# Patient Record
Sex: Female | Born: 1950
Health system: Southern US, Community
[De-identification: ages and names within clinical notes are randomized; demographics above are authoritative.]

## PROBLEM LIST (undated history)

## (undated) DIAGNOSIS — M1990 Unspecified osteoarthritis, unspecified site: Secondary | ICD-10-CM

## (undated) DIAGNOSIS — Z87440 Personal history of urinary (tract) infections: Secondary | ICD-10-CM

## (undated) DIAGNOSIS — Z87442 Personal history of urinary calculi: Secondary | ICD-10-CM

## (undated) DIAGNOSIS — Z1371 Encounter for nonprocreative screening for genetic disease carrier status: Secondary | ICD-10-CM

## (undated) DIAGNOSIS — C801 Malignant (primary) neoplasm, unspecified: Secondary | ICD-10-CM

## (undated) HISTORY — PX: REPAIR, ANAL SPHINCTER: SHX5192

## (undated) HISTORY — PX: BLADDER SUSPENSION: SHX72

## (undated) HISTORY — PX: HYSTERECTOMY: SHX81

## (undated) HISTORY — PX: BREAST BIOPSY: SHX20

## (undated) HISTORY — PX: BREAST CYST ASPIRATION: SHX578

## (undated) HISTORY — PX: COLONOSCOPY: SHX174

---

## 1997-05-25 ENCOUNTER — Ambulatory Visit (HOSPITAL_COMMUNITY): Admission: RE | Admit: 1997-05-25 | Discharge: 1997-05-25 | Payer: Self-pay | Admitting: Family Medicine

## 1997-06-07 ENCOUNTER — Ambulatory Visit (HOSPITAL_COMMUNITY): Admission: RE | Admit: 1997-06-07 | Discharge: 1997-06-07 | Payer: Self-pay | Admitting: Family Medicine

## 1998-02-03 ENCOUNTER — Ambulatory Visit (HOSPITAL_COMMUNITY): Admission: RE | Admit: 1998-02-03 | Discharge: 1998-02-03 | Payer: Self-pay | Admitting: Family Medicine

## 1999-08-22 ENCOUNTER — Encounter (INDEPENDENT_AMBULATORY_CARE_PROVIDER_SITE_OTHER): Payer: Self-pay | Admitting: Specialist

## 1999-08-22 ENCOUNTER — Other Ambulatory Visit: Admission: RE | Admit: 1999-08-22 | Discharge: 1999-08-22 | Payer: Self-pay | Admitting: Obstetrics and Gynecology

## 1999-08-29 ENCOUNTER — Encounter: Payer: Self-pay | Admitting: Family Medicine

## 1999-08-29 ENCOUNTER — Ambulatory Visit (HOSPITAL_COMMUNITY): Admission: RE | Admit: 1999-08-29 | Discharge: 1999-08-29 | Payer: Self-pay | Admitting: Family Medicine

## 1999-12-29 ENCOUNTER — Encounter (INDEPENDENT_AMBULATORY_CARE_PROVIDER_SITE_OTHER): Payer: Self-pay

## 1999-12-29 ENCOUNTER — Ambulatory Visit (HOSPITAL_BASED_OUTPATIENT_CLINIC_OR_DEPARTMENT_OTHER): Admission: RE | Admit: 1999-12-29 | Discharge: 1999-12-29 | Payer: Self-pay | Admitting: *Deleted

## 2000-09-05 ENCOUNTER — Ambulatory Visit (HOSPITAL_COMMUNITY): Admission: RE | Admit: 2000-09-05 | Discharge: 2000-09-05 | Payer: Self-pay | Admitting: Pediatrics

## 2000-09-05 ENCOUNTER — Encounter: Payer: Self-pay | Admitting: Family Medicine

## 2000-10-02 ENCOUNTER — Other Ambulatory Visit: Admission: RE | Admit: 2000-10-02 | Discharge: 2000-10-02 | Payer: Self-pay | Admitting: Obstetrics and Gynecology

## 2001-10-01 ENCOUNTER — Other Ambulatory Visit: Admission: RE | Admit: 2001-10-01 | Discharge: 2001-10-01 | Payer: Self-pay | Admitting: Obstetrics and Gynecology

## 2001-10-08 ENCOUNTER — Encounter: Payer: Self-pay | Admitting: Obstetrics and Gynecology

## 2001-10-08 ENCOUNTER — Ambulatory Visit (HOSPITAL_COMMUNITY): Admission: RE | Admit: 2001-10-08 | Discharge: 2001-10-08 | Payer: Self-pay | Admitting: Obstetrics and Gynecology

## 2002-11-03 ENCOUNTER — Ambulatory Visit (HOSPITAL_COMMUNITY): Admission: RE | Admit: 2002-11-03 | Discharge: 2002-11-03 | Payer: Self-pay | Admitting: Obstetrics and Gynecology

## 2002-11-03 ENCOUNTER — Encounter: Payer: Self-pay | Admitting: Obstetrics and Gynecology

## 2002-11-06 ENCOUNTER — Encounter: Admission: RE | Admit: 2002-11-06 | Discharge: 2002-11-06 | Payer: Self-pay | Admitting: Obstetrics and Gynecology

## 2002-11-06 ENCOUNTER — Encounter: Payer: Self-pay | Admitting: Obstetrics and Gynecology

## 2002-11-19 ENCOUNTER — Other Ambulatory Visit: Admission: RE | Admit: 2002-11-19 | Discharge: 2002-11-19 | Payer: Self-pay | Admitting: Obstetrics and Gynecology

## 2003-03-15 ENCOUNTER — Other Ambulatory Visit: Admission: RE | Admit: 2003-03-15 | Discharge: 2003-03-15 | Payer: Self-pay | Admitting: Obstetrics and Gynecology

## 2003-04-20 ENCOUNTER — Encounter: Admission: RE | Admit: 2003-04-20 | Discharge: 2003-04-20 | Payer: Self-pay | Admitting: Obstetrics and Gynecology

## 2003-11-26 ENCOUNTER — Other Ambulatory Visit: Admission: RE | Admit: 2003-11-26 | Discharge: 2003-11-26 | Payer: Self-pay | Admitting: Obstetrics and Gynecology

## 2003-12-01 ENCOUNTER — Encounter: Admission: RE | Admit: 2003-12-01 | Discharge: 2003-12-01 | Payer: Self-pay | Admitting: Obstetrics and Gynecology

## 2004-12-08 ENCOUNTER — Encounter: Admission: RE | Admit: 2004-12-08 | Discharge: 2004-12-08 | Payer: Self-pay | Admitting: Family Medicine

## 2005-01-08 ENCOUNTER — Other Ambulatory Visit: Admission: RE | Admit: 2005-01-08 | Discharge: 2005-01-08 | Payer: Self-pay | Admitting: Obstetrics and Gynecology

## 2005-01-19 ENCOUNTER — Encounter: Admission: RE | Admit: 2005-01-19 | Discharge: 2005-01-19 | Payer: Self-pay | Admitting: Obstetrics and Gynecology

## 2005-01-20 ENCOUNTER — Emergency Department (HOSPITAL_COMMUNITY): Admission: EM | Admit: 2005-01-20 | Discharge: 2005-01-20 | Payer: Self-pay | Admitting: Emergency Medicine

## 2005-12-11 ENCOUNTER — Encounter: Admission: RE | Admit: 2005-12-11 | Discharge: 2005-12-11 | Payer: Self-pay | Admitting: Family Medicine

## 2006-02-07 ENCOUNTER — Other Ambulatory Visit: Admission: RE | Admit: 2006-02-07 | Discharge: 2006-02-07 | Payer: Self-pay | Admitting: Obstetrics and Gynecology

## 2006-12-19 ENCOUNTER — Encounter: Admission: RE | Admit: 2006-12-19 | Discharge: 2006-12-19 | Payer: Self-pay | Admitting: Obstetrics and Gynecology

## 2007-01-01 ENCOUNTER — Encounter: Admission: RE | Admit: 2007-01-01 | Discharge: 2007-01-01 | Payer: Self-pay | Admitting: Obstetrics & Gynecology

## 2007-04-25 ENCOUNTER — Other Ambulatory Visit: Admission: RE | Admit: 2007-04-25 | Discharge: 2007-04-25 | Payer: Self-pay | Admitting: Obstetrics & Gynecology

## 2007-05-13 ENCOUNTER — Encounter: Admission: RE | Admit: 2007-05-13 | Discharge: 2007-05-13 | Payer: Self-pay | Admitting: Obstetrics & Gynecology

## 2007-07-17 ENCOUNTER — Encounter: Admission: RE | Admit: 2007-07-17 | Discharge: 2007-07-17 | Payer: Self-pay | Admitting: Obstetrics and Gynecology

## 2008-01-26 ENCOUNTER — Encounter: Admission: RE | Admit: 2008-01-26 | Discharge: 2008-01-26 | Payer: Self-pay | Admitting: Obstetrics and Gynecology

## 2008-02-04 ENCOUNTER — Encounter: Admission: RE | Admit: 2008-02-04 | Discharge: 2008-02-04 | Payer: Self-pay | Admitting: Obstetrics and Gynecology

## 2008-06-01 ENCOUNTER — Other Ambulatory Visit: Admission: RE | Admit: 2008-06-01 | Discharge: 2008-06-01 | Payer: Self-pay | Admitting: Obstetrics and Gynecology

## 2008-06-04 ENCOUNTER — Encounter: Admission: RE | Admit: 2008-06-04 | Discharge: 2008-06-04 | Payer: Self-pay | Admitting: Obstetrics & Gynecology

## 2009-07-25 ENCOUNTER — Encounter: Admission: RE | Admit: 2009-07-25 | Discharge: 2009-07-25 | Payer: Self-pay | Admitting: Obstetrics & Gynecology

## 2010-05-07 ENCOUNTER — Encounter: Payer: Self-pay | Admitting: Obstetrics and Gynecology

## 2010-05-08 ENCOUNTER — Encounter: Payer: Self-pay | Admitting: Obstetrics and Gynecology

## 2010-08-03 ENCOUNTER — Other Ambulatory Visit: Payer: Self-pay | Admitting: Obstetrics and Gynecology

## 2010-08-03 DIAGNOSIS — Z1231 Encounter for screening mammogram for malignant neoplasm of breast: Secondary | ICD-10-CM

## 2010-08-11 ENCOUNTER — Ambulatory Visit
Admission: RE | Admit: 2010-08-11 | Discharge: 2010-08-11 | Disposition: A | Discharge status: Home / Self Care | Payer: BC Managed Care – PPO | Source: Ambulatory Visit | Attending: Obstetrics and Gynecology | Admitting: Obstetrics and Gynecology

## 2010-08-11 DIAGNOSIS — Z1231 Encounter for screening mammogram for malignant neoplasm of breast: Secondary | ICD-10-CM

## 2011-07-25 ENCOUNTER — Other Ambulatory Visit: Payer: Self-pay | Admitting: Obstetrics and Gynecology

## 2011-07-25 DIAGNOSIS — Z1231 Encounter for screening mammogram for malignant neoplasm of breast: Secondary | ICD-10-CM

## 2011-08-13 ENCOUNTER — Ambulatory Visit
Admission: RE | Admit: 2011-08-13 | Discharge: 2011-08-13 | Disposition: A | Discharge status: Home / Self Care | Payer: BC Managed Care – PPO | Source: Ambulatory Visit | Attending: Obstetrics and Gynecology | Admitting: Obstetrics and Gynecology

## 2011-08-13 DIAGNOSIS — Z1231 Encounter for screening mammogram for malignant neoplasm of breast: Secondary | ICD-10-CM

## 2011-09-03 ENCOUNTER — Encounter: Payer: Self-pay | Admitting: Internal Medicine

## 2011-09-17 ENCOUNTER — Encounter (INDEPENDENT_AMBULATORY_CARE_PROVIDER_SITE_OTHER): Payer: BC Managed Care – PPO | Admitting: Ophthalmology

## 2011-09-17 DIAGNOSIS — H43819 Vitreous degeneration, unspecified eye: Secondary | ICD-10-CM

## 2011-09-17 DIAGNOSIS — H2510 Age-related nuclear cataract, unspecified eye: Secondary | ICD-10-CM

## 2012-07-24 ENCOUNTER — Encounter: Payer: Self-pay | Admitting: Internal Medicine

## 2013-09-17 ENCOUNTER — Other Ambulatory Visit: Payer: Self-pay | Admitting: Family Medicine

## 2013-09-17 DIAGNOSIS — N6320 Unspecified lump in the left breast, unspecified quadrant: Secondary | ICD-10-CM

## 2013-09-17 DIAGNOSIS — R921 Mammographic calcification found on diagnostic imaging of breast: Secondary | ICD-10-CM

## 2013-10-08 ENCOUNTER — Ambulatory Visit
Admission: RE | Admit: 2013-10-08 | Discharge: 2013-10-08 | Disposition: A | Discharge status: Home / Self Care | Payer: 59 | Source: Ambulatory Visit | Attending: Family Medicine | Admitting: Family Medicine

## 2013-10-08 DIAGNOSIS — R921 Mammographic calcification found on diagnostic imaging of breast: Secondary | ICD-10-CM

## 2013-10-08 DIAGNOSIS — N6320 Unspecified lump in the left breast, unspecified quadrant: Secondary | ICD-10-CM

## 2015-10-31 ENCOUNTER — Telehealth: Payer: Self-pay | Admitting: Hematology

## 2015-10-31 NOTE — Telephone Encounter (Signed)
Pt confirmed appt, completed intake, reviewed new insurance and was reminded to bring in all new cards

## 2015-11-01 ENCOUNTER — Encounter: Payer: Self-pay | Admitting: Hematology

## 2015-11-07 ENCOUNTER — Ambulatory Visit (HOSPITAL_BASED_OUTPATIENT_CLINIC_OR_DEPARTMENT_OTHER): Payer: 59 | Admitting: Hematology

## 2015-11-07 ENCOUNTER — Ambulatory Visit (HOSPITAL_BASED_OUTPATIENT_CLINIC_OR_DEPARTMENT_OTHER): Payer: 59

## 2015-11-07 ENCOUNTER — Encounter: Payer: Self-pay | Admitting: Hematology

## 2015-11-07 ENCOUNTER — Telehealth: Payer: Self-pay | Admitting: Hematology

## 2015-11-07 VITALS — BP 118/54 | HR 61 | Temp 98.6°F | Resp 18 | Wt 126.1 lb

## 2015-11-07 DIAGNOSIS — R161 Splenomegaly, not elsewhere classified: Secondary | ICD-10-CM

## 2015-11-07 DIAGNOSIS — Z803 Family history of malignant neoplasm of breast: Secondary | ICD-10-CM

## 2015-11-07 DIAGNOSIS — N83202 Unspecified ovarian cyst, left side: Secondary | ICD-10-CM | POA: Diagnosis not present

## 2015-11-07 LAB — CBC & DIFF AND RETIC
BASO%: 0.7 % (ref 0.0–2.0)
Basophils Absolute: 0 10*3/uL (ref 0.0–0.1)
EOS%: 1.5 % (ref 0.0–7.0)
Eosinophils Absolute: 0.1 10*3/uL (ref 0.0–0.5)
HCT: 43.7 % (ref 34.8–46.6)
HGB: 14.4 g/dL (ref 11.6–15.9)
Immature Retic Fract: 5.6 % (ref 1.60–10.00)
LYMPH#: 2.3 10*3/uL (ref 0.9–3.3)
LYMPH%: 43.1 % (ref 14.0–49.7)
MCH: 30.2 pg (ref 25.1–34.0)
MCHC: 32.9 g/dL (ref 31.5–36.0)
MCV: 91.8 fL (ref 79.5–101.0)
MONO#: 0.2 10*3/uL (ref 0.1–0.9)
MONO%: 3.6 % (ref 0.0–14.0)
NEUT#: 2.7 10*3/uL (ref 1.5–6.5)
NEUT%: 51.1 % (ref 38.4–76.8)
PLATELETS: 123 10*3/uL — AB (ref 145–400)
RBC: 4.76 10*6/uL (ref 3.70–5.45)
RDW: 13.8 % (ref 11.2–14.5)
Retic %: 1.71 % (ref 0.70–2.10)
Retic Ct Abs: 81.4 10*3/uL (ref 33.70–90.70)
WBC: 5.3 10*3/uL (ref 3.9–10.3)

## 2015-11-07 LAB — COMPREHENSIVE METABOLIC PANEL
ALT: 17 U/L (ref 0–55)
ANION GAP: 11 meq/L (ref 3–11)
AST: 23 U/L (ref 5–34)
Albumin: 4.5 g/dL (ref 3.5–5.0)
Alkaline Phosphatase: 90 U/L (ref 40–150)
BILIRUBIN TOTAL: 0.7 mg/dL (ref 0.20–1.20)
BUN: 13.9 mg/dL (ref 7.0–26.0)
CHLORIDE: 105 meq/L (ref 98–109)
CO2: 25 meq/L (ref 22–29)
Calcium: 9.7 mg/dL (ref 8.4–10.4)
Creatinine: 0.9 mg/dL (ref 0.6–1.1)
EGFR: 70 mL/min/{1.73_m2} — AB (ref >90)
Glucose: 89 mg/dl (ref 70–140)
Potassium: 4 mEq/L (ref 3.5–5.1)
Sodium: 141 mEq/L (ref 136–145)
Total Protein: 7.4 g/dL (ref 6.4–8.3)

## 2015-11-07 LAB — TECHNOLOGIST REVIEW

## 2015-11-07 LAB — CHCC SMEAR

## 2015-11-07 LAB — LACTATE DEHYDROGENASE: LDH: 255 U/L — ABNORMAL HIGH (ref 125–245)

## 2015-11-07 NOTE — Progress Notes (Signed)
[name]    HEMATOLOGY/ONCOLOGY CONSULTATION NOTE  Date of Service: 11/07/2015  No care team member to display  CHIEF COMPLAINTS/PURPOSE OF CONSULTATION:  Splenomegaly  HISTORY OF PRESENTING ILLNESS:   Joanne Miller is a wonderful 65 y.o. female who has been self referred to [place] for evaluation and management of newly noted splenomegaly.  Patient does not have many chronic medical comorbidities. 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 She also visited [place], [place], [place] and a few other places last 4 years.  A couple months ago while still in [place] she noticed some fullness in the upper and left upper abdomen and had an ultrasound which showed splenomegaly with the spleen length of 16.6 cm with no overt focal splenic lesions. No overt abdominal adenopathy noted. No free fluid. Liver, gallbladder, pancreas, both kidneys loss of bowel were noted to be within normal limits. There was incidental finding of a 4.2 cm thin-walled unilocular left ovarian cyst with no solid components. Normal uterus and right ovary was seen.  Her blood counts done on 09/02/2015 showed normal hemoglobin of 14 with an MCV of 91, WBC count of 5.9k with normal differential. Platelet clumping was noted with reported count of 103k. Malaria parasite smear was done and no other parasites were noted.  MEDICAL HISTORY:   #1 dyslipidemia - not on any medications #2 GERD #3 hot flashes menopausal status was previously on Climara patch #4 history of sebaceous cysts. #5 history of recent left breast cysts - aspirated by ultrasound guidance and noted to be negative for cancer. #6 family history of breast cancer in her sister when she was in her 22s. No genetic testing done at that time.  SURGICAL HISTORY: #1 breast cysts 2 aspiration on the left side . #2 root canal treatments #3 sebaceous cyst excision   L HISTORY: Social History    Social History  . Marital status: Married    Spouse name: N/A  . Number of children: N/A  . Years of education: N/A   Occupational History  . Not on file.   Social History Main Topics  . Smoking status: Not on file  . Smokeless tobacco: Not on file  . Alcohol use Not on file  . Drug use: Unknown  . Sexual activity: Not on file   Other Topics Concern  . Not on file   Social History Narrative  . No narrative on file    FAMILY HISTORY: No family history on file.  ALLERGIES:  is allergic to codeine.  MEDICATIONS:  Current Outpatient Prescriptions  Medication Sig Dispense Refill  . aspirin 81 MG EC tablet Take 81 mg by mouth daily. Swallow whole.    . Cyanocobalamin (VITAMIN B12 PO) Take by mouth.    . Misc Natural Products (TART CHERRY ADVANCED PO) Take by mouth.    . Omega-3 Fatty Acids (FISH OIL) 1200 MG CAPS Take by mouth.    [name] VITAMIN E PO Take by mouth.     No current facility-administered medications for this visit.     REVIEW OF SYSTEMS:    10 Point review of Systems was done is negative except as noted above.  PHYSICAL EXAMINATION: ECOG PERFORMANCE STATUS: 0 - Asymptomatic  . Vitals:   11/07/15 1301  BP: (!) 118/54  Pulse: 61  Resp: 18  Temp: 98.6 F (37 C)   Filed Weights   11/07/15 1301  Weight:  126 lb 1.6 oz (57.2 kg)   .There is no height or weight on file to calculate BMI.  GENERAL:alert, in no acute distress and comfortable SKIN: skin color, texture, turgor are normal, no rashes or significant lesions EYES: normal, conjunctiva are pink and non-injected, sclera clear OROPHARYNX:no exudate, no erythema and lips, buccal mucosa, and tongue normal  NECK: supple, no JVD, thyroid normal size, non-tender, without nodularity LYMPH:  no palpable lymphadenopathy in the cervical, axillary or inguinal LUNGS: clear to auscultation with normal respiratory effort HEART: regular rate & rhythm,  no murmurs and no lower extremity edema ABDOMEN: abdomen  soft, non-tender, normoactive bowel sounds , No palpable hepatomegaly . Spleen palpable about 2 cm under the left costal margin in the midclavicular line . Musculoskeletal: no cyanosis of digits and no clubbing  PSYCH: alert & oriented x 3 with fluent speech NEURO: no focal motor/sensory deficits  LABORATORY DATA:  I have reviewed the data as listed  .No flowsheet data found.  .No flowsheet data found.   RADIOGRAPHIC STUDIES: I have personally reviewed the radiological images as listed and agreed with the findings in the report. No results found.  ASSESSMENT & PLAN:   65 year old female was traveling through [place] for the last 4 yrs now with   1) Moderate  splenomegaly at 16.6 cm based on ultrasound on 09/10/2015 in [place]. She had a malaria smear done which was negative . Currently has no fevers no chills no night sweats no weight loss no palpable lymphadenopathy . Reports no malaria or other known infections that she had while she was an [place] . Notes some arthritis in her hands but this has not been worked up to rule out inflammatory arthritis. Plan -CBC with differential and reticulocyte count, smear, LDH, myeloma panel  -HIV, hepatitis C, hepatitis B serologies -Patient is febrile might need repeat malaria smear and infectious disease evaluation . -Follow-up with primary care physician workup arthritis. -CT abdomen pelvis to evaluate splenomegaly and evaluated for focal splenic lesions, abdominal adenopathy or other abdominal pathology and to evaluate for liver disease .  2) left ovarian cyst - 4.2 cm . 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 CA-125 levels were 28.6 on the normal range of 0-35 . Alpha-fetoprotein was within normal limits and 1.8 . CEA level was 1.3 . -Follow-up with primary care physician at [facility] family practice . -We'll evaluate this further on her abdomen and pelvis . -We will likely need GYN evaluation .  3) family history of breast cancer .  Patient notes that her sister had breast cancer in her 41s . Has not had genetic testing . No other family history of breast cancer ovarian cancer pancreatic cancer or melanoma . She has had recent left breast cysts that were aspirated in [place] and per her report were noted to be benign . Plan -Would recommend repeat mammogram and ultrasound in 6 months with primary care physician. -And then continue yearly mammograms of that is negative . -She was recommended to her sister regarding any genetic testing results .  4) history of depression was previously on Lexapro currently not on any medications . Lost her husband 2009 throat cancer .  Return to care with Dr. Irene Limbo in one month with allow lab results and CT abdomen and pelvis .  All of the patients questions were answered with apparent satisfaction. The patient knows to call the clinic with any problems, questions or concerns.  I spent 55 minutes counseling the patient face to face. The  total time spent in the appointment was 60 minutes and more than 50% was on counseling and direct patient cares.    Sullivan Lone MD [place] AAHIVMS [facility] Hematology/Oncology Physician [facility]  (Office):       [phone] (Work cell):  [phone] (Fax):           [phone]  11/07/2015 1:09 PM

## 2015-11-07 NOTE — Progress Notes (Signed)
Pt contacted this RN regarding CT scan.  Pt stated she feels CT scan is "a little overboard" and she would like to see what the blood work shows first.  This RN stated that labs will not pick up on everything and that it would be ideal to have the CT scan, but that it is her choice if she feels she does not want it done at this time.  Pt stated she would like to see the results of blood work first "then go from there"  Dr. Irene Limbo informed of patient's decision.

## 2015-11-07 NOTE — Telephone Encounter (Signed)
Gave pt cal & avs °

## 2015-11-08 ENCOUNTER — Encounter: Payer: Self-pay | Admitting: *Deleted

## 2015-11-08 LAB — HIV ANTIBODY (ROUTINE TESTING W REFLEX): HIV Screen 4th Generation wRfx: NONREACTIVE

## 2015-11-08 LAB — C-REACTIVE PROTEIN: CRP: 2.2 mg/L (ref 0.0–4.9)

## 2015-11-08 LAB — HEPATITIS B CORE ANTIBODY, TOTAL: Hep B Core Ab, Tot: NEGATIVE

## 2015-11-08 LAB — HEPATITIS C ANTIBODY: Hep C Virus Ab: <0.1 s/co ratio (ref 0.0–0.9)

## 2015-11-08 LAB — SEDIMENTATION RATE: Sedimentation Rate-Westergren: 2 mm/hr (ref 0–40)

## 2015-11-08 LAB — HEPATITIS B SURFACE ANTIGEN: HBsAg Screen: NEGATIVE

## 2015-11-08 NOTE — Progress Notes (Signed)
Informed patient of recent lab results. Patient has a CT order pending.  Before patient schedules her CT scan, she would like to call her insurance company to see what the cost would be for this procedure. Patient states that she would call us back once she knows.

## 2015-11-09 LAB — MULTIPLE MYELOMA PANEL, SERUM
ALBUMIN SERPL ELPH-MCNC: 4 g/dL (ref 2.9–4.4)
ALPHA 1: 0.3 g/dL (ref 0.0–0.4)
ALPHA2 GLOB SERPL ELPH-MCNC: 0.6 g/dL (ref 0.4–1.0)
Albumin/Glob SerPl: 1.7 (ref 0.7–1.7)
B-Globulin SerPl Elph-Mcnc: 1 g/dL (ref 0.7–1.3)
Gamma Glob SerPl Elph-Mcnc: 0.7 g/dL (ref 0.4–1.8)
Globulin, Total: 2.5 g/dL (ref 2.2–3.9)
IGA/IMMUNOGLOBULIN A, SERUM: 65 mg/dL — AB (ref 87–352)
IGG (IMMUNOGLOBIN G), SERUM: 617 mg/dL — AB (ref 700–1600)
IGM (IMMUNOGLOBIN M), SRM: 48 mg/dL (ref 26–217)
TOTAL PROTEIN: 6.5 g/dL (ref 6.0–8.5)

## 2015-12-06 ENCOUNTER — Other Ambulatory Visit: Payer: 59

## 2015-12-06 ENCOUNTER — Encounter: Payer: Self-pay | Admitting: Hematology

## 2015-12-06 ENCOUNTER — Ambulatory Visit (HOSPITAL_BASED_OUTPATIENT_CLINIC_OR_DEPARTMENT_OTHER): Payer: Commercial Managed Care - HMO | Admitting: Hematology

## 2015-12-06 VITALS — BP 110/63 | HR 63 | Temp 99.1°F | Resp 18 | Ht 62.0 in | Wt 125.0 lb

## 2015-12-06 DIAGNOSIS — R161 Splenomegaly, not elsewhere classified: Secondary | ICD-10-CM

## 2015-12-06 DIAGNOSIS — N83202 Unspecified ovarian cyst, left side: Secondary | ICD-10-CM | POA: Diagnosis not present

## 2015-12-06 DIAGNOSIS — Z803 Family history of malignant neoplasm of breast: Secondary | ICD-10-CM | POA: Diagnosis not present

## 2015-12-08 NOTE — Progress Notes (Signed)
[name]    HEMATOLOGY/ONCOLOGY CONSULTATION NOTE  Date of Service: 12/06/2015  No care team member to display  CHIEF COMPLAINTS/PURPOSE OF CONSULTATION:  Splenomegaly f/u to discuss results and further evaluation.  HISTORY OF PRESENTING ILLNESS:   Joanne Miller is a wonderful 65 y.o. female who has been self referred to [place] for evaluation and management of newly noted splenomegaly.  Patient does not have many chronic medical comorbidities. 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 She also visited [place], [place], [place] and a few other places last 4 years.  A couple months ago while still in [place] she noticed some fullness in the upper and left upper abdomen and had an ultrasound which showed splenomegaly with the spleen length of 16.6 cm with no overt focal splenic lesions. No overt abdominal adenopathy noted. No free fluid. Liver, gallbladder, pancreas, both kidneys loss of bowel were noted to be within normal limits. There was incidental finding of a 4.2 cm thin-walled unilocular left ovarian cyst with no solid components. Normal uterus and right ovary was seen.  Her blood counts done on 09/02/2015 showed normal hemoglobin of 14 with an MCV of 91, WBC count of 5.9k with normal differential. Platelet clumping was noted with reported count of 103k. Malaria parasite smear was done and no other parasites were noted.  INTERVAL HISTORY  Joanne Miller is here for follow-up of her splenomegaly to discuss the results of workup. She refused to get the CT of the abdomen to reevaluate spleen size and without focal lesions. She notes that this is on account of the copay and her personal decision to just monitor things and not pursue additional workup. She has no abdominal pain or other new symptoms at this time. No fevers no chills no night sweats no weight loss. She reports that she does not want any additional workup to figure  out the status or cause of her splenomegaly and if she has any new symptoms. She reports that she shall be getting a new primary care physician that she wants to follow-up with.  MEDICAL HISTORY:   #1 dyslipidemia - not on any medications #2 GERD #3 hot flashes menopausal status was previously on Climara patch #4 history of sebaceous cysts. #5 history of recent left breast cysts - aspirated by ultrasound guidance and noted to be negative for cancer. #6 family history of breast cancer in her sister when she was in her 32s. No genetic testing done at that time.  SURGICAL HISTORY: #1 breast cysts 2 aspiration on the left side . #2 root canal treatments #3 sebaceous cyst excision   L HISTORY: Social History   Social History  . Marital status: Married    Spouse name: N/A  . Number of children: N/A  . Years of education: N/A   Occupational History  . Not on file.   Social History Main Topics  . Smoking status: Never Smoker  . Smokeless tobacco: Never Used  . Alcohol use No  . Drug use: No  . Sexual activity: Not on file   Other Topics Concern  . Not on file   Social History Narrative  . No narrative on file    FAMILY HISTORY: No family history on file.  ALLERGIES:  is allergic to codeine.  MEDICATIONS:  Current Outpatient Prescriptions  Medication Sig Dispense Refill  . aspirin 81 MG EC tablet Take 81 mg by mouth  daily. Swallow whole.    . Cyanocobalamin (VITAMIN B12 PO) Take by mouth.    . Misc Natural Products (TART CHERRY ADVANCED PO) Take by mouth.    . Omega-3 Fatty Acids (FISH OIL) 1200 MG CAPS Take by mouth.    [name] VITAMIN E PO Take by mouth.     No current facility-administered medications for this visit.     REVIEW OF SYSTEMS:    10 Point review of Systems was done is negative except as noted above.  PHYSICAL EXAMINATION: ECOG PERFORMANCE STATUS: 0 - Asymptomatic  . Vitals:   12/06/15 1518  BP: 110/63  Pulse: 63  Resp: 18  Temp: 99.1 F (37.3  C)   Filed Weights   12/06/15 1518  Weight: 125 lb (56.7 kg)   .Body mass index is 22.86 kg/m.  GENERAL:alert, in no acute distress and comfortable SKIN: skin color, texture, turgor are normal, no rashes or significant lesions EYES: normal, conjunctiva are pink and non-injected, sclera clear OROPHARYNX:no exudate, no erythema and lips, buccal mucosa, and tongue normal  NECK: supple, no JVD, thyroid normal size, non-tender, without nodularity LYMPH:  no palpable lymphadenopathy in the cervical, axillary or inguinal LUNGS: clear to auscultation with normal respiratory effort HEART: regular rate & rhythm,  no murmurs and no lower extremity edema ABDOMEN: abdomen soft, non-tender, normoactive bowel sounds , No palpable hepatomegaly . Spleen palpable about 2 cm under the left costal margin in the midclavicular line . Musculoskeletal: no cyanosis of digits and no clubbing  PSYCH: alert & oriented x 3 with fluent speech NEURO: no focal motor/sensory deficits  LABORATORY DATA:  I have reviewed the data as listed  . CBC Latest Ref Rng & Units 11/07/2015  WBC 3.9 - 10.3 10e3/uL 5.3  Hemoglobin 11.6 - 15.9 g/dL 14.4  Hematocrit 34.8 - 46.6 % 43.7  Platelets 145 - 400 10e3/uL 123(L)   . CBC    Component Value Date/Time   WBC 5.3 11/07/2015 1446   RBC 4.76 11/07/2015 1446   HGB 14.4 11/07/2015 1446   HCT 43.7 11/07/2015 1446   PLT 123 (L) 11/07/2015 1446   MCV 91.8 11/07/2015 1446   MCH 30.2 11/07/2015 1446   MCHC 32.9 11/07/2015 1446   RDW 13.8 11/07/2015 1446   LYMPHSABS 2.3 11/07/2015 1446   MONOABS 0.2 11/07/2015 1446   EOSABS 0.1 11/07/2015 1446   BASOSABS 0.0 11/07/2015 1446    . CMP Latest Ref Rng & Units 11/07/2015 11/07/2015  Glucose 70 - 140 mg/dl 89 -  BUN 7.0 - 26.0 mg/dL 13.9 -  Creatinine 0.6 - 1.1 mg/dL 0.9 -  Sodium 136 - 145 mEq/L 141 -  Potassium 3.5 - 5.1 mEq/L 4.0 -  CO2 22 - 29 mEq/L 25 -  Calcium 8.4 - 10.4 mg/dL 9.7 -  Total Protein 6.0 - 8.5 g/dL  7.4 6.5  Total Bilirubin 0.20 - 1.20 mg/dL 0.70 -  Alkaline Phos 40 - 150 U/L 90 -  AST 5 - 34 U/L 23 -  ALT 0 - 55 U/L 17 -   Component     Latest Ref Rng & Units 11/07/2015  WBC     3.9 - 10.3 10e3/uL 5.3  NEUT#     1.5 - 6.5 10e3/uL 2.7  Hemoglobin     11.6 - 15.9 g/dL 14.4  HCT     34.8 - 46.6 % 43.7  Platelets     145 - 400 10e3/uL 123 (L)  MCV     79.5 -  101.0 fL 91.8  MCH     25.1 - 34.0 pg 30.2  MCHC     31.5 - 36.0 g/dL 32.9  RBC     3.70 - 5.45 10e6/uL 4.76  RDW     11.2 - 14.5 % 13.8  lymph#     0.9 - 3.3 10e3/uL 2.3  MONO#     0.1 - 0.9 10e3/uL 0.2  Eosinophils Absolute     0.0 - 0.5 10e3/uL 0.1  Basophils Absolute     0.0 - 0.1 10e3/uL 0.0  NEUT%     38.4 - 76.8 % 51.1  LYMPH%     14.0 - 49.7 % 43.1  MONO%     0.0 - 14.0 % 3.6  EOS%     0.0 - 7.0 % 1.5  BASO%     0.0 - 2.0 % 0.7  Retic %     0.70 - 2.10 % 1.71  Retic Ct Abs     33.70 - 90.70 10e3/uL 81.40  Immature Retic Fract     1.60 - 10.00 % 5.60  Sodium     136 - 145 mEq/L 141  Potassium     3.5 - 5.1 mEq/L 4.0  Chloride     98 - 109 mEq/L 105  CO2     22 - 29 mEq/L 25  Glucose     70 - 140 mg/dl 89  BUN     7.0 - 26.0 mg/dL 13.9  Creatinine     0.6 - 1.1 mg/dL 0.9  Total Bilirubin     0.20 - 1.20 mg/dL 0.70  Alkaline Phosphatase     40 - 150 U/L 90  AST     5 - 34 U/L 23  ALT     0 - 55 U/L 17  Total Protein     6.4 - 8.3 g/dL 7.4  Albumin     3.5 - 5.0 g/dL 4.5  Calcium     8.4 - 10.4 mg/dL 9.7  Anion gap     3 - 11 mEq/L 11  EGFR     >90 ml/min/1.73 m2 70 (L)  IgG (Immunoglobin G), Serum     700 - 1,600 mg/dL 617 (L)  IgA/Immunoglobulin A, Serum     87 - 352 mg/dL 65 (L)  IgM, Qn, Serum     26 - 217 mg/dL 48  Total Protein     6.0 - 8.5 g/dL 6.5  Albumin SerPl Elph-Mcnc     2.9 - 4.4 g/dL 4.0  Alpha 1     0.0 - 0.4 g/dL 0.3  Alpha2 Glob SerPl Elph-Mcnc     0.4 - 1.0 g/dL 0.6  B-Globulin SerPl Elph-Mcnc     0.7 - 1.3 g/dL 1.0  Gamma Glob SerPl  Elph-Mcnc     0.4 - 1.8 g/dL 0.7  M Protein SerPl Elph-Mcnc     Not Observed g/dL Not Observed  Globulin, Total     2.2 - 3.9 g/dL 2.5  Albumin/Glob SerPl     0.7 - 1.7 1.7  IFE 1      Comment  Please Note (HCV):      Comment  LDH     125 - 245 U/L 255 (H)  Hep C Virus Ab     0.0 - 0.9 s/co ratio <0.1  HIV     Non Reactive Non Reactive  Hep B Core Ab, Tot     Negative Negative  Hepatitis B Surface Ag  Negative Negative  Smear Result      Smear Available    RADIOGRAPHIC STUDIES: I have personally reviewed the radiological images as listed and agreed with the findings in the report. No results found.  ASSESSMENT & PLAN:   65 year old female was traveling through [place] for the last 4 yrs now with   1) Moderate  splenomegaly at 16.6 cm based on ultrasound on 09/10/2015 in [place]. She had a malaria smear done which was negative . Currently has no fevers no chills no night sweats no weight loss no palpable lymphadenopathy . Reports no malaria or other known infections that she had while she was an [place] . Notes some arthritis in her hands but this has not been worked up to rule out inflammatory arthritis.  Peripheral blood smear shows normocytic red cells, no increased schistocytes. No abnormal W BC forms or blasts .  HIV, hepatitis C and hepatitis B serologies unrevealing   SPEP shows no M spike and negative IFE.  Plan -Patient was recommended CT abdomen pelvis to evaluate splenomegaly and evaluated for focal splenic lesions, abdominal adenopathy or other abdominal pathology and to evaluate for liver disease --after understanding the importance of this she declines this. -She was in so for repeat ultrasound of the abdomen which might be cheaper from a cost considerations standpoint to check for resolution of splenomegaly and to reevaluate her ovarian cyst but she declined this as well. -She prefers to follow-up with her primary care physician.  2) left ovarian cyst -  4.2 cm . 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 CA-125 levels were 28.6 on the normal range of 0-35 . Alpha-fetoprotein was within normal limits and 1.8 . CEA level was 1.3 . -Follow-up with primary care physician at [facility] family practice . -We ordered a CT of abdomen and pelvis - patient refused this and this was canceled . -We will likely need GYN evaluation -patient wants to coordinate this through her primary care physician.  3) family history of breast cancer . Patient notes that her sister had breast cancer in her 20s . Has not had genetic testing . No other family history of breast cancer ovarian cancer pancreatic cancer or melanoma . She has had recent left breast cysts that were aspirated in [place] and per her report were noted to be benign . Plan -Would recommend repeat mammogram and ultrasound in 6 months with primary care physician. -And then continue yearly mammograms of that is negative . -She was recommended to f/u with her sister regarding any genetic testing results .  4) history of depression was previously on Lexapro currently not on any medications . Lost her husband 2009 throat cancer .  Continue follow-up with primary care physician as per patient's request for further evaluation and management of her splenomegaly, ovarian cyst and other medical issues.  Return to care with Dr. Irene Limbo on an as-needed basis.  All of the patients questions were answered with apparent satisfaction. The patient knows to call the clinic with any problems, questions or concerns.  I spent 15 minutes counseling the patient face to face. The total time spent in the appointment was 20 minutes and more than 50% was on counseling and direct patient cares.    Sullivan Lone MD [place] AAHIVMS [facility] Hematology/Oncology Physician [facility]  (Office):       [phone] (Work cell):  [phone] (Fax):           [phone]

## 2016-01-03 ENCOUNTER — Other Ambulatory Visit: Payer: Self-pay | Admitting: Family Medicine

## 2016-01-03 DIAGNOSIS — Z136 Encounter for screening for cardiovascular disorders: Secondary | ICD-10-CM | POA: Diagnosis not present

## 2016-01-03 DIAGNOSIS — Z23 Encounter for immunization: Secondary | ICD-10-CM | POA: Diagnosis not present

## 2016-01-03 DIAGNOSIS — R229 Localized swelling, mass and lump, unspecified: Secondary | ICD-10-CM | POA: Diagnosis not present

## 2016-01-03 DIAGNOSIS — R232 Flushing: Secondary | ICD-10-CM | POA: Diagnosis not present

## 2016-01-03 DIAGNOSIS — Z1231 Encounter for screening mammogram for malignant neoplasm of breast: Secondary | ICD-10-CM

## 2016-01-03 DIAGNOSIS — Z0000 Encounter for general adult medical examination without abnormal findings: Secondary | ICD-10-CM | POA: Diagnosis not present

## 2016-01-03 DIAGNOSIS — Z1211 Encounter for screening for malignant neoplasm of colon: Secondary | ICD-10-CM | POA: Diagnosis not present

## 2016-01-03 DIAGNOSIS — R161 Splenomegaly, not elsewhere classified: Secondary | ICD-10-CM | POA: Diagnosis not present

## 2016-01-13 ENCOUNTER — Ambulatory Visit
Admission: RE | Admit: 2016-01-13 | Discharge: 2016-01-13 | Disposition: A | Discharge status: Home / Self Care | Payer: Commercial Managed Care - HMO | Source: Ambulatory Visit | Attending: Family Medicine | Admitting: Family Medicine

## 2016-01-13 DIAGNOSIS — Z1231 Encounter for screening mammogram for malignant neoplasm of breast: Secondary | ICD-10-CM

## 2016-02-07 ENCOUNTER — Encounter: Payer: Self-pay | Admitting: Family Medicine

## 2016-04-16 DIAGNOSIS — C801 Malignant (primary) neoplasm, unspecified: Secondary | ICD-10-CM

## 2016-04-16 HISTORY — DX: Malignant (primary) neoplasm, unspecified: C80.1

## 2016-11-07 DIAGNOSIS — M6530 Trigger finger, unspecified finger: Secondary | ICD-10-CM | POA: Diagnosis not present

## 2016-11-07 DIAGNOSIS — R161 Splenomegaly, not elsewhere classified: Secondary | ICD-10-CM | POA: Diagnosis not present

## 2016-11-07 DIAGNOSIS — Z1211 Encounter for screening for malignant neoplasm of colon: Secondary | ICD-10-CM | POA: Diagnosis not present

## 2016-11-07 DIAGNOSIS — R232 Flushing: Secondary | ICD-10-CM | POA: Diagnosis not present

## 2016-11-13 ENCOUNTER — Telehealth: Payer: Self-pay | Admitting: *Deleted

## 2016-11-13 NOTE — Telephone Encounter (Signed)
FYI "Dr. Orland Mustard with [facility] in [place] needs to schedule F/U for this patient with provider she saw last year.  What is the providers name?  What fax number should lab results be sent?  Pltc count is low.  She is to follow up on an as needed basis."  Patient seen by Dr. Irene Limbo on 12-06-2015.  Provided [facility].I[facility]. Fax number [phone].  No further questions.

## 2016-11-14 NOTE — Telephone Encounter (Signed)
Again, why am I receiving this?

## 2016-11-15 NOTE — Telephone Encounter (Signed)
Left message on nurse triage line to be addressed. Pt to be seen with [place] regarding low plt count. Labs and any other information to be sent to [phone]. Message to be reviewed and responded to tomorrow.

## 2016-11-16 ENCOUNTER — Telehealth: Payer: Self-pay | Admitting: Hematology

## 2016-11-16 ENCOUNTER — Telehealth: Payer: Self-pay

## 2016-11-16 NOTE — Telephone Encounter (Signed)
Scheduled appt per sch message - left message with appt date and time . 

## 2016-11-16 NOTE — Telephone Encounter (Signed)
Trying to get pt scheduled for next available lab and 51min doctor f/u appt. Pt to call back by 4:30.

## 2016-11-16 NOTE — Telephone Encounter (Signed)
Scheduling message sent for pt to have LAB MO and EST PT 20 next available. Dr. Orland Mustard from [facility] at [place] would like for pt to be seen for thrombocytopenia f/u. Recent lab received Plt 121.

## 2016-11-16 NOTE — Telephone Encounter (Signed)
Left message on nurse line. Dr. Irene Limbo is following pt prn only. Wanting to know what has changed and why pt needs to be seen. Message left yesterday with no response. Labs were not received from [place].

## 2016-11-16 NOTE — Telephone Encounter (Signed)
Reason for visit thrombocytopenia and history of splenomegaly per Dr. Orland Mustard.

## 2016-11-20 ENCOUNTER — Ambulatory Visit (HOSPITAL_BASED_OUTPATIENT_CLINIC_OR_DEPARTMENT_OTHER): Payer: Medicare HMO | Admitting: Hematology

## 2016-11-20 ENCOUNTER — Encounter: Payer: Self-pay | Admitting: Hematology

## 2016-11-20 ENCOUNTER — Other Ambulatory Visit (HOSPITAL_COMMUNITY)
Admission: RE | Admit: 2016-11-20 | Discharge: 2016-11-20 | Disposition: A | Discharge status: Home / Self Care | Payer: Medicare HMO | Source: Ambulatory Visit | Attending: Hematology | Admitting: Hematology

## 2016-11-20 ENCOUNTER — Ambulatory Visit (HOSPITAL_BASED_OUTPATIENT_CLINIC_OR_DEPARTMENT_OTHER): Payer: Medicare HMO

## 2016-11-20 ENCOUNTER — Telehealth: Payer: Self-pay | Admitting: Hematology

## 2016-11-20 ENCOUNTER — Other Ambulatory Visit: Payer: Commercial Managed Care - HMO

## 2016-11-20 VITALS — BP 110/61 | HR 62 | Temp 99.2°F | Resp 18 | Ht 62.0 in | Wt 123.5 lb

## 2016-11-20 DIAGNOSIS — R161 Splenomegaly, not elsewhere classified: Secondary | ICD-10-CM | POA: Insufficient documentation

## 2016-11-20 DIAGNOSIS — N83202 Unspecified ovarian cyst, left side: Secondary | ICD-10-CM

## 2016-11-20 DIAGNOSIS — D696 Thrombocytopenia, unspecified: Secondary | ICD-10-CM

## 2016-11-20 DIAGNOSIS — Z803 Family history of malignant neoplasm of breast: Secondary | ICD-10-CM

## 2016-11-20 DIAGNOSIS — R140 Abdominal distension (gaseous): Secondary | ICD-10-CM

## 2016-11-20 DIAGNOSIS — R1012 Left upper quadrant pain: Secondary | ICD-10-CM

## 2016-11-20 DIAGNOSIS — D473 Essential (hemorrhagic) thrombocythemia: Secondary | ICD-10-CM | POA: Diagnosis not present

## 2016-11-20 LAB — CBC & DIFF AND RETIC
BASO%: 0.3 % (ref 0.0–2.0)
Basophils Absolute: 0 10*3/uL (ref 0.0–0.1)
EOS%: 1.3 % (ref 0.0–7.0)
Eosinophils Absolute: 0.1 10*3/uL (ref 0.0–0.5)
HEMATOCRIT: 43.7 % (ref 34.8–46.6)
HEMOGLOBIN: 14.5 g/dL (ref 11.6–15.9)
IMMATURE RETIC FRACT: 3.3 % (ref 1.60–10.00)
LYMPH#: 2.5 10*3/uL (ref 0.9–3.3)
LYMPH%: 40.7 % (ref 14.0–49.7)
MCH: 31.3 pg (ref 25.1–34.0)
MCHC: 33.2 g/dL (ref 31.5–36.0)
MCV: 94.2 fL (ref 79.5–101.0)
MONO#: 0.3 10*3/uL (ref 0.1–0.9)
MONO%: 4.8 % (ref 0.0–14.0)
NEUT#: 3.3 10*3/uL (ref 1.5–6.5)
NEUT%: 52.9 % (ref 38.4–76.8)
PLATELETS: 110 10*3/uL — AB (ref 145–400)
RBC: 4.64 10*6/uL (ref 3.70–5.45)
RDW: 13.5 % (ref 11.2–14.5)
RETIC CT ABS: 56.61 10*3/uL (ref 33.70–90.70)
Retic %: 1.22 % (ref 0.70–2.10)
WBC: 6.2 10*3/uL (ref 3.9–10.3)

## 2016-11-20 LAB — COMPREHENSIVE METABOLIC PANEL
ALBUMIN: 4.5 g/dL (ref 3.5–5.0)
ALK PHOS: 78 U/L (ref 40–150)
ALT: 12 U/L (ref 0–55)
ANION GAP: 10 meq/L (ref 3–11)
AST: 19 U/L (ref 5–34)
BILIRUBIN TOTAL: 0.87 mg/dL (ref 0.20–1.20)
BUN: 17.7 mg/dL (ref 7.0–26.0)
CALCIUM: 10.1 mg/dL (ref 8.4–10.4)
CO2: 25 meq/L (ref 22–29)
CREATININE: 0.8 mg/dL (ref 0.6–1.1)
Chloride: 105 mEq/L (ref 98–109)
EGFR: 74 mL/min/{1.73_m2} — AB (ref >90)
Glucose: 88 mg/dl (ref 70–140)
Potassium: 3.9 mEq/L (ref 3.5–5.1)
Sodium: 140 mEq/L (ref 136–145)
TOTAL PROTEIN: 7.1 g/dL (ref 6.4–8.3)

## 2016-11-20 LAB — LACTATE DEHYDROGENASE: LDH: 264 U/L — AB (ref 125–245)

## 2016-11-20 NOTE — Telephone Encounter (Signed)
Gave patient avs report and calendar for August. °

## 2016-11-20 NOTE — Patient Instructions (Signed)
Thank you for choosing [facility] to provide your oncology and hematology care.  To afford each patient quality time with our providers, please arrive 30 minutes before your scheduled appointment time.  If you arrive late for your appointment, you may be asked to reschedule.  We strive to give you quality time with our providers, and arriving late affects you and other patients whose appointments are after yours.   If you are a no show for multiple scheduled visits, you may be dismissed from the clinic at the providers discretion.    Again, thank you for choosing [facility], our hope is that these requests will decrease the amount of time that you wait before being seen by our physicians.  ______________________________________________________________________  Should you have questions after your visit to the [facility], please contact our office at [phone] between the hours of 8:30 and 4:30 p.m.    Voicemails left after 4:30p.m will not be returned until the following business day.    For prescription refill requests, please have your pharmacy contact us directly.  Please also try to allow 48 hours for prescription requests.    Please contact the scheduling department for questions regarding scheduling.  For scheduling of procedures such as PET scans, CT scans, MRI, Ultrasound, etc please contact central scheduling at [phone].    Resources For Cancer Patients and Caregivers:   Oncolink.org:  A wonderful resource for patients and healthcare providers for information regarding your disease, ways to tract your treatment, what to expect, etc.     American Cancer Society:  800-227-2345  Can help patients locate various types of support and financial assistance  Cancer Care: 1-800-813-HOPE (4673) Provides financial assistance, online support groups, medication/co-pay assistance.    [place] DSS:  336-641-3447 Where to apply for food  stamps, Medicaid, and utility assistance  Medicare Rights Center: 800-333-4114 Helps people with Medicare understand their rights and benefits, navigate the Medicare system, and secure the quality healthcare they deserve  SCAT: 336-333-6589 [place] [employer]'s shared-ride transportation service for eligible riders who have a disability that prevents them from riding the fixed route bus.    For additional information on assistance programs please contact our social worker:   Grier Hock/Abigail Elmore:  [phone]            

## 2016-11-23 ENCOUNTER — Telehealth: Payer: Self-pay

## 2016-11-23 DIAGNOSIS — Z1211 Encounter for screening for malignant neoplasm of colon: Secondary | ICD-10-CM | POA: Diagnosis not present

## 2016-11-23 LAB — PLASMODIUM SP. PCR: Plasmodium Sp. PCR: NEGATIVE

## 2016-11-23 NOTE — Progress Notes (Signed)
[name]    HEMATOLOGY/ONCOLOGY CONSULTATION NOTE  Date of Service: 11/20/2016  Patient Care Team: London Pepper, MD as PCP - General (Family Medicine)  CHIEF COMPLAINTS/PURPOSE OF CONSULTATION:  Splenomegaly f/u to discuss results and further evaluation.  HISTORY OF PRESENTING ILLNESS:   Joanne Miller is a wonderful 66 y.o. female who has been self referred to [place] for evaluation and management of newly noted splenomegaly.  Patient does not have many chronic medical comorbidities. 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 She also visited [place], [place], [place] and a few other places last 4 years.  A couple months ago while still in [place] she noticed some fullness in the upper and left upper abdomen and had an ultrasound which showed splenomegaly with the spleen length of 16.6 cm with no overt focal splenic lesions. No overt abdominal adenopathy noted. No free fluid. Liver, gallbladder, pancreas, both kidneys loss of bowel were noted to be within normal limits. There was incidental finding of a 4.2 cm thin-walled unilocular left ovarian cyst with no solid components. Normal uterus and right ovary was seen.  Her blood counts done on 09/02/2015 showed normal hemoglobin of 14 with an MCV of 91, WBC count of 5.9k with normal differential. Platelet clumping was noted with reported count of 103k. Malaria parasite smear was done and no other parasites were noted.  INTERVAL HISTORY  Ms [name] has been referred to [place] again after her last clinic visit about a year ago to be seen for thrombocytopenia and to follow-up on her history of splenomegaly. Her platelet counts are slightly lower at 110k. She notes some mild left upper quadrant abdominal discomfort and has clinically palpable splenomegaly. She previously had refused imaging study here but had an ultrasound and gone that had shown splenomegaly with a spleen size of 16.6  cm. She is now agreeable to further workup of her splenomegaly. Normal hemoglobin and WBC count. No fevers no chills no night sweats no unexpected weight loss area notes such she overall is feeling quite well.  MEDICAL HISTORY:   #1 dyslipidemia - not on any medications #2 GERD #3 hot flashes menopausal status was previously on [place]im[place] patch #4 history of sebaceous cysts. #5 history of recent left breast cysts - aspirated by ultrasound guidance and noted to be negative for cancer. #6 family history of breast cancer in her sister when she was in her 22s. No genetic testing done at that time.  SURGICAL HISTORY: #1 breast cysts 2 aspiration on the left side . #2 root canal treatments #3 sebaceous cyst excision   L HISTORY: Social History   Social History  . Marital status: Married    Spouse name: N/A  . Number of children: N/A  . Years of education: N/A   Occupational History  . Not on file.   Social History Main Topics  . Smoking status: Never Smoker  . Smokeless tobacco: Never Used  . Alcohol use No  . Drug use: No  . Sexual activity: Not on file   Other Topics Concern  . Not on file   Social History Narrative  . No narrative on file    FAMILY HISTORY: History reviewed. No pertinent family history.  ALLERGIES:  is allergic to codeine.  MEDICATIONS:  Current Outpatient Prescriptions  Medication Sig Dispense Refill  . aspirin 81 MG EC tablet Take 81 mg by mouth daily. Swallow whole.    [name]  Cyanocobalamin (VITAMIN B12 PO) Take by mouth.    . Misc Natural Products (TART CHERRY ADVANCED PO) Take by mouth.    . Omega-3 Fatty Acids (FISH OIL) 1200 MG CAPS Take by mouth.    [name] VITAMIN E PO Take by mouth.     No current facility-administered medications for this visit.     REVIEW OF SYSTEMS:    10 Point review of Systems was done is negative except as noted above.  PHYSICAL EXAMINATION: ECOG PERFORMANCE STATUS: 0 - Asymptomatic  . Vitals:   11/20/16 1359    BP: 110/61  Pulse: 62  Resp: 18  Temp: 99.2 F (37.3 C)  SpO2: 100%   Filed Weights   11/20/16 1359  Weight: 123 lb 8 oz (56 kg)   .Body mass index is 22.59 kg/m.  GENERAL:alert, in no acute distress and comfortable SKIN: skin color, texture, turgor are normal, no rashes or significant lesions EYES: normal, conjunctiva are pink and non-injected, sclera clear OROPHARYNX:no exudate, no erythema and lips, buccal mucosa, and tongue normal  NECK: supple, no JVD, thyroid normal size, non-tender, without nodularity LYMPH:  no palpable lymphadenopathy in the cervical, axillary or inguinal LUNGS: clear to auscultation with normal respiratory effort HEART: regular rate & rhythm,  no murmurs and no lower extremity edema ABDOMEN: abdomen soft, non-tender, normoactive bowel sounds , No palpable hepatomegaly . Spleen palpable about 2 cm under the left costal margin in the midclavicular line . Musculoskeletal: no cyanosis of digits and no clubbing  PSYCH: alert & oriented x 3 with fluent speech NEURO: no focal motor/sensory deficits  LABORATORY DATA:  I have reviewed the data as listed  . CBC Latest Ref Rng & Units 11/20/2016 11/07/2015  WBC 3.9 - 10.3 10e3/uL 6.2 5.3  Hemoglobin 11.6 - 15.9 g/dL 14.5 14.4  Hematocrit 34.8 - 46.6 % 43.7 43.7  Platelets 145 - 400 10e3/uL 110(L) 123(L)   . CBC    Component Value Date/Time   WBC 6.2 11/20/2016 1445   RBC 4.64 11/20/2016 1445   HGB 14.5 11/20/2016 1445   HCT 43.7 11/20/2016 1445   PLT 110 (L) 11/20/2016 1445   MCV 94.2 11/20/2016 1445   MCH 31.3 11/20/2016 1445   MCHC 33.2 11/20/2016 1445   RDW 13.5 11/20/2016 1445   LYMPHSABS 2.5 11/20/2016 1445   MONOABS 0.3 11/20/2016 1445   EOSABS 0.1 11/20/2016 1445   BASOSABS 0.0 11/20/2016 1445    . CMP Latest Ref Rng & Units 11/20/2016 11/07/2015 11/07/2015  Glucose 70 - 140 mg/dl 88 89 -  BUN 7.0 - 26.0 mg/dL 17.7 13.9 -  Creatinine 0.6 - 1.1 mg/dL 0.8 0.9 -  Sodium 136 - 145 mEq/L 140  141 -  Potassium 3.5 - 5.1 mEq/L 3.9 4.0 -  CO2 22 - 29 mEq/L 25 25 -  Calcium 8.4 - 10.4 mg/dL 10.1 9.7 -  Total Protein 6.4 - 8.3 g/dL 7.1 7.4 6.5  Total Bilirubin 0.20 - 1.20 mg/dL 0.87 0.70 -  Alkaline Phos 40 - 150 U/L 78 90 -  AST 5 - 34 U/L 19 23 -  ALT 0 - 55 U/L 12 17 -   Component     Latest Ref Rng & Units 11/07/2015  WBC     3.9 - 10.3 10e3/uL 5.3  NEUT#     1.5 - 6.5 10e3/uL 2.7  Hemoglobin     11.6 - 15.9 g/dL 14.4  HCT     34.8 - 46.6 % 43.7  Platelets  145 - 400 10e3/uL 123 (L)  MCV     79.5 - 101.0 fL 91.8  MCH     25.1 - 34.0 pg 30.2  MCHC     31.5 - 36.0 g/dL 32.9  RBC     3.70 - 5.45 10e6/uL 4.76  RDW     11.2 - 14.5 % 13.8  lymph#     0.9 - 3.3 10e3/uL 2.3  MONO#     0.1 - 0.9 10e3/uL 0.2  Eosinophils Absolute     0.0 - 0.5 10e3/uL 0.1  Basophils Absolute     0.0 - 0.1 10e3/uL 0.0  NEUT%     38.4 - 76.8 % 51.1  LYMPH%     14.0 - 49.7 % 43.1  MONO%     0.0 - 14.0 % 3.6  EOS%     0.0 - 7.0 % 1.5  BASO%     0.0 - 2.0 % 0.7  Retic %     0.70 - 2.10 % 1.71  Retic Ct Abs     33.70 - 90.70 10e3/uL 81.40  Immature Retic Fract     1.60 - 10.00 % 5.60  Sodium     136 - 145 mEq/L 141  Potassium     3.5 - 5.1 mEq/L 4.0  Chloride     98 - 109 mEq/L 105  CO2     22 - 29 mEq/L 25  Glucose     70 - 140 mg/dl 89  BUN     7.0 - 26.0 mg/dL 13.9  Creatinine     0.6 - 1.1 mg/dL 0.9  Total Bilirubin     0.20 - 1.20 mg/dL 0.70  Alkaline Phosphatase     40 - 150 U/L 90  AST     5 - 34 U/L 23  ALT     0 - 55 U/L 17  Total Protein     6.4 - 8.3 g/dL 7.4  Albumin     3.5 - 5.0 g/dL 4.5  Calcium     8.4 - 10.4 mg/dL 9.7  Anion gap     3 - 11 mEq/L 11  EGFR     >90 ml/min/1.73 m2 70 (L)  IgG (Immunoglobin G), Serum     700 - 1,600 mg/dL 617 (L)  IgA/Immunoglobulin A, Serum     87 - 352 mg/dL 65 (L)  IgM, Qn, Serum     26 - 217 mg/dL 48  Total Protein     6.0 - 8.5 g/dL 6.5  Albumin SerPl Elph-Mcnc     2.9 - 4.4 g/dL 4.0    Alpha 1     0.0 - 0.4 g/dL 0.3  Alpha2 Glob SerPl Elph-Mcnc     0.4 - 1.0 g/dL 0.6  B-Globulin SerPl Elph-Mcnc     0.7 - 1.3 g/dL 1.0  Gamma Glob SerPl Elph-Mcnc     0.4 - 1.8 g/dL 0.7  M Protein SerPl Elph-Mcnc     Not Observed g/dL Not Observed  Globulin, Total     2.2 - 3.9 g/dL 2.5  Albumin/Glob SerPl     0.7 - 1.7 1.7  IFE 1      Comment  Please Note (HCV):      Comment  LDH     125 - 245 U/L 255 (H)  Hep C Virus Ab     0.0 - 0.9 s/co ratio <0.1  HIV     Non Reactive Non Reactive  Hep B Core Ab,  Tot     Negative Negative  Hepatitis B Surface Ag     Negative Negative  Smear Result      Smear Available   . Lab Results  Component Value Date   LDH 264 (H) 11/20/2016     RADIOGRAPHIC STUDIES: I have personally reviewed the radiological images as listed and agreed with the findings in the report. No results found.  ASSESSMENT & PLAN:   66 year old female was traveling through [place] for the last 4 yrs now with   1) Moderate  splenomegaly at 16.6 cm based on ultrasound on 09/10/2015 in [place]. She had a malaria smear done which was negative . Currently has no fevers no chills no night sweats no weight loss no palpable lymphadenopathy . Reports no malaria or other known infections that she had while she was an [place] . Notes some arthritis in her hands but this has not been worked up to rule out inflammatory arthritis.  Peripheral blood smear shows normocytic red cells, no increased schistocytes. No abnormal WBC forms or blasts .  HIV, hepatitis C and hepatitis B serologies unrevealing   SPEP shows no M spike and negative IFE.  Patient did have an elevated LDH which on repeat labs today is still elevated at 264 . Unclear etiology for LDH elevation but might be concerning for possible splenic lymphoma .  2) Thrombocytopenia - platelet counts last year were 123 k now down to 110 k. Likely due to splenomegaly with hypersplenism .  Plan -Patient was recommended  again  CT abdomen pelvis to evaluate splenomegaly and evaluated for focal splenic lesions, abdominal adenopathy or other abdominal pathology and to evaluate for liver disease  -this is more important since the patient still has clinical evidence of splenomegaly with some left upper quadrant abdominal discomfort and elevated LDH levels - she is tolerating agreeable for the CT abdomen and pelvis and this was reordered and she'll be scheduled as soon as possible .  2) left ovarian cyst - 4.2 cm . 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 CA-125 levels were 28.6 on the normal range of 0-35 . Alpha-fetoprotein was within normal limits and 1.8 . CEA level was 1.3 . -will f/u on CT of abdomen and pelvis   3) family history of breast cancer . Patient notes that her sister had breast cancer in her 20s . Has not had genetic testing . No other family history of breast cancer ovarian cancer pancreatic cancer or melanoma . She has had recent left breast cysts that were aspirated in [place] and per her report were noted to be benign . Plan - continue yearly mammograms with PCP  4) history of depression was previously on Lexapro currently not on any medications . Lost her husband 2009 throat cancer .  Labs today Ct abd/Pelvis in 1 week RTC with Dr Irene Limbo based on results   All of the patients questions were answered with apparent satisfaction. The patient knows to call the clinic with any problems, questions or concerns.  I spent 20  minutes counseling the patient face to face. The total time spent in the appointment was 20 minutes and more than 50% was on counseling and direct patient cares.    Sullivan Lone MD Cash AAHIVMS [facility] Hematology/Oncology Physician [facility]  (Office):       [phone] (Work cell):  [phone] (Fax):           [phone]

## 2016-11-23 NOTE — Telephone Encounter (Signed)
Pt called for her CT appt. Prior auth not done yet. inbasket sent to [facility] with cc pod 4.

## 2016-11-26 LAB — FLOW CYTOMETRY

## 2016-11-30 ENCOUNTER — Ambulatory Visit (HOSPITAL_COMMUNITY)
Admission: RE | Admit: 2016-11-30 | Discharge: 2016-11-30 | Disposition: A | Discharge status: Home / Self Care | Payer: Medicare HMO | Source: Ambulatory Visit | Attending: Hematology | Admitting: Hematology

## 2016-11-30 DIAGNOSIS — R140 Abdominal distension (gaseous): Secondary | ICD-10-CM | POA: Diagnosis not present

## 2016-11-30 DIAGNOSIS — R1012 Left upper quadrant pain: Secondary | ICD-10-CM | POA: Diagnosis not present

## 2016-11-30 DIAGNOSIS — K668 Other specified disorders of peritoneum: Secondary | ICD-10-CM | POA: Diagnosis not present

## 2016-11-30 DIAGNOSIS — R161 Splenomegaly, not elsewhere classified: Secondary | ICD-10-CM | POA: Insufficient documentation

## 2016-11-30 MED ORDER — IOPAMIDOL (ISOVUE-300) INJECTION 61%
100.0000 mL | Freq: Once | INTRAVENOUS | Status: AC | PRN
  Administered 2016-11-30: 100 mL via INTRAVENOUS

## 2016-11-30 MED ORDER — IOPAMIDOL (ISOVUE-300) INJECTION 61%
INTRAVENOUS | Status: AC
  Filled 2016-11-30: qty 100

## 2016-12-01 DIAGNOSIS — M79642 Pain in left hand: Secondary | ICD-10-CM | POA: Diagnosis not present

## 2016-12-05 ENCOUNTER — Telehealth: Payer: Self-pay | Admitting: Hematology

## 2016-12-05 ENCOUNTER — Ambulatory Visit (HOSPITAL_BASED_OUTPATIENT_CLINIC_OR_DEPARTMENT_OTHER): Payer: Medicare HMO | Admitting: Hematology

## 2016-12-05 VITALS — BP 121/68 | HR 68 | Temp 98.5°F | Resp 18 | Ht 62.0 in | Wt 124.0 lb

## 2016-12-05 DIAGNOSIS — C884 Extranodal marginal zone B-cell lymphoma of mucosa-associated lymphoid tissue [MALT-lymphoma]: Secondary | ICD-10-CM | POA: Diagnosis not present

## 2016-12-05 DIAGNOSIS — N83202 Unspecified ovarian cyst, left side: Secondary | ICD-10-CM | POA: Diagnosis not present

## 2016-12-05 DIAGNOSIS — Z803 Family history of malignant neoplasm of breast: Secondary | ICD-10-CM | POA: Diagnosis not present

## 2016-12-05 DIAGNOSIS — D696 Thrombocytopenia, unspecified: Secondary | ICD-10-CM | POA: Diagnosis not present

## 2016-12-05 DIAGNOSIS — C8307 Small cell B-cell lymphoma, spleen: Secondary | ICD-10-CM

## 2016-12-05 NOTE — Telephone Encounter (Signed)
Scheduled appt pe r8/22 los - Gave patient AVS and calender per los.  

## 2016-12-06 DIAGNOSIS — M79642 Pain in left hand: Secondary | ICD-10-CM | POA: Diagnosis not present

## 2016-12-11 NOTE — Progress Notes (Signed)
[name]    HEMATOLOGY/ONCOLOGY CLINIC NOTE  Date of Service: 12/05/2016  Patient Care Team: London Pepper, MD as PCP - General (Family Medicine)  CHIEF COMPLAINTS/PURPOSE OF CONSULTATION:  Splenomegaly f/u to discuss results and further evaluation.  HISTORY OF PRESENTING ILLNESS:   Joanne Miller is a wonderful 66 y.o. female who has been self referred to [place] for evaluation and management of newly noted splenomegaly.  Patient does not have many chronic medical comorbidities. 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 She also visited [place], [place], [place] and a few other places last 4 years.  A couple months ago while still in [place] she noticed some fullness in the upper and left upper abdomen and had an ultrasound which showed splenomegaly with the spleen length of 16.6 cm with no overt focal splenic lesions. No overt abdominal adenopathy noted. No free fluid. Liver, gallbladder, pancreas, both kidneys loss of bowel were noted to be within normal limits. There was incidental finding of a 4.2 cm thin-walled unilocular left ovarian cyst with no solid components. Normal uterus and right ovary was seen.  Her blood counts done on 09/02/2015 showed normal hemoglobin of 14 with an MCV of 91, WBC count of 5.9k with normal differential. Platelet clumping was noted with reported count of 103k. Malaria parasite smear was done and no other parasites were noted.  INTERVAL HISTORY  Ms Honeyman is here for follow-up of her additional workup for splenomegaly. She had a CT scan shows persistent splenomegaly. Peripheral blood flow cytometry concerning for splenic marginal zone lymphoma versus splenic pulp lymphoma. She notes no abdominal pain. No fevers no chills no night sweats no unexpected weight loss area notes such she overall is feeling quite well. 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  MEDICAL HISTORY:   #1 dyslipidemia - not on any medications #2 GERD #3 hot flashes menopausal status was previously on [place] patch #4 history of sebaceous cysts. #5 history of recent left breast cysts - aspirated by ultrasound guidance and noted to be negative for cancer. #6 family history of breast cancer in her sister when she was in her 45s. No genetic testing done at that time.  SURGICAL HISTORY: #1 breast cysts 2 aspiration on the left side . #2 root canal treatments #3 sebaceous cyst excision   L HISTORY: Social History   Social History  . Marital status: Married    Spouse name: N/A  . Number of children: N/A  . Years of education: N/A   Occupational History  . Not on file.   Social History Main Topics  . Smoking status: Never Smoker  . Smokeless tobacco: Never Used  . Alcohol use No  . Drug use: No  . Sexual activity: Not on file   Other Topics Concern  . Not on file   Social History Narrative  . No narrative on file    FAMILY HISTORY: No family history on file.  ALLERGIES:  is allergic to codeine.  MEDICATIONS:  Current Outpatient Prescriptions  Medication Sig Dispense Refill  . aspirin 81 MG EC tablet Take 81 mg by mouth daily. Swallow whole.    . Cyanocobalamin (VITAMIN B12 PO) Take by mouth.    . Misc Natural Products (TART CHERRY ADVANCED PO) Take by mouth.    [name]  Omega-3 Fatty Acids (FISH OIL) 1200 MG CAPS Take by mouth.    [name] VITAMIN E PO Take by mouth.     No current facility-administered medications for this visit.     REVIEW OF SYSTEMS:    10 Point review of Systems was done is negative except as noted above.  PHYSICAL EXAMINATION: ECOG PERFORMANCE STATUS: 0 - Asymptomatic  . Vitals:   12/05/16 1139  BP: 121/68  Pulse: 68  Resp: 18  Temp: 98.5 F (36.9 C)  SpO2: 98%   Filed Weights   12/05/16 1139  Weight: 124  lb (56.2 kg)   .Body mass index is 22.68 kg/m.  GENERAL:alert, in no acute distress and comfortable SKIN: skin color, texture, turgor are normal, no rashes or significant lesions EYES: normal, conjunctiva are pink and non-injected, sclera clear OROPHARYNX:no exudate, no erythema and lips, buccal mucosa, and tongue normal  NECK: supple, no JVD, thyroid normal size, non-tender, without nodularity LYMPH:  no palpable lymphadenopathy in the cervical, axillary or inguinal LUNGS: clear to auscultation with normal respiratory effort HEART: regular rate & rhythm,  no murmurs and no lower extremity edema ABDOMEN: abdomen soft, non-tender, normoactive bowel sounds , No palpable hepatomegaly . Spleen palpable about 2 cm under the left costal margin in the midclavicular line . Musculoskeletal: no cyanosis of digits and no clubbing  PSYCH: alert & oriented x 3 with fluent speech NEURO: no focal motor/sensory deficits  LABORATORY DATA:  I have reviewed the data as listed  . CBC Latest Ref Rng & Units 11/20/2016 11/07/2015  WBC 3.9 - 10.3 10e3/uL 6.2 5.3  Hemoglobin 11.6 - 15.9 g/dL 14.5 14.4  Hematocrit 34.8 - 46.6 % 43.7 43.7  Platelets 145 - 400 10e3/uL 110(L) 123(L)   . CBC    Component Value Date/Time   WBC 6.2 11/20/2016 1445   RBC 4.64 11/20/2016 1445   HGB 14.5 11/20/2016 1445   HCT 43.7 11/20/2016 1445   PLT 110 (L) 11/20/2016 1445   MCV 94.2 11/20/2016 1445   MCH 31.3 11/20/2016 1445   MCHC 33.2 11/20/2016 1445   RDW 13.5 11/20/2016 1445   LYMPHSABS 2.5 11/20/2016 1445   MONOABS 0.3 11/20/2016 1445   EOSABS 0.1 11/20/2016 1445   BASOSABS 0.0 11/20/2016 1445    . CMP Latest Ref Rng & Units 11/20/2016 11/07/2015 11/07/2015  Glucose 70 - 140 mg/dl 88 89 -  BUN 7.0 - 26.0 mg/dL 17.7 13.9 -  Creatinine 0.6 - 1.1 mg/dL 0.8 0.9 -  Sodium 136 - 145 mEq/L 140 141 -  Potassium 3.5 - 5.1 mEq/L 3.9 4.0 -  CO2 22 - 29 mEq/L 25 25 -  Calcium 8.4 - 10.4 mg/dL 10.1 9.7 -  Total Protein  6.4 - 8.3 g/dL 7.1 7.4 6.5  Total Bilirubin 0.20 - 1.20 mg/dL 0.87 0.70 -  Alkaline Phos 40 - 150 U/L 78 90 -  AST 5 - 34 U/L 19 23 -  ALT 0 - 55 U/L 12 17 -     . Lab Results  Component Value Date   LDH 264 (H) 11/20/2016     RADIOGRAPHIC STUDIES: I have personally reviewed the radiological images as listed and agreed with the findings in the report. Ct Abdomen Pelvis W Contrast  Result Date: 12/01/2016 CLINICAL DATA:  Abdominal distension, left upper quadrant pain. EXAM: CT ABDOMEN AND PELVIS WITH CONTRAST TECHNIQUE: Multidetector CT imaging of the abdomen and pelvis was performed using the standard protocol following bolus administration of intravenous contrast. CONTRAST:  155m ISOVUE-300 IOPAMIDOL (ISOVUE-300) INJECTION 61% COMPARISON:  None. FINDINGS: Lower chest: No acute findings. Hepatobiliary: Low-attenuation lesions measure up to 1 cm in the posterior right hepatic lobe, too small to characterize. Liver and gallbladder are otherwise unremarkable. Pancreas: Negative. Spleen: Measures 7.5 x 14.0 x 12.2 cm (volume equals 641 cc). Adrenals/Urinary Tract: Adrenal glands and kidneys are unremarkable. Ureters are decompressed. Bladder is grossly unremarkable. Stomach/Bowel: Stomach, small bowel, appendix and colon are unremarkable. Vascular/Lymphatic: Vascular structures are unremarkable. No pathologically enlarged lymph nodes. Reproductive: Uterus is visualized. 5.3 cm low-attenuation left adnexal mass. Other: No free fluid.  Mesenteries and peritoneum are unremarkable. Musculoskeletal: No worrisome lytic or sclerotic lesions. Degenerative disc disease at L2-3. IMPRESSION: 1. Splenomegaly. No acute findings to explain the patient's symptoms. 2. Cystic appearing left adnexal mass, likely arising from the left ovary. Given patient's age, a baseline pelvic ultrasound is recommended. This recommendation follows ACR consensus guidelines: White Paper of the ACR Incidental Findings Committee II on  Adnexal Findings. J Am Coll Radiol [phone] Electronically Signed   By: MLorin PicketM.D.   On: 12/01/2016 07:44    ASSESSMENT & PLAN:   66[place] now with   1) Moderate  splenomegaly at 16.6 cm based [place] shows improvement in splenomegaly to 14 cm.  2) Newly diagnosed splenic marginal zone lymphoma. Based on peripheral blood flow cytometry. No constitutional symptoms. No anemia. And mild thrombocytopenia with platelets of 110k HIV, hepatitis C and hepatitis B serologies unrevealing  SPEP shows no M spike and negative IFE.  3) Thrombocytopenia - platelet counts last year were 123 k now down to 110 k. Likely due to splenomegaly with hypersplenism   Plan -results of the lab workup and CT abdomen pelvis were discussed in detail with the patient. -We discuss her new diagnosis of splenic marginal zone lymphoma, natural history, prognosis and indications for treatment and  Type of treatment if indicated. -At this time the patient does not meet any criteria to need treatment for her splenic marginal zone lymphoma. -No strong indication for bone marrow biopsy at this time but might need one in the future for additional disease assessment if there are any signs of progression or worsening cytopenias. -Patient is hepatitis C negative as per prior testing. -Returned to care with Dr. KIrene Limboin 4 months with repeat labs. Earlier if any new symptoms or concerns arise.  2) left ovarian cyst - 4.2 cm . 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 CA-125 levels were 28.6 on the normal range of 0-35 . Alpha-fetoprotein was within normal limits and 1.8 . CEA level was 1.3 . -This was noted again on CT of abdomen and pelvis  -would recommend GYN evaluation through her primary care physician. Patient notes that she already has one.  3) family history of breast cancer . Patient notes that her  sister had breast cancer in her 35s. Has not had genetic testing . No other family history of breast cancer ovarian cancer pancreatic cancer or melanoma . She has had recent left breast [place] to be [place] . Plan - continue yearly mammograms with PCP  4) history of depression was previously on Lexapro currently not on any medications . Lost her husband 2009 throat cancer .  RTC in 4 months with labs with Dr KIrene Limbo All of the patients questions were  answered with apparent satisfaction. The patient knows to call the clinic with any problems, questions or concerns.  I spent 20  minutes counseling the patient face to face. The total time spent in the appointment was 20 minutes and more than 50% was on counseling and direct patient cares.    Sullivan Lone MD [place] AAHIVMS [facility] Hematology/Oncology Physician [facility]  (Office):       [phone] (Work cell):  [phone] (Fax):           [phone]

## 2017-01-03 DIAGNOSIS — R161 Splenomegaly, not elsewhere classified: Secondary | ICD-10-CM | POA: Diagnosis not present

## 2017-01-08 DIAGNOSIS — N83202 Unspecified ovarian cyst, left side: Secondary | ICD-10-CM | POA: Diagnosis not present

## 2017-01-10 DIAGNOSIS — Z23 Encounter for immunization: Secondary | ICD-10-CM | POA: Diagnosis not present

## 2017-01-10 DIAGNOSIS — Z801 Family history of malignant neoplasm of trachea, bronchus and lung: Secondary | ICD-10-CM | POA: Diagnosis not present

## 2017-01-10 DIAGNOSIS — N83202 Unspecified ovarian cyst, left side: Secondary | ICD-10-CM | POA: Diagnosis not present

## 2017-01-10 DIAGNOSIS — Z803 Family history of malignant neoplasm of breast: Secondary | ICD-10-CM | POA: Diagnosis not present

## 2017-01-10 DIAGNOSIS — Z885 Allergy status to narcotic agent status: Secondary | ICD-10-CM | POA: Diagnosis not present

## 2017-01-10 DIAGNOSIS — C8307 Small cell B-cell lymphoma, spleen: Secondary | ICD-10-CM | POA: Diagnosis not present

## 2017-01-10 DIAGNOSIS — Z7982 Long term (current) use of aspirin: Secondary | ICD-10-CM | POA: Diagnosis not present

## 2017-01-10 DIAGNOSIS — F329 Major depressive disorder, single episode, unspecified: Secondary | ICD-10-CM | POA: Diagnosis not present

## 2017-01-10 DIAGNOSIS — Z79899 Other long term (current) drug therapy: Secondary | ICD-10-CM | POA: Diagnosis not present

## 2017-01-11 DIAGNOSIS — Z0000 Encounter for general adult medical examination without abnormal findings: Secondary | ICD-10-CM | POA: Diagnosis not present

## 2017-01-11 DIAGNOSIS — C8580 Other specified types of non-Hodgkin lymphoma, unspecified site: Secondary | ICD-10-CM | POA: Diagnosis not present

## 2017-01-11 DIAGNOSIS — M6530 Trigger finger, unspecified finger: Secondary | ICD-10-CM | POA: Diagnosis not present

## 2017-01-18 ENCOUNTER — Encounter: Payer: Self-pay | Admitting: Hematology

## 2017-01-24 DIAGNOSIS — N83202 Unspecified ovarian cyst, left side: Secondary | ICD-10-CM | POA: Diagnosis not present

## 2017-01-25 DIAGNOSIS — C8510 Unspecified B-cell lymphoma, unspecified site: Secondary | ICD-10-CM | POA: Diagnosis not present

## 2017-01-25 DIAGNOSIS — R161 Splenomegaly, not elsewhere classified: Secondary | ICD-10-CM | POA: Diagnosis not present

## 2017-01-25 DIAGNOSIS — C8307 Small cell B-cell lymphoma, spleen: Secondary | ICD-10-CM | POA: Diagnosis not present

## 2017-01-25 DIAGNOSIS — C8580 Other specified types of non-Hodgkin lymphoma, unspecified site: Secondary | ICD-10-CM | POA: Diagnosis not present

## 2017-02-01 DIAGNOSIS — R234 Changes in skin texture: Secondary | ICD-10-CM | POA: Diagnosis not present

## 2017-02-01 DIAGNOSIS — Z801 Family history of malignant neoplasm of trachea, bronchus and lung: Secondary | ICD-10-CM | POA: Diagnosis not present

## 2017-02-01 DIAGNOSIS — D696 Thrombocytopenia, unspecified: Secondary | ICD-10-CM | POA: Diagnosis not present

## 2017-02-01 DIAGNOSIS — Z885 Allergy status to narcotic agent status: Secondary | ICD-10-CM | POA: Diagnosis not present

## 2017-02-01 DIAGNOSIS — F329 Major depressive disorder, single episode, unspecified: Secondary | ICD-10-CM | POA: Diagnosis not present

## 2017-02-01 DIAGNOSIS — R161 Splenomegaly, not elsewhere classified: Secondary | ICD-10-CM | POA: Diagnosis not present

## 2017-02-01 DIAGNOSIS — C8519 Unspecified B-cell lymphoma, extranodal and solid organ sites: Secondary | ICD-10-CM | POA: Diagnosis not present

## 2017-02-01 DIAGNOSIS — C8580 Other specified types of non-Hodgkin lymphoma, unspecified site: Secondary | ICD-10-CM | POA: Diagnosis not present

## 2017-02-01 DIAGNOSIS — R5383 Other fatigue: Secondary | ICD-10-CM | POA: Diagnosis not present

## 2017-02-01 DIAGNOSIS — Z7982 Long term (current) use of aspirin: Secondary | ICD-10-CM | POA: Diagnosis not present

## 2017-02-01 DIAGNOSIS — N83202 Unspecified ovarian cyst, left side: Secondary | ICD-10-CM | POA: Diagnosis not present

## 2017-02-04 ENCOUNTER — Telehealth: Payer: Self-pay

## 2017-02-04 NOTE — Telephone Encounter (Signed)
Pt called to schedule infusion appt for rituxan. Scheduler passed on the message to desk RN. Communicated with pt, who said that Dr. Irene Limbo had approved Rituxan infusions at [facility] despite seeing Dr. Justice Britain at [facility]. Dr. Irene Limbo currently unaware of any email conversation with this pt's physician as described by the pt. At this time, we are willing to f/u with the pt to re-evaluate status and necessity for treatment. At the time of last pt appt, pt did not qualify for treatment with rituxan. If pt is going to see physician in [facility], she will need to receive treatment there as well. Spoke with pt, who verbalized confusion, as it seems Dr. Justice Britain and Dr. Irene Limbo are not on the same page, but pt verbalized understanding of treatment location at this time. Pt does intend to f/u with Dr. Irene Limbo for her December appt.

## 2017-02-06 ENCOUNTER — Telehealth: Payer: Self-pay

## 2017-02-06 ENCOUNTER — Telehealth: Payer: Self-pay | Admitting: Hematology

## 2017-02-06 NOTE — Telephone Encounter (Signed)
Called patient regarding upcoming appointment °

## 2017-02-06 NOTE — Telephone Encounter (Signed)
Spoke with Network engineer at [facility] [phone]. Looking into BMX results that were not found in [place]. Shared faxed number and should expect them today [phone]. Scheduling message sent for patient to be seen by Dr. Irene Limbo with lab work this week or next per conversation between Dr. Reita Chard and Dr. Irene Limbo.

## 2017-02-08 DIAGNOSIS — C8580 Other specified types of non-Hodgkin lymphoma, unspecified site: Secondary | ICD-10-CM | POA: Diagnosis not present

## 2017-02-08 DIAGNOSIS — Z5112 Encounter for antineoplastic immunotherapy: Secondary | ICD-10-CM | POA: Diagnosis not present

## 2017-02-08 DIAGNOSIS — Z79899 Other long term (current) drug therapy: Secondary | ICD-10-CM | POA: Diagnosis not present

## 2017-02-11 ENCOUNTER — Encounter: Payer: Self-pay | Admitting: Hematology

## 2017-02-11 ENCOUNTER — Telehealth: Payer: Self-pay | Admitting: Hematology

## 2017-02-11 ENCOUNTER — Other Ambulatory Visit (HOSPITAL_BASED_OUTPATIENT_CLINIC_OR_DEPARTMENT_OTHER): Payer: Medicare HMO | Admitting: *Deleted

## 2017-02-11 ENCOUNTER — Ambulatory Visit (HOSPITAL_BASED_OUTPATIENT_CLINIC_OR_DEPARTMENT_OTHER): Payer: Medicare HMO | Admitting: Hematology

## 2017-02-11 ENCOUNTER — Other Ambulatory Visit: Payer: Self-pay | Admitting: *Deleted

## 2017-02-11 ENCOUNTER — Other Ambulatory Visit (HOSPITAL_BASED_OUTPATIENT_CLINIC_OR_DEPARTMENT_OTHER): Payer: Medicare HMO

## 2017-02-11 VITALS — BP 115/60 | HR 68 | Temp 98.9°F | Resp 20 | Ht 62.0 in | Wt 122.1 lb

## 2017-02-11 DIAGNOSIS — R161 Splenomegaly, not elsewhere classified: Secondary | ICD-10-CM

## 2017-02-11 DIAGNOSIS — Z803 Family history of malignant neoplasm of breast: Secondary | ICD-10-CM | POA: Diagnosis not present

## 2017-02-11 DIAGNOSIS — C8587 Other specified types of non-Hodgkin lymphoma, spleen: Secondary | ICD-10-CM

## 2017-02-11 DIAGNOSIS — C8307 Small cell B-cell lymphoma, spleen: Secondary | ICD-10-CM | POA: Insufficient documentation

## 2017-02-11 DIAGNOSIS — C8519 Unspecified B-cell lymphoma, extranodal and solid organ sites: Secondary | ICD-10-CM

## 2017-02-11 DIAGNOSIS — R5383 Other fatigue: Secondary | ICD-10-CM

## 2017-02-11 DIAGNOSIS — D696 Thrombocytopenia, unspecified: Secondary | ICD-10-CM

## 2017-02-11 DIAGNOSIS — L271 Localized skin eruption due to drugs and medicaments taken internally: Secondary | ICD-10-CM

## 2017-02-11 DIAGNOSIS — R1012 Left upper quadrant pain: Secondary | ICD-10-CM

## 2017-02-11 DIAGNOSIS — R61 Generalized hyperhidrosis: Secondary | ICD-10-CM

## 2017-02-11 LAB — CBC WITH DIFFERENTIAL/PLATELET
BASO%: 0.4 % (ref 0.0–2.0)
BASOS ABS: 0 10*3/uL (ref 0.0–0.1)
EOS ABS: 0.1 10*3/uL (ref 0.0–0.5)
EOS%: 1.7 % (ref 0.0–7.0)
HCT: 43.4 % (ref 34.8–46.6)
HEMOGLOBIN: 14.3 g/dL (ref 11.6–15.9)
LYMPH%: 27.3 % (ref 14.0–49.7)
MCH: 30.5 pg (ref 25.1–34.0)
MCHC: 32.9 g/dL (ref 31.5–36.0)
MCV: 92.5 fL (ref 79.5–101.0)
MONO#: 0.3 10*3/uL (ref 0.1–0.9)
MONO%: 5.6 % (ref 0.0–14.0)
NEUT#: 3.5 10*3/uL (ref 1.5–6.5)
NEUT%: 65 % (ref 38.4–76.8)
PLATELETS: 102 10*3/uL — AB (ref 145–400)
RBC: 4.69 10*6/uL (ref 3.70–5.45)
RDW: 13.5 % (ref 11.2–14.5)
WBC: 5.3 10*3/uL (ref 3.9–10.3)
lymph#: 1.5 10*3/uL (ref 0.9–3.3)

## 2017-02-11 LAB — COMPREHENSIVE METABOLIC PANEL
ALBUMIN: 4.2 g/dL (ref 3.5–5.0)
ALK PHOS: 71 U/L (ref 40–150)
ALT: 11 U/L (ref 0–55)
ANION GAP: 10 meq/L (ref 3–11)
AST: 17 U/L (ref 5–34)
BILIRUBIN TOTAL: 0.52 mg/dL (ref 0.20–1.20)
BUN: 20.4 mg/dL (ref 7.0–26.0)
CO2: 24 mEq/L (ref 22–29)
Calcium: 9.9 mg/dL (ref 8.4–10.4)
Chloride: 106 mEq/L (ref 98–109)
Creatinine: 0.8 mg/dL (ref 0.6–1.1)
GLUCOSE: 84 mg/dL (ref 70–140)
Potassium: 4.2 mEq/L (ref 3.5–5.1)
SODIUM: 140 meq/L (ref 136–145)
TOTAL PROTEIN: 6.9 g/dL (ref 6.4–8.3)

## 2017-02-11 LAB — LACTATE DEHYDROGENASE: LDH: 233 U/L (ref 125–245)

## 2017-02-11 LAB — TSH: TSH: 2.186 m[IU]/L (ref 0.308–3.960)

## 2017-02-11 NOTE — Patient Instructions (Signed)
Thank you for choosing [facility] to provide your oncology and hematology care.  To afford each patient quality time with our providers, please arrive 30 minutes before your scheduled appointment time.  If you arrive late for your appointment, you may be asked to reschedule.  We strive to give you quality time with our providers, and arriving late affects you and other patients whose appointments are after yours.   If you are a no show for multiple scheduled visits, you may be dismissed from the clinic at the providers discretion.    Again, thank you for choosing [facility], our hope is that these requests will decrease the amount of time that you wait before being seen by our physicians.  ______________________________________________________________________  Should you have questions after your visit to the [facility], please contact our office at [phone] between the hours of 8:30 and 4:30 p.m.    Voicemails left after 4:30p.m will not be returned until the following business day.    For prescription refill requests, please have your pharmacy contact us directly.  Please also try to allow 48 hours for prescription requests.    Please contact the scheduling department for questions regarding scheduling.  For scheduling of procedures such as PET scans, CT scans, MRI, Ultrasound, etc please contact central scheduling at [phone].    Resources For Cancer Patients and Caregivers:   Oncolink.org:  A wonderful resource for patients and healthcare providers for information regarding your disease, ways to tract your treatment, what to expect, etc.     American Cancer Society:  800-227-2345  Can help patients locate various types of support and financial assistance  Cancer Care: 1-800-813-HOPE (4673) Provides financial assistance, online support groups, medication/co-pay assistance.    [place] DSS:  336-641-3447 Where to apply for food  stamps, Medicaid, and utility assistance  Medicare Rights Center: 800-333-4114 Helps people with Medicare understand their rights and benefits, navigate the Medicare system, and secure the quality healthcare they deserve  SCAT: 336-333-6589 [place] [employer]'s shared-ride transportation service for eligible riders who have a disability that prevents them from riding the fixed route bus.    For additional information on assistance programs please contact our social worker:   Grier Hock/Abigail Elmore:  [phone]            

## 2017-02-11 NOTE — Progress Notes (Signed)
Patient on plan of care prior to pathways. 

## 2017-02-11 NOTE — Telephone Encounter (Signed)
Scheduled appt per 10/29 los - Gave patient AVS and calender per los.  

## 2017-02-11 NOTE — Progress Notes (Signed)
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 female who has been self referred to [place] for evaluation and management of newly noted splenomegaly.  Patient does not have many chronic medical comorbidities. 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 She also visited [place], [place], [place] and a few other places last 4 years.  A couple months ago while still in [place] she noticed some fullness in the upper and left upper abdomen and had an ultrasound which showed splenomegaly with the spleen length of 16.6 cm with no overt focal splenic lesions. No overt abdominal adenopathy noted. No free fluid. Liver, gallbladder, pancreas, both kidneys loss of bowel were noted to be within normal limits. There was incidental finding of a 4.2 cm thin-walled unilocular left ovarian cyst with no solid components. Normal uterus and right ovary was seen.  Her blood counts done on 09/02/2015 showed normal hemoglobin of 14 with an MCV of 91, WBC count of 5.9k with normal differential. Platelet clumping was noted with reported count of 103k. Malaria parasite smear was done and no other parasites were noted.  INTERVAL HISTORY  Joanne Miller is here for follow-up of her Splenic marginal zone lymphoma.   She notes that since her last visit with [place] she has developed fatigue , night sweats and some chills. Not sure about fevers since she did not check this. Was seen at [facility] and had a BM Bx and other evaluation which was consistent with the recently made diagnosis of splenic Marginal Zone lymphoma. Has been started on Rituxan and received the 1st dose at [facility]  on 02/08/2017. Notes increased fatigue after Rituxan and developed a delayed rash under both her breasts and center of chest which lasted about 2 days and resolved spontaneously. No CP/SOB/pruiritus. She was recommended to also report this to [facility] oncologist which she hadnt done.  .She states her daughter has hashimoto's and she herself recently had her thyroid checked which showed borderline elevated TSH results. Rpt TSH today is within normal limits.  On review of systems,pt reports decreased taste, fatigue, night sweats, aches to her legs, weight loss, chills,mild abdominal pain, and denies fever, abdominal pain and any other accompanying symptoms.   MEDICAL HISTORY:   #1 dyslipidemia - not on any medications #2 GERD #3 hot flashes menopausal status was previously on [place] patch #4 history of sebaceous cysts. #5 history of recent left breast cysts - aspirated by ultrasound guidance and noted to be negative for cancer. #6 family history of breast cancer in her sister when she was in her 70s. No genetic testing done at that time.  SURGICAL HISTORY: #1 breast cysts 2 aspiration on the left side . #2 root canal treatments #3 sebaceous cyst excision   L HISTORY: Social History   Social History  . Marital status: Married    Spouse name: N/A  . Number of children: N/A  . Years of education: N/A   Occupational History  . Not on file.   Social History Main Topics  . Smoking status: Never Smoker  . Smokeless tobacco: Never Used  . Alcohol use No  .  Drug use: No  . Sexual activity: Not on file   Other Topics Concern  . Not on file   Social History Narrative  . No narrative on file    FAMILY HISTORY: History reviewed. No pertinent family history.  ALLERGIES:  is allergic to codeine.  MEDICATIONS:  Current Outpatient Prescriptions  Medication Sig Dispense Refill  . aspirin 81 MG EC tablet Take 81 mg by mouth daily. Swallow whole.    . Misc Natural Products  (TART CHERRY ADVANCED PO) Take by mouth.    . Omega-3 Fatty Acids (FISH OIL) 1200 MG CAPS Take by mouth.     No current facility-administered medications for this visit.     REVIEW OF SYSTEMS:    10 Point review of Systems was done is negative except as noted above.  PHYSICAL EXAMINATION: ECOG PERFORMANCE STATUS: 0 - Asymptomatic  . Vitals:   02/11/17 1140  BP: 115/60  Pulse: 68  Resp: 20  Temp: 98.9 F (37.2 C)  SpO2: 100%   Filed Weights   02/11/17 1140  Weight: 122 lb 1.6 oz (55.4 kg)   .Body mass index is 22.33 kg/m.  GENERAL:alert, in no acute distress and comfortable SKIN: skin color, texture, turgor are normal, no rashes or significant lesions EYES: normal, conjunctiva are pink and non-injected, sclera clear OROPHARYNX:no exudate, no erythema and lips, buccal mucosa, and tongue normal  NECK: supple, no JVD, thyroid normal size, non-tender, without nodularity LYMPH:  no palpable lymphadenopathy in the cervical, axillary or inguinal LUNGS: clear to auscultation with normal respiratory effort HEART: regular rate & rhythm,  no murmurs and no lower extremity edema ABDOMEN: abdomen soft, non-tender, normoactive bowel sounds , No palpable hepatomegaly . Spleen palpable about 2 cm under the left costal margin in the midclavicular line . Musculoskeletal: no cyanosis of digits and no clubbing  PSYCH: alert & oriented x 3 with fluent speech NEURO: no focal motor/sensory deficits  LABORATORY DATA:  I have reviewed the data as listed  . CBC Latest Ref Rng & Units 02/11/2017 11/20/2016 11/07/2015  WBC 3.9 - 10.3 10e3/uL 5.3 6.2 5.3  Hemoglobin 11.6 - 15.9 g/dL 14.3 14.5 14.4  Hematocrit 34.8 - 46.6 % 43.4 43.7 43.7  Platelets 145 - 400 10e3/uL 102(L) 110(L) 123(L)   . CBC    Component Value Date/Time   WBC 5.3 02/11/2017 1112   RBC 4.69 02/11/2017 1112   HGB 14.3 02/11/2017 1112   HCT 43.4 02/11/2017 1112   PLT 102 (L) 02/11/2017 1112   MCV 92.5 02/11/2017 1112    MCH 30.5 02/11/2017 1112   MCHC 32.9 02/11/2017 1112   RDW 13.5 02/11/2017 1112   LYMPHSABS 1.5 02/11/2017 1112   MONOABS 0.3 02/11/2017 1112   EOSABS 0.1 02/11/2017 1112   BASOSABS 0.0 02/11/2017 1112    . CMP Latest Ref Rng & Units 02/11/2017 11/20/2016 11/07/2015  Glucose 70 - 140 mg/dl 84 88 89  BUN 7.0 - 26.0 mg/dL 20.4 17.7 13.9  Creatinine 0.6 - 1.1 mg/dL 0.8 0.8 0.9  Sodium 136 - 145 mEq/L 140 140 141  Potassium 3.5 - 5.1 mEq/L 4.2 3.9 4.0  CO2 22 - 29 mEq/L '24 25 25  ' Calcium 8.4 - 10.4 mg/dL 9.9 10.1 9.7  Total Protein 6.4 - 8.3 g/dL 6.9 7.1 7.4  Total Bilirubin 0.20 - 1.20 mg/dL 0.52 0.87 0.70  Alkaline Phos 40 - 150 U/L 71 78 90  AST 5 - 34 U/L '17 19 23  ' ALT 0 - 55 U/L 11  12 17   Component     Latest Ref Rng & Units 02/11/2017  LDH     125 - 245 U/L 233  TSH     0.308 - 3.960 m(IU)/L 2.186         Hematopathology Order10/03/2017 [place]  BONE MARROW BIOPSY 01/25/2017: Interpretation Fourteen of the twenty metaphase cells analyzed are normal (46,XX). Six cells are abnormal and contain a deletion within the long arm of chromosome 7 and additional material of unknown origin attached to the long arm of chromosome 14 at or near the [facility] locus. Deletion of 7q is seen in mature B-cell neoplasms including marginal zone lymphoma. Ten cells were analyzed from the 24 hour unstimulated culture and ten cells were analyzed from the 72 hour culture stimulated with pokeweed mitogen, phorbol 12-myristate 13-acetate (PMA), and a CpG-oligodeoxynucleotide (ODN). The abnormal clone was only seen in the 72 hour stimulated culture.   Component Name Value Ref Range  Case Report Surgical Pathology Report Case: [id number]  Authorizing Provider:Rebecca Karie Kirks Sawchak,Collected: 01/25/2017 1100  AGNP  Ordering  [facility]IONReceived:01/25/2017 [place] Pathologist: Trudee Kuster,  MD  Specimens: A) - Bone Marrow Right - Aspirate  B) - Bone Marrow Right - Biopsy  C) - Peripheral Blood   Final Diagnosis Bone marrow, right iliac, aspiration and biopsy -  Hypercellular bone marrow (70%) with involvement by mature B-cell lymphoma with non-specific immunophenotype (representing approximately 20% of marrow cellularity by immunohistochemical analysis) (See Comment)  -  Cytogenetic studies are pending.     RADIOGRAPHIC STUDIES: I have personally reviewed the radiological images as listed and agreed with the findings in the report. No results found.  ASSESSMENT & PLAN:   66 year old female was traveling through [place] for the last 4 yrs now with   1) Moderate  splenomegaly at 16.6 cm based on ultrasound on 09/10/2015 in [place]. CT of the abdomen shows improvement in splenomegaly to 14 cm.  2) Newly diagnosed splenic marginal zone lymphoma. Based on peripheral blood flow cytometry. No constitutional symptoms. No anemia. And mild thrombocytopenia with platelets of 102k HIV, hepatitis C and hepatitis B serologies unrevealing  SPEP shows no M spike and negative IFE.  BM Bx done at [facility] -  Hypercellular bone marrow (70%) with involvement by mature B-cell lymphoma with non-specific immunophenotype (representing approximately 20% of marrow cellularity by immunohistochemical analysis) Cytogenetics 7q  deletion  3) Thrombocytopenia - platelet counts last year were 123 k now down to 102 k. Likely due to splenomegaly with hypersplenism due to SMZL  4) Rituxan related Rash - grade 1 PLAN -patient is here for f/u of her SMZL. -she self referred to [facility] and had an evaluation including BM Bx which confirmed Splenic Marginal Zone lymphoma. -in context of new symptoms of night sweats, chills,fatigue (which she reports started after her last clinic visit with [place] and that she did not report to [place]) - she was started on Rituxan on 02/08/2017 at [facility]. -tolerated 1st dose of Rituxan well expect for Grade 1 rash. -she was recommended to inform [facility] team about the rash so they can adjust pre-medications if needed. -I would add steroid and famotidine in addition to tylenol and benadryl as pre-medication and take a non-sedation OTC antihistamine (Cetrizine or loratadine) on subsequent days. -Receiving Rituxan at [facility], will check availability for set-up of treatment here per pt request for Friday 02/15/2017 (if not possible she has been scheduled to get 11/2 Rituxan dose at [facility]).  She will be scheduled for next Friday 02/22/2017 and 03/01/2017.    -Pt  to call us with any concerns or if anything changes   -Rituxan weekly x 3 doses from 02/15/2017 (has received Rituxan on Friday 02/08/2017) and needs to keep it Friday weekly  -Labs weekly   2) left ovarian cyst - 4.2 cm . 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 CA-125 levels were 28.6 on the normal range of 0-35 . Alpha-fetoprotein was within normal limits and 1.8 . CEA level was 1.3 . -This was noted again on CT of abdomen and pelvis  -would recommend GYN evaluation through her primary care physician. Patient notes that she already has one.  3) family history of breast cancer . Patient notes that her sister had breast cancer in her 60s . Has not had genetic testing . No other family history of breast cancer ovarian cancer pancreatic cancer or  melanoma . She has had recent left breast cysts that were aspirated in [place] and per her report were noted to be benign . Plan - continue yearly mammograms with PCP  4) history of depression was previously on Lexapro currently not on any medications . Lost her husband 2009 to throat cancer .  -Rituxan weekly x 3 doses from 02/15/2017 (has received Rituxan on Friday 02/08/2017) and needs to keep it Friday weekly. Please schedule all the remaining 3 doses on Friday. Labs weekly RTC with Dr Irene Limbo with Labs on 02/22/2017   All of the patients questions were answered with apparent satisfaction. The patient knows to call the clinic with any problems, questions or concerns.  I spent 30  minutes counseling the patient face to face. The total time spent in the appointment was 40 minutes and more than 50% was on counseling and direct patient cares.    Sullivan Lone MD [place] AAHIVMS [facility] Hematology/Oncology Physician [place]  (Office):       [phone] (Work cell):  [phone] (Fax):           [phone]  This document serves as a record of services personally performed by Sullivan Lone, MD. It was created on her behalf by Alean Rinne, a trained medical scribe. The creation of this record is based on the scribe's personal observations and the provider's statements to them. This document has been checked and approved by the attending provider.

## 2017-02-11 NOTE — Progress Notes (Signed)
START ON PATHWAY REGIMEN - Lymphoma and CLL     Administer weekly:     Rituximab   **Always confirm dose/schedule in your pharmacy ordering system**  Patient Characteristics: Marginal Zone Lymphoma, Systemic, First Line, Symptomatic Disease Type: Marginal Zone Lymphoma Disease Type: Not Applicable Disease Type: Not Applicable Localized or Systemic Disease<= Systemic [place] Stage: IV Line of Therapy: First Line Asymptomatic or Symptomatic<= Symptomatic Intent of Therapy: Non-Curative / Palliative Intent, Discussed with Patient 

## 2017-02-12 ENCOUNTER — Telehealth: Payer: Self-pay | Admitting: Hematology

## 2017-02-12 LAB — T4, FREE: T4,Free(Direct): 1.11 ng/dL (ref 0.82–1.77)

## 2017-02-12 NOTE — Telephone Encounter (Signed)
Scheduled appt per 10/29 los - added 11/2 treatment day - left message for patient with appt date and time.

## 2017-02-14 ENCOUNTER — Telehealth: Payer: Self-pay | Admitting: Hematology

## 2017-02-15 ENCOUNTER — Telehealth: Payer: Self-pay | Admitting: Medical Oncology

## 2017-02-15 ENCOUNTER — Ambulatory Visit (HOSPITAL_BASED_OUTPATIENT_CLINIC_OR_DEPARTMENT_OTHER): Payer: Medicare HMO

## 2017-02-15 ENCOUNTER — Other Ambulatory Visit (HOSPITAL_BASED_OUTPATIENT_CLINIC_OR_DEPARTMENT_OTHER): Payer: Medicare HMO

## 2017-02-15 ENCOUNTER — Other Ambulatory Visit: Payer: Self-pay | Admitting: Hematology and Oncology

## 2017-02-15 VITALS — BP 115/57 | HR 75 | Temp 98.4°F | Resp 18

## 2017-02-15 DIAGNOSIS — C8587 Other specified types of non-Hodgkin lymphoma, spleen: Secondary | ICD-10-CM | POA: Diagnosis not present

## 2017-02-15 DIAGNOSIS — C8307 Small cell B-cell lymphoma, spleen: Secondary | ICD-10-CM

## 2017-02-15 DIAGNOSIS — Z5112 Encounter for antineoplastic immunotherapy: Secondary | ICD-10-CM | POA: Diagnosis not present

## 2017-02-15 LAB — CBC & DIFF AND RETIC
BASO%: 0.3 % (ref 0.0–2.0)
Basophils Absolute: 0 10*3/uL (ref 0.0–0.1)
EOS ABS: 0.1 10*3/uL (ref 0.0–0.5)
EOS%: 1.2 % (ref 0.0–7.0)
HCT: 41.7 % (ref 34.8–46.6)
HEMOGLOBIN: 13.9 g/dL (ref 11.6–15.9)
IMMATURE RETIC FRACT: 2.8 % (ref 1.60–10.00)
LYMPH#: 1.7 10*3/uL (ref 0.9–3.3)
LYMPH%: 26.1 % (ref 14.0–49.7)
MCH: 30.7 pg (ref 25.1–34.0)
MCHC: 33.3 g/dL (ref 31.5–36.0)
MCV: 92.1 fL (ref 79.5–101.0)
MONO#: 0.4 10*3/uL (ref 0.1–0.9)
MONO%: 5.6 % (ref 0.0–14.0)
NEUT%: 66.8 % (ref 38.4–76.8)
NEUTROS ABS: 4.3 10*3/uL (ref 1.5–6.5)
PLATELETS: 140 10*3/uL — AB (ref 145–400)
RBC: 4.53 10*6/uL (ref 3.70–5.45)
RDW: 13.5 % (ref 11.2–14.5)
RETIC CT ABS: 57.08 10*3/uL (ref 33.70–90.70)
Retic %: 1.26 % (ref 0.70–2.10)
WBC: 6.4 10*3/uL (ref 3.9–10.3)

## 2017-02-15 LAB — COMPREHENSIVE METABOLIC PANEL
ALBUMIN: 4.3 g/dL (ref 3.5–5.0)
ALK PHOS: 75 U/L (ref 40–150)
ALT: 13 U/L (ref 0–55)
AST: 16 U/L (ref 5–34)
Anion Gap: 8 mEq/L (ref 3–11)
BILIRUBIN TOTAL: 0.86 mg/dL (ref 0.20–1.20)
BUN: 17.5 mg/dL (ref 7.0–26.0)
CO2: 28 meq/L (ref 22–29)
Calcium: 9.7 mg/dL (ref 8.4–10.4)
Chloride: 104 mEq/L (ref 98–109)
Creatinine: 0.8 mg/dL (ref 0.6–1.1)
GLUCOSE: 82 mg/dL (ref 70–140)
Potassium: 4.6 mEq/L (ref 3.5–5.1)
SODIUM: 140 meq/L (ref 136–145)
TOTAL PROTEIN: 6.9 g/dL (ref 6.4–8.3)

## 2017-02-15 LAB — URIC ACID: Uric Acid, Serum: 5 mg/dl (ref 2.6–7.4)

## 2017-02-15 MED ORDER — ACETAMINOPHEN 325 MG PO TABS
650.0000 mg | ORAL_TABLET | Freq: Once | ORAL | Status: AC
  Administered 2017-02-15: 650 mg via ORAL

## 2017-02-15 MED ORDER — ACETAMINOPHEN 325 MG PO TABS
ORAL_TABLET | ORAL | Status: AC
  Filled 2017-02-15: qty 1

## 2017-02-15 MED ORDER — DIPHENHYDRAMINE HCL 25 MG PO CAPS
ORAL_CAPSULE | ORAL | Status: AC
  Filled 2017-02-15: qty 2

## 2017-02-15 MED ORDER — DIPHENHYDRAMINE HCL 25 MG PO CAPS
50.0000 mg | ORAL_CAPSULE | Freq: Once | ORAL | Status: AC
  Administered 2017-02-15: 50 mg via ORAL

## 2017-02-15 MED ORDER — DEXAMETHASONE SODIUM PHOSPHATE 10 MG/ML IJ SOLN
INTRAMUSCULAR | Status: AC
  Filled 2017-02-15: qty 1

## 2017-02-15 MED ORDER — SODIUM CHLORIDE 0.9 % IV SOLN
375.0000 mg/m2 | Freq: Once | INTRAVENOUS | Status: AC
  Administered 2017-02-15: 600 mg via INTRAVENOUS
  Filled 2017-02-15: qty 50

## 2017-02-15 MED ORDER — DEXAMETHASONE SODIUM PHOSPHATE 10 MG/ML IJ SOLN
10.0000 mg | Freq: Once | INTRAMUSCULAR | Status: AC
  Administered 2017-02-15: 10 mg via INTRAVENOUS

## 2017-02-15 MED ORDER — SODIUM CHLORIDE 0.9 % IV SOLN
Freq: Once | INTRAVENOUS | Status: AC
  Administered 2017-02-15: 13:00:00 via INTRAVENOUS

## 2017-02-15 MED ORDER — ACETAMINOPHEN 325 MG PO TABS
ORAL_TABLET | ORAL | Status: AC
  Filled 2017-02-15: qty 2

## 2017-02-15 MED ORDER — FAMOTIDINE IN NACL 20-0.9 MG/50ML-% IV SOLN
INTRAVENOUS | Status: AC
  Filled 2017-02-15: qty 50

## 2017-02-15 MED ORDER — PROCHLORPERAZINE MALEATE 10 MG PO TABS: 10.0000 mg | ORAL_TABLET | Freq: Four times a day (QID) | ORAL | 0 refills | Status: DC | PRN

## 2017-02-15 MED ORDER — FAMOTIDINE IN NACL 20-0.9 MG/50ML-% IV SOLN
20.0000 mg | Freq: Once | INTRAVENOUS | Status: AC
  Administered 2017-02-15: 20 mg via INTRAVENOUS

## 2017-02-15 MED ORDER — SODIUM CHLORIDE 0.9 % IV SOLN: 10.0000 mg | Freq: Once | INTRAVENOUS | Status: DC

## 2017-02-15 NOTE — Patient Instructions (Signed)
[facility] Discharge Instructions for Patients Receiving Chemotherapy  Today you received the following chemotherapy agents Rituxan  To help prevent nausea and vomiting after your treatment, we encourage you to take your nausea medication as directed   If you develop nausea and vomiting that is not controlled by your nausea medication, call the clinic.   BELOW ARE SYMPTOMS THAT SHOULD BE REPORTED IMMEDIATELY:  *FEVER GREATER THAN 100.5 F  *CHILLS WITH OR WITHOUT FEVER  NAUSEA AND VOMITING THAT IS NOT CONTROLLED WITH YOUR NAUSEA MEDICATION  *UNUSUAL SHORTNESS OF BREATH  *UNUSUAL BRUISING OR BLEEDING  TENDERNESS IN MOUTH AND THROAT WITH OR WITHOUT PRESENCE OF ULCERS  *URINARY PROBLEMS  *BOWEL PROBLEMS  UNUSUAL RASH Items with * indicate a potential emergency and should be followed up as soon as possible.  Feel free to call the clinic should you have any questions or concerns. The clinic phone number is [phone].  Please show the CHEMO ALERT CARD at check-in to the Emergency Department and triage nurse.    Rituximab injection What is this medicine? RITUXIMAB (ri TUX i mab) is a monoclonal antibody. It is used to treat certain types of cancer like non-Hodgkin lymphoma and chronic lymphocytic leukemia. It is also used to treat rheumatoid arthritis, granulomatosis with polyangiitis (or Wegener's granulomatosis), and microscopic polyangiitis. This medicine may be used for other purposes; ask your health care provider or pharmacist if you have questions. COMMON BRAND NAME(S): Rituxan What should I tell my health care provider before I take this medicine? They need to know if you have any of these conditions: -heart disease -infection (especially a virus infection such as hepatitis B, chickenpox, cold sores, or herpes) -immune system problems -irregular heartbeat -kidney disease -lung or breathing disease, like asthma -recently received or scheduled to  receive a vaccine -an unusual or allergic reaction to rituximab, mouse proteins, other medicines, foods, dyes, or preservatives -pregnant or trying to get pregnant -breast-feeding How should I use this medicine? This medicine is for infusion into a vein. It is administered in a hospital or clinic by a specially trained health care professional. A special MedGuide will be given to you by the pharmacist with each prescription and refill. Be sure to read this information carefully each time. Talk to your pediatrician regarding the use of this medicine in children. This medicine is not approved for use in children. Overdosage: If you think you have taken too much of this medicine contact a poison control center or emergency room at once. NOTE: This medicine is only for you. Do not share this medicine with others. What if I miss a dose? It is important not to miss a dose. Call your doctor or health care professional if you are unable to keep an appointment. What may interact with this medicine? -cisplatin -other medicines for arthritis like disease modifying antirheumatic drugs or tumor necrosis factor inhibitors -live virus vaccines This list may not describe all possible interactions. Give your health care provider a list of all the medicines, herbs, non-prescription drugs, or dietary supplements you use. Also tell them if you smoke, drink alcohol, or use illegal drugs. Some items may interact with your medicine. What should I watch for while using this medicine? Your condition will be monitored carefully while you are receiving this medicine. You may need blood work done while you are taking this medicine. This medicine can cause serious allergic reactions. To reduce your risk you may need to take medicine before treatment with this medicine. Take   your medicine as directed. In some patients, this medicine may cause a serious brain infection that may cause death. If you have any problems seeing,  thinking, speaking, walking, or standing, tell your doctor right away. If you cannot reach your doctor, urgently seek other source of medical care. Call your doctor or health care professional for advice if you get a fever, chills or sore throat, or other symptoms of a cold or flu. Do not treat yourself. This drug decreases your body's ability to fight infections. Try to avoid being around people who are sick. Do not become pregnant while taking this medicine or for 12 months after stopping it. Women should inform their doctor if they wish to become pregnant or think they might be pregnant. There is a potential for serious side effects to an unborn child. Talk to your health care professional or pharmacist for more information. What side effects may I notice from receiving this medicine? Side effects that you should report to your doctor or health care professional as soon as possible: -breathing problems -chest pain -dizziness or feeling faint -fast, irregular heartbeat -low blood counts - this medicine may decrease the number of white blood cells, red blood cells and platelets. You may be at increased risk for infections and bleeding. -mouth sores -redness, blistering, peeling or loosening of the skin, including inside the mouth (this can be added for any serious or exfoliative rash that could lead to hospitalization) -signs of infection - fever or chills, cough, sore throat, pain or difficulty passing urine -signs and symptoms of kidney injury like trouble passing urine or change in the amount of urine -signs and symptoms of liver injury like dark yellow or brown urine; general ill feeling or flu-like symptoms; light-colored stools; loss of appetite; nausea; right upper belly pain; unusually weak or tired; yellowing of the eyes or skin -stomach pain -vomiting Side effects that usually do not require medical attention (report to your doctor or health care professional if they continue or are  bothersome): -headache -joint pain -muscle cramps or muscle pain This list may not describe all possible side effects. Call your doctor for medical advice about side effects. You may report side effects to FDA at 1-800-FDA-1088. Where should I keep my medicine? This drug is given in a hospital or clinic and will not be stored at home. NOTE: This sheet is a summary. It may not cover all possible information. If you have questions about this medicine, talk to your doctor, pharmacist, or health care provider.  2018 Elsevier/Gold Standard (2015-11-09 15:28:09)    

## 2017-02-15 NOTE — Telephone Encounter (Signed)
Left message for  pt to see if she can come in at 1130 or 12 today for rituxan

## 2017-02-22 ENCOUNTER — Ambulatory Visit (HOSPITAL_BASED_OUTPATIENT_CLINIC_OR_DEPARTMENT_OTHER): Payer: Medicare HMO

## 2017-02-22 ENCOUNTER — Other Ambulatory Visit (HOSPITAL_BASED_OUTPATIENT_CLINIC_OR_DEPARTMENT_OTHER): Payer: Medicare HMO

## 2017-02-22 ENCOUNTER — Encounter: Payer: Self-pay | Admitting: Hematology

## 2017-02-22 ENCOUNTER — Telehealth: Payer: Self-pay | Admitting: Hematology

## 2017-02-22 ENCOUNTER — Ambulatory Visit (HOSPITAL_BASED_OUTPATIENT_CLINIC_OR_DEPARTMENT_OTHER): Payer: Medicare HMO | Admitting: Hematology

## 2017-02-22 VITALS — BP 109/51 | HR 69 | Temp 98.8°F | Resp 16

## 2017-02-22 VITALS — BP 114/51 | HR 57 | Temp 98.7°F | Resp 18 | Ht 62.0 in | Wt 122.9 lb

## 2017-02-22 DIAGNOSIS — R161 Splenomegaly, not elsewhere classified: Secondary | ICD-10-CM

## 2017-02-22 DIAGNOSIS — C8307 Small cell B-cell lymphoma, spleen: Secondary | ICD-10-CM

## 2017-02-22 DIAGNOSIS — T148XXA Other injury of unspecified body region, initial encounter: Secondary | ICD-10-CM

## 2017-02-22 DIAGNOSIS — Z803 Family history of malignant neoplasm of breast: Secondary | ICD-10-CM

## 2017-02-22 DIAGNOSIS — D696 Thrombocytopenia, unspecified: Secondary | ICD-10-CM | POA: Diagnosis not present

## 2017-02-22 DIAGNOSIS — Z5112 Encounter for antineoplastic immunotherapy: Secondary | ICD-10-CM

## 2017-02-22 DIAGNOSIS — L271 Localized skin eruption due to drugs and medicaments taken internally: Secondary | ICD-10-CM

## 2017-02-22 DIAGNOSIS — C8587 Other specified types of non-Hodgkin lymphoma, spleen: Secondary | ICD-10-CM

## 2017-02-22 DIAGNOSIS — Z7189 Other specified counseling: Secondary | ICD-10-CM

## 2017-02-22 LAB — CBC & DIFF AND RETIC
BASO%: 0.3 % (ref 0.0–2.0)
BASOS ABS: 0 10*3/uL (ref 0.0–0.1)
EOS ABS: 0.1 10*3/uL (ref 0.0–0.5)
EOS%: 1.7 % (ref 0.0–7.0)
HCT: 41 % (ref 34.8–46.6)
HEMOGLOBIN: 13.7 g/dL (ref 11.6–15.9)
IMMATURE RETIC FRACT: 2.4 % (ref 1.60–10.00)
LYMPH#: 1.7 10*3/uL (ref 0.9–3.3)
LYMPH%: 25.5 % (ref 14.0–49.7)
MCH: 30.6 pg (ref 25.1–34.0)
MCHC: 33.4 g/dL (ref 31.5–36.0)
MCV: 91.7 fL (ref 79.5–101.0)
MONO#: 0.4 10*3/uL (ref 0.1–0.9)
MONO%: 5.9 % (ref 0.0–14.0)
NEUT#: 4.4 10*3/uL (ref 1.5–6.5)
NEUT%: 66.6 % (ref 38.4–76.8)
PLATELETS: 155 10*3/uL (ref 145–400)
RBC: 4.47 10*6/uL (ref 3.70–5.45)
RDW: 13.9 % (ref 11.2–14.5)
RETIC CT ABS: 62.58 10*3/uL (ref 33.70–90.70)
Retic %: 1.4 % (ref 0.70–2.10)
WBC: 6.6 10*3/uL (ref 3.9–10.3)

## 2017-02-22 LAB — COMPREHENSIVE METABOLIC PANEL
ALBUMIN: 3.9 g/dL (ref 3.5–5.0)
ALK PHOS: 76 U/L (ref 40–150)
ALT: 10 U/L (ref 0–55)
AST: 13 U/L (ref 5–34)
Anion Gap: 9 mEq/L (ref 3–11)
BUN: 11.6 mg/dL (ref 7.0–26.0)
CHLORIDE: 107 meq/L (ref 98–109)
CO2: 23 meq/L (ref 22–29)
Calcium: 9.1 mg/dL (ref 8.4–10.4)
Creatinine: 0.7 mg/dL (ref 0.6–1.1)
GLUCOSE: 68 mg/dL — AB (ref 70–140)
Potassium: 3.7 mEq/L (ref 3.5–5.1)
Sodium: 139 mEq/L (ref 136–145)
Total Bilirubin: 0.7 mg/dL (ref 0.20–1.20)
Total Protein: 6.4 g/dL (ref 6.4–8.3)

## 2017-02-22 MED ORDER — DEXAMETHASONE SODIUM PHOSPHATE 10 MG/ML IJ SOLN
10.0000 mg | Freq: Once | INTRAMUSCULAR | Status: AC
  Administered 2017-02-22: 10 mg via INTRAVENOUS

## 2017-02-22 MED ORDER — ACETAMINOPHEN 325 MG PO TABS
ORAL_TABLET | ORAL | Status: AC
Start: 2017-02-22 — End: ?
  Filled 2017-02-22: qty 2

## 2017-02-22 MED ORDER — SODIUM CHLORIDE 0.9 % IV SOLN
375.0000 mg/m2 | Freq: Once | INTRAVENOUS | Status: AC
  Administered 2017-02-22: 600 mg via INTRAVENOUS
  Filled 2017-02-22: qty 60

## 2017-02-22 MED ORDER — ACETAMINOPHEN 325 MG PO TABS
650.0000 mg | ORAL_TABLET | Freq: Once | ORAL | Status: AC
  Administered 2017-02-22: 650 mg via ORAL

## 2017-02-22 MED ORDER — DIPHENHYDRAMINE HCL 25 MG PO CAPS
ORAL_CAPSULE | ORAL | Status: AC
  Filled 2017-02-22: qty 2

## 2017-02-22 MED ORDER — SODIUM CHLORIDE 0.9 % IV SOLN
Freq: Once | INTRAVENOUS | Status: AC
  Administered 2017-02-22: 10:00:00 via INTRAVENOUS

## 2017-02-22 MED ORDER — FAMOTIDINE IN NACL 20-0.9 MG/50ML-% IV SOLN
INTRAVENOUS | Status: AC
  Filled 2017-02-22: qty 50

## 2017-02-22 MED ORDER — FAMOTIDINE IN NACL 20-0.9 MG/50ML-% IV SOLN
20.0000 mg | Freq: Once | INTRAVENOUS | Status: AC
  Administered 2017-02-22: 20 mg via INTRAVENOUS

## 2017-02-22 MED ORDER — DEXAMETHASONE SODIUM PHOSPHATE 10 MG/ML IJ SOLN
INTRAMUSCULAR | Status: AC
  Filled 2017-02-22: qty 1

## 2017-02-22 MED ORDER — DIPHENHYDRAMINE HCL 25 MG PO CAPS
50.0000 mg | ORAL_CAPSULE | Freq: Once | ORAL | Status: AC
  Administered 2017-02-22: 50 mg via ORAL

## 2017-02-22 NOTE — Telephone Encounter (Signed)
Patient already on schedule for appointments as requested per 11/9 los. Patient will get print out in infusion area.

## 2017-02-22 NOTE — Patient Instructions (Signed)
[facility] Discharge Instructions for Patients Receiving Chemotherapy  Today you received the following chemotherapy agents:  Rituxan   To help prevent nausea and vomiting after your treatment, we encourage you to take your nausea medication as prescribed.   If you develop nausea and vomiting that is not controlled by your nausea medication, call the clinic.   BELOW ARE SYMPTOMS THAT SHOULD BE REPORTED IMMEDIATELY:  *FEVER GREATER THAN 100.5 F  *CHILLS WITH OR WITHOUT FEVER  NAUSEA AND VOMITING THAT IS NOT CONTROLLED WITH YOUR NAUSEA MEDICATION  *UNUSUAL SHORTNESS OF BREATH  *UNUSUAL BRUISING OR BLEEDING  TENDERNESS IN MOUTH AND THROAT WITH OR WITHOUT PRESENCE OF ULCERS  *URINARY PROBLEMS  *BOWEL PROBLEMS  UNUSUAL RASH Items with * indicate a potential emergency and should be followed up as soon as possible.  Feel free to call the clinic should you have any questions or concerns. The clinic phone number is [phone].  Please show the CHEMO ALERT CARD at check-in to the Emergency Department and triage nurse.   

## 2017-02-25 DIAGNOSIS — Z7189 Other specified counseling: Secondary | ICD-10-CM | POA: Insufficient documentation

## 2017-02-25 NOTE — Progress Notes (Signed)
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 female who has been self referred to [place] for evaluation and management of newly noted splenomegaly.  Patient does not have many chronic medical comorbidities. 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 She also visited [place], [place], [place] and a few other places last 4 years.  A couple months ago while still in [place] she noticed some fullness in the upper and left upper abdomen and had an ultrasound which showed splenomegaly with the spleen length of 16.6 cm with no overt focal splenic lesions. No overt abdominal adenopathy noted. No free fluid. Liver, gallbladder, pancreas, both kidneys loss of bowel were noted to be within normal limits. There was incidental finding of a 4.2 cm thin-walled unilocular left ovarian cyst with no solid components. Normal uterus and right ovary was seen.  Her blood counts done on 09/02/2015 showed normal hemoglobin of 14 with an MCV of 91, WBC count of 5.9k with normal differential. Platelet clumping was noted with reported count of 103k. Malaria parasite smear was done and no other parasites were noted.  INTERVAL HISTORY  Joanne Miller is here for follow-up of her Splenic marginal zone lymphoma.and prior to her 3rd weekly dose of Rituxan. She had no skin rashes with 2nd dose of Rituxan with adjusted pre-medications. She notes that she is feeling better with improvement in her fatigue and night sweats. She notes she strained an abdominal muscle after trying to lift something.  No CP/SOB/pruiritus. No other prohibitive toxicities from Rituxan. Thrombocytopenia  has resolved. She notes that she plans to travel to [place] in Dec 2018.  On review of systems,pt reports decreased taste, fatigue, night sweats, aches to her legs, weight loss, chills,mild abdominal pain, and denies fever, abdominal pain and any other accompanying symptoms.   MEDICAL HISTORY:   #1 Dyslipidemia - not on any medications #2 GERD #3 hot flashes menopausal status was previously on Climara patch #4 history of sebaceous cysts. #5 history of recent left breast cysts - aspirated by ultrasound guidance and noted to be negative for cancer. #6 family history of breast cancer in her sister when she was in her 26s. No genetic testing done at that time.  SURGICAL HISTORY:  #1 breast cysts 2 aspiration on the left side . #2 root canal treatments #3 sebaceous cyst excision   L HISTORY: Social History   Socioeconomic History  . Marital status: Married    Spouse name: Not on file  . Number of children: Not on file  . Years of education: Not on file  . Highest education level: Not on file  Social Needs  . Financial resource strain: Not on file  . Food insecurity - worry: Not on file  . Food insecurity - inability: Not on file  . Transportation needs - medical: Not on file  . Transportation needs - non-medical: Not on file  Occupational History  . Not on file  Tobacco Use  . Smoking status: Never Smoker  . Smokeless tobacco: Never Used  Substance and Sexual Activity  . Alcohol use: No  . Drug use: No  . Sexual activity:  Not on file  Other Topics Concern  . Not on file  Social History Narrative  . Not on file    FAMILY HISTORY: History reviewed. No pertinent family history.  ALLERGIES:  is allergic to codeine.  MEDICATIONS:  Current Outpatient Medications  Medication Sig Dispense Refill  . aspirin 81 MG EC tablet Take 81 mg by mouth daily. Swallow whole.    . Misc Natural Products (TART CHERRY ADVANCED PO) Take by mouth.    . Omega-3 Fatty Acids (FISH OIL)  1200 MG CAPS Take by mouth.    . prochlorperazine (COMPAZINE) 10 MG tablet Take 1 tablet (10 mg total) by mouth every 6 (six) hours as needed for nausea or vomiting. 50 tablet 0   No current facility-administered medications for this visit.     REVIEW OF SYSTEMS:    10 Point review of Systems was done is negative except as noted above.  PHYSICAL EXAMINATION: ECOG PERFORMANCE STATUS: 0 - Asymptomatic  . Vitals:   02/22/17 0856  BP: (!) 114/51  Pulse: (!) 57  Resp: 18  Temp: 98.7 F (37.1 C)  SpO2: 99%   Filed Weights   02/22/17 0856  Weight: 122 lb 14.4 oz (55.7 kg)   .Body mass index is 22.48 kg/m.  GENERAL:alert, in no acute distress and comfortable SKIN: skin color, texture, turgor are normal, no rashes or significant lesions EYES: normal, conjunctiva are pink and non-injected, sclera clear OROPHARYNX:no exudate, no erythema and lips, buccal mucosa, and tongue normal  NECK: supple, no JVD, thyroid normal size, non-tender, without nodularity LYMPH:  no palpable lymphadenopathy in the cervical, axillary or inguinal LUNGS: clear to auscultation with normal respiratory effort HEART: regular rate & rhythm,  no murmurs and no lower extremity edema ABDOMEN: abdomen soft, non-tender, normoactive bowel sounds , No palpable hepatomegaly . Spleen palpable about 2 cm under the left costal margin in the midclavicular line . Musculoskeletal: no cyanosis of digits and no clubbing  PSYCH: alert & oriented x 3 with fluent speech NEURO: no focal motor/sensory deficits  LABORATORY DATA:  I have reviewed the data as listed  . CBC Latest Ref Rng & Units 02/22/2017 02/15/2017 02/11/2017  WBC 3.9 - 10.3 10e3/uL 6.6 6.4 5.3  Hemoglobin 11.6 - 15.9 g/dL 13.7 13.9 14.3  Hematocrit 34.8 - 46.6 % 41.0 41.7 43.4  Platelets 145 - 400 10e3/uL 155 140(L) 102(L)   . CBC    Component Value Date/Time   WBC 6.6 02/22/2017 0833   RBC 4.47 02/22/2017 0833   HGB 13.7 02/22/2017 0833   HCT 41.0  02/22/2017 0833   PLT 155 02/22/2017 0833   MCV 91.7 02/22/2017 0833   MCH 30.6 02/22/2017 0833   MCHC 33.4 02/22/2017 0833   RDW 13.9 02/22/2017 0833   LYMPHSABS 1.7 02/22/2017 0833   MONOABS 0.4 02/22/2017 0833   EOSABS 0.1 02/22/2017 0833   BASOSABS 0.0 02/22/2017 0833    . CMP Latest Ref Rng & Units 02/22/2017 02/15/2017 02/11/2017  Glucose 70 - 140 mg/dl 68(L) 82 84  BUN 7.0 - 26.0 mg/dL 11.6 17.5 20.4  Creatinine 0.6 - 1.1 mg/dL 0.7 0.8 0.8  Sodium 136 - 145 mEq/L 139 140 140  Potassium 3.5 - 5.1 mEq/L 3.7 4.6 4.2  CO2 22 - 29 mEq/L '23 28 24  ' Calcium 8.4 - 10.4 mg/dL 9.1 9.7 9.9  Total Protein 6.4 - 8.3 g/dL 6.4 6.9 6.9  Total Bilirubin 0.20 - 1.20 mg/dL 0.70 0.86 0.52  Alkaline Phos 40 - 150 U/L  76 75 71  AST 5 - 34 U/L '13 16 17  ' ALT 0 - 55 U/L '10 13 11   ' Component     Latest Ref Rng & Units 02/11/2017  LDH     125 - 245 U/L 233  TSH     0.308 - 3.960 m(IU)/L 2.186         Hematopathology Order10/03/2017 [place]  BONE MARROW BIOPSY 01/25/2017: Interpretation Fourteen of the twenty metaphase cells analyzed are normal (46,XX). Six cells are abnormal and contain a deletion within the long arm of chromosome 7 and additional material of unknown origin attached to the long arm of chromosome 14 at or near the [facility] locus. Deletion of 7q is seen in mature B-cell neoplasms including marginal zone lymphoma. Ten cells were analyzed from the 24 hour unstimulated culture and ten cells were analyzed from the 72 hour culture stimulated with pokeweed mitogen, phorbol 12-myristate 13-acetate (PMA), and a CpG-oligodeoxynucleotide (ODN). The abnormal clone was only seen in the 72 hour stimulated culture.   Component Name Value Ref Range  Case Report Surgical Pathology Report Case: [id number]  Authorizing Provider:Rebecca Karie Kirks Sawchak,Collected: 01/25/2017 1100   AGNP  [facility]ing [facility]IONReceived:01/25/2017 [place] Pathologist: Trudee Kuster,  MD  Specimens: A) - Bone Marrow Right - Aspirate  B) - Bone Marrow Right - Biopsy  C) - Peripheral Blood   Final Diagnosis Bone marrow, right iliac, aspiration and biopsy -  Hypercellular bone marrow (70%) with involvement by mature B-cell lymphoma with non-specific immunophenotype (representing approximately 20% of marrow cellularity by immunohistochemical analysis) (See Comment)  -  Cytogenetic studies are pending.     RADIOGRAPHIC STUDIES: I have personally reviewed the radiological images as listed and agreed with the findings in the report. No results found.  ASSESSMENT & PLAN:   66 year old female with   1) Moderate  splenomegaly at 16.6 cm based on ultrasound on 09/10/2015 in [place]. CT of the abdomen shows improvement in splenomegaly to 14 cm.  2) Newly diagnosed splenic marginal zone lymphoma. Based on peripheral blood flow cytometry. With constitutional sypmtoms fatigue and night sweats - now resolved with Rituxan treatment. No anemia. And mild thrombocytopenia with platelets of 102k now resolved to 155k HIV, hepatitis C and hepatitis B serologies unrevealing  SPEP shows no M spike and negative IFE.  BM Bx done at [facility] -  Hypercellular bone marrow (70%) with involvement by mature B-cell lymphoma with non-specific  immunophenotype (representing approximately 20% of marrow cellularity by immunohistochemical analysis) Cytogenetics 7q deletion  3) Thrombocytopenia -  Likely due to splenomegaly with hypersplenism due to SMZL. Now resolved with platelet counts improved to 155k  4) Rituxan related Rash - grade 1. Resolved and did not recur with dose 2 with adjusted premedications.  PLAN -patient is here for f/u of her SMZL and prior to her 3rd weekly dose of Rituxan -received 1st dose [facility] and 2nd dose her . No prohibitive toxicities or recurrent rash after 2nd dose of Rituxan. -appropriate to proceed with 3rd dose of Rituxan with current premedicagtions. (steroid and famotidine in addition to tylenol and benadryl as pre-medication) and take a non-sedation OTC antihistamine (Cetrizine or loratadine) on subsequent days.    2) left ovarian cyst - 4.2 cm . 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 CA-125 levels were 28.6 on the normal range of 0-35 . Alpha-fetoprotein was within normal limits and 1.8 . CEA level was 1.3 . -This was noted again on CT  of abdomen and pelvis  -would recommend GYN evaluation through her primary care physician. Patient notes that she already has one.  3) family history of breast cancer . Patient notes that her sister had breast cancer in her 53s . Has not had genetic testing . No other family history of breast cancer ovarian cancer pancreatic cancer or melanoma . She has had recent left breast cysts that were aspirated in [place] and per her report were noted to be benign . Plan - continue yearly mammograms with PCP  4) history of depression was previously on Lexapro currently not on any medications . Lost her husband 2009 to throat cancer .  F/u for 4th dose of Rituxan as scheduled on 03/01/2017 F/u as per scheduled clinic visit on 03/22/2017 with labs   All of the patients questions were answered with apparent satisfaction. The patient knows to call the clinic with any  problems, questions or concerns.  I spent 20  minutes counseling the patient face to face. The total time spent in the appointment was 25 minutes and more than 50% was on counseling and direct patient cares.    .Patient was Personally and independently interviewed, examined and relevant elements of the history of present illness were reviewed in details and an assessment and plan was created. All elements of the patient's history of present illness , assessment and plan were discussed in details with **. The above documentation reflects our combined findings assessment and plan.  Sullivan Lone MD Joanne  .I have reviewed the above documentation for accuracy and completeness, and I agree with the above.  Sullivan Lone MD [place] AAHIVMS [facility] Hematology/Oncology Physician [place]  (Office):       [phone] (Work cell):  [phone] (Fax):           [phone]  This document serves as a record of services personally performed by Sullivan Lone, MD. It was created on her behalf by Alean Rinne, a trained medical scribe. The creation of this record is based on the scribe's personal observations and the provider's statements to them. This document has been checked and approved by the attending provider.

## 2017-03-01 ENCOUNTER — Ambulatory Visit (HOSPITAL_COMMUNITY)
Admission: RE | Admit: 2017-03-01 | Discharge: 2017-03-01 | Disposition: A | Discharge status: Home / Self Care | Payer: Medicare HMO | Source: Ambulatory Visit | Attending: Hematology | Admitting: Hematology

## 2017-03-01 ENCOUNTER — Ambulatory Visit (HOSPITAL_BASED_OUTPATIENT_CLINIC_OR_DEPARTMENT_OTHER): Payer: Medicare HMO | Admitting: Hematology

## 2017-03-01 ENCOUNTER — Other Ambulatory Visit: Payer: Self-pay | Admitting: Hematology

## 2017-03-01 ENCOUNTER — Other Ambulatory Visit (HOSPITAL_BASED_OUTPATIENT_CLINIC_OR_DEPARTMENT_OTHER): Payer: Medicare HMO

## 2017-03-01 ENCOUNTER — Ambulatory Visit (HOSPITAL_BASED_OUTPATIENT_CLINIC_OR_DEPARTMENT_OTHER): Payer: Medicare HMO

## 2017-03-01 VITALS — BP 106/51 | HR 60 | Temp 99.4°F | Resp 18

## 2017-03-01 DIAGNOSIS — C8307 Small cell B-cell lymphoma, spleen: Secondary | ICD-10-CM

## 2017-03-01 DIAGNOSIS — C8597 Non-Hodgkin lymphoma, unspecified, spleen: Secondary | ICD-10-CM | POA: Insufficient documentation

## 2017-03-01 DIAGNOSIS — Z803 Family history of malignant neoplasm of breast: Secondary | ICD-10-CM | POA: Diagnosis not present

## 2017-03-01 DIAGNOSIS — R1010 Upper abdominal pain, unspecified: Secondary | ICD-10-CM | POA: Diagnosis not present

## 2017-03-01 DIAGNOSIS — R0789 Other chest pain: Secondary | ICD-10-CM | POA: Diagnosis not present

## 2017-03-01 DIAGNOSIS — Z5112 Encounter for antineoplastic immunotherapy: Secondary | ICD-10-CM

## 2017-03-01 DIAGNOSIS — R161 Splenomegaly, not elsewhere classified: Secondary | ICD-10-CM | POA: Diagnosis not present

## 2017-03-01 DIAGNOSIS — D696 Thrombocytopenia, unspecified: Secondary | ICD-10-CM

## 2017-03-01 DIAGNOSIS — C8587 Other specified types of non-Hodgkin lymphoma, spleen: Secondary | ICD-10-CM

## 2017-03-01 DIAGNOSIS — Z7189 Other specified counseling: Secondary | ICD-10-CM

## 2017-03-01 DIAGNOSIS — R079 Chest pain, unspecified: Secondary | ICD-10-CM | POA: Diagnosis not present

## 2017-03-01 DIAGNOSIS — R109 Unspecified abdominal pain: Secondary | ICD-10-CM | POA: Diagnosis not present

## 2017-03-01 LAB — CBC & DIFF AND RETIC
BASO%: 0.3 % (ref 0.0–2.0)
BASOS ABS: 0 10*3/uL (ref 0.0–0.1)
EOS%: 1.7 % (ref 0.0–7.0)
Eosinophils Absolute: 0.1 10*3/uL (ref 0.0–0.5)
HEMATOCRIT: 42.4 % (ref 34.8–46.6)
HGB: 14 g/dL (ref 11.6–15.9)
Immature Retic Fract: 2.6 % (ref 1.60–10.00)
LYMPH#: 1.7 10*3/uL (ref 0.9–3.3)
LYMPH%: 24.9 % (ref 14.0–49.7)
MCH: 30.4 pg (ref 25.1–34.0)
MCHC: 33 g/dL (ref 31.5–36.0)
MCV: 92 fL (ref 79.5–101.0)
MONO#: 0.5 10*3/uL (ref 0.1–0.9)
MONO%: 7.3 % (ref 0.0–14.0)
NEUT#: 4.6 10*3/uL (ref 1.5–6.5)
NEUT%: 65.8 % (ref 38.4–76.8)
Platelets: 142 10*3/uL — ABNORMAL LOW (ref 145–400)
RBC: 4.61 10*6/uL (ref 3.70–5.45)
RDW: 14.3 % (ref 11.2–14.5)
RETIC %: 1.82 % (ref 0.70–2.10)
RETIC CT ABS: 83.9 10*3/uL (ref 33.70–90.70)
WBC: 7 10*3/uL (ref 3.9–10.3)

## 2017-03-01 LAB — COMPREHENSIVE METABOLIC PANEL
ALT: 11 U/L (ref 0–55)
AST: 14 U/L (ref 5–34)
Albumin: 3.9 g/dL (ref 3.5–5.0)
Alkaline Phosphatase: 84 U/L (ref 40–150)
Anion Gap: 10 mEq/L (ref 3–11)
BUN: 13.1 mg/dL (ref 7.0–26.0)
CHLORIDE: 104 meq/L (ref 98–109)
CO2: 24 meq/L (ref 22–29)
CREATININE: 0.8 mg/dL (ref 0.6–1.1)
Calcium: 9.3 mg/dL (ref 8.4–10.4)
EGFR: >60 mL/min/{1.73_m2} (ref >60)
GLUCOSE: 65 mg/dL — AB (ref 70–140)
POTASSIUM: 3.8 meq/L (ref 3.5–5.1)
SODIUM: 137 meq/L (ref 136–145)
Total Bilirubin: 0.82 mg/dL (ref 0.20–1.20)
Total Protein: 6.7 g/dL (ref 6.4–8.3)

## 2017-03-01 MED ORDER — ACETAMINOPHEN 325 MG PO TABS
ORAL_TABLET | ORAL | Status: AC
  Filled 2017-03-01: qty 2

## 2017-03-01 MED ORDER — IOPAMIDOL (ISOVUE-370) INJECTION 76%
INTRAVENOUS | Status: AC
  Filled 2017-03-01: qty 100

## 2017-03-01 MED ORDER — FAMOTIDINE IN NACL 20-0.9 MG/50ML-% IV SOLN
20.0000 mg | Freq: Once | INTRAVENOUS | Status: AC
  Administered 2017-03-01: 20 mg via INTRAVENOUS

## 2017-03-01 MED ORDER — SODIUM CHLORIDE 0.9 % IV SOLN
375.0000 mg/m2 | Freq: Once | INTRAVENOUS | Status: AC
  Administered 2017-03-01: 600 mg via INTRAVENOUS
  Filled 2017-03-01: qty 10

## 2017-03-01 MED ORDER — DEXAMETHASONE SODIUM PHOSPHATE 10 MG/ML IJ SOLN
INTRAMUSCULAR | Status: AC
  Filled 2017-03-01: qty 1

## 2017-03-01 MED ORDER — FAMOTIDINE IN NACL 20-0.9 MG/50ML-% IV SOLN
INTRAVENOUS | Status: AC
  Filled 2017-03-01: qty 50

## 2017-03-01 MED ORDER — DIPHENHYDRAMINE HCL 25 MG PO CAPS
50.0000 mg | ORAL_CAPSULE | Freq: Once | ORAL | Status: AC
  Administered 2017-03-01: 50 mg via ORAL

## 2017-03-01 MED ORDER — DIPHENHYDRAMINE HCL 25 MG PO CAPS
ORAL_CAPSULE | ORAL | Status: AC
  Filled 2017-03-01: qty 2

## 2017-03-01 MED ORDER — SODIUM CHLORIDE 0.9 % IV SOLN
Freq: Once | INTRAVENOUS | Status: AC
  Administered 2017-03-01: 10:00:00 via INTRAVENOUS

## 2017-03-01 MED ORDER — ACETAMINOPHEN 325 MG PO TABS
650.0000 mg | ORAL_TABLET | Freq: Once | ORAL | Status: AC
  Administered 2017-03-01: 650 mg via ORAL

## 2017-03-01 MED ORDER — IOPAMIDOL (ISOVUE-370) INJECTION 76%
100.0000 mL | Freq: Once | INTRAVENOUS | Status: AC | PRN
  Administered 2017-03-01: 100 mL via INTRAVENOUS

## 2017-03-01 MED ORDER — DEXAMETHASONE SODIUM PHOSPHATE 10 MG/ML IJ SOLN
10.0000 mg | Freq: Once | INTRAMUSCULAR | Status: AC
  Administered 2017-03-01: 10 mg via INTRAVENOUS

## 2017-03-01 NOTE — Progress Notes (Signed)
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 female who has been self referred to [place] for evaluation and management of newly noted splenomegaly.  Patient does not have many chronic medical comorbidities. 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 She also visited [place], [place], [place] and a few other places last 4 years.  A couple months ago while still in [place] she noticed some fullness in the upper and left upper abdomen and had an ultrasound which showed splenomegaly with the spleen length of 16.6 cm with no overt focal splenic lesions. No overt abdominal adenopathy noted. No free fluid. Liver, gallbladder, pancreas, both kidneys loss of bowel were noted to be within normal limits. There was incidental finding of a 4.2 cm thin-walled unilocular left ovarian cyst with no solid components. Normal uterus and right ovary was seen.  Her blood counts done on 09/02/2015 showed normal hemoglobin of 14 with an MCV of 91, WBC count of 5.9k with normal differential. Platelet clumping was noted with reported count of 103k. Malaria parasite smear was done and no other parasites were noted.  INTERVAL HISTORY  Joanne Miller is here for follow-up of her Splenic marginal zone lymphoma.and prior to her 4th weekly dose of Rituxan. She was seen today as an add-on patient since she notes that her left lower chest wall and left upper abdominal discomfort has significantly increased and is limiting her movements.  She has been using an abdominal belt.  Did not take any over-the-counter pain medications.  Did not use the  over-the-counter lidocaine patches as recommended.  No overt pleuritic discomfort.  Has tenderness to palpation over the left lower anterior chest wall and left upper abdomen.  No change in bowel habits.  Pain is primarily with movement.  She is concerned that it is getting worse.  We discussed that this is likely still a muscle strain though given her concerns and worsening symptoms we discussed getting CT of the chest and abdomen to rule out any left lower lung pathology or splenic infarction or any signs of injury.  She is agreeable to this plan and if this was ordered stat and she will be done today.  No CP/SOB/pruiritus. No other prohibitive toxicities from Rituxan. Thrombocytopenia has resolved. She notes that she plans to travel to [place] in Dec 2018.  On review of systems,pt reports decreased taste, fatigue, night sweats, aches to her legs, weight loss, chills,mild abdominal pain, and denies fever, abdominal pain and any other accompanying symptoms.   MEDICAL HISTORY:   #1 Dyslipidemia - not on any medications #2 GERD #3 hot flashes menopausal status was previously on [place] patch #4 history of sebaceous cysts. #5 history of recent left breast cysts - aspirated by ultrasound guidance and noted to be negative for cancer. #6 family history of breast cancer in her sister when she was in her 59s. No genetic testing done at that time.  SURGICAL HISTORY:  #1 breast cysts 2 aspiration on the left side . #2 root canal treatments #3 sebaceous cyst excision   L HISTORY: Social History   Socioeconomic History  .  Marital status: Widowed    Spouse name: Not on file  . Number of children: Not on file  . Years of education: Not on file  . Highest education level: Not on file  Social Needs  . Financial resource strain: Not on file  . Food insecurity - worry: Not on file  . Food insecurity - inability: Not on file  . Transportation needs - medical: Not on file  . Transportation  needs - non-medical: Not on file  Occupational History  . Not on file  Tobacco Use  . Smoking status: Never Smoker  . Smokeless tobacco: Never Used  Substance and Sexual Activity  . Alcohol use: No  . Drug use: No  . Sexual activity: Not on file  Other Topics Concern  . Not on file  Social History Narrative  . Not on file    FAMILY HISTORY: No family history on file.  ALLERGIES:  is allergic to codeine.  MEDICATIONS:  Current Outpatient Medications  Medication Sig Dispense Refill  . aspirin 81 MG EC tablet Take 81 mg by mouth daily. Swallow whole.    . Misc Natural Products (TART CHERRY ADVANCED PO) Take by mouth.    . Omega-3 Fatty Acids (FISH OIL) 1200 MG CAPS Take by mouth.    . prochlorperazine (COMPAZINE) 10 MG tablet Take 1 tablet (10 mg total) by mouth every 6 (six) hours as needed for nausea or vomiting. 50 tablet 0   No current facility-administered medications for this visit.    Facility-Administered Medications Ordered in Other Visits  Medication Dose Route Frequency Provider Last Rate Last Dose  . iopamidol (ISOVUE-370) 76 % injection             REVIEW OF SYSTEMS:    10 Point review of Systems was done is negative except as noted above.  PHYSICAL EXAMINATION: ECOG PERFORMANCE STATUS: 0 - Asymptomatic VS reviewed and are stable.  GENERAL:alert, in no acute distress and comfortable SKIN: skin color, texture, turgor are normal, no rashes or significant lesions EYES: normal, conjunctiva are pink and non-injected, sclera clear OROPHARYNX:no exudate, no erythema and lips, buccal mucosa, and tongue normal  NECK: supple, no JVD, thyroid normal size, non-tender, without nodularity LYMPH:  no palpable lymphadenopathy in the cervical, axillary or inguinal LUNGS: clear to auscultation with normal respiratory effort. TTP over with left anterior lower chest wall and left upper abdomen. HEART: regular rate & rhythm,  no murmurs and no lower extremity edema,  ABDOMEN:  abdomen soft, non-tender, normoactive bowel sounds , No palpable hepatomegaly . TTP over with left anterior lower chest wall and left upper abdomen. Musculoskeletal: no cyanosis of digits and no clubbing  PSYCH: alert & oriented x 3 with fluent speech NEURO: no focal motor/sensory deficits  LABORATORY DATA:  I have reviewed the data as listed  . CBC Latest Ref Rng & Units 03/01/2017 02/22/2017 02/15/2017  WBC 3.9 - 10.3 10e3/uL 7.0 6.6 6.4  Hemoglobin 11.6 - 15.9 g/dL 14.0 13.7 13.9  Hematocrit 34.8 - 46.6 % 42.4 41.0 41.7  Platelets 145 - 400 10e3/uL 142(L) 155 140(L)   . CBC    Component Value Date/Time   WBC 7.0 03/01/2017 0845   RBC 4.61 03/01/2017 0845   HGB 14.0 03/01/2017 0845   HCT 42.4 03/01/2017 0845   PLT 142 (L) 03/01/2017 0845   MCV 92.0 03/01/2017 0845   MCH 30.4 03/01/2017 0845   MCHC 33.0 03/01/2017 0845   RDW 14.3 03/01/2017 0845   LYMPHSABS 1.7 03/01/2017  0845   MONOABS 0.5 03/01/2017 0845   EOSABS 0.1 03/01/2017 0845   BASOSABS 0.0 03/01/2017 0845    . CMP Latest Ref Rng & Units 03/01/2017 02/22/2017 02/15/2017  Glucose 70 - 140 mg/dl 65(L) 68(L) 82  BUN 7.0 - 26.0 mg/dL 13.1 11.6 17.5  Creatinine 0.6 - 1.1 mg/dL 0.8 0.7 0.8  Sodium 136 - 145 mEq/L 137 139 140  Potassium 3.5 - 5.1 mEq/L 3.8 3.7 4.6  CO2 22 - 29 mEq/L '24 23 28  ' Calcium 8.4 - 10.4 mg/dL 9.3 9.1 9.7  Total Protein 6.4 - 8.3 g/dL 6.7 6.4 6.9  Total Bilirubin 0.20 - 1.20 mg/dL 0.82 0.70 0.86  Alkaline Phos 40 - 150 U/L 84 76 75  AST 5 - 34 U/L '14 13 16  ' ALT 0 - 55 U/L '11 10 13   ' Component     Latest Ref Rng & Units 02/11/2017  LDH     125 - 245 U/L 233  TSH     0.308 - 3.960 m(IU)/L 2.186         Hematopathology Order10/03/2017 [place] [facility] 01/25/2017: Interpretation Fourteen of the twenty metaphase cells analyzed are normal (46,XX). Six cells are abnormal and contain a deletion within the long arm of chromosome 7 and additional material of unknown  origin attached to the long arm of chromosome 14 at or near the [facility] locus. Deletion of 7q is seen in mature B-cell neoplasms including marginal zone lymphoma. Ten cells were analyzed from the 24 hour unstimulated culture and ten cells were analyzed from the 72 hour culture stimulated with pokeweed mitogen, phorbol 12-myristate 13-acetate (PMA), and a CpG-oligodeoxynucleotide (ODN). The abnormal clone was only seen in the 72 hour stimulated culture.   Component Name Value Ref Range  Case Report Surgical Pathology Report Case: [id number]  Authorizing Provider:Rebecca Karie Kirks Sawchak,Collected: 01/25/2017 1100  AGNP  [facility]ing [facility]IONReceived:01/25/2017 [place] Pathologist: Trudee Kuster,  MD  Specimens: A) - Bone Marrow Right - Aspirate  B) - Bone Marrow Right - Biopsy  C) - Peripheral Blood   Final Diagnosis Bone marrow, right iliac, aspiration and biopsy -  Hypercellular bone marrow (70%) with involvement by mature B-cell lymphoma with non-specific immunophenotype (representing approximately 20% of marrow cellularity by immunohistochemical analysis) (See Comment)  -  Cytogenetic studies are pending.     RADIOGRAPHIC STUDIES: I have personally reviewed the radiological images as listed and agreed with the findings in the  report. No results found.  ASSESSMENT & PLAN:   66 year old female with   1) Moderate  splenomegaly at 16.6 cm based on ultrasound on 09/10/2015 in [place]. CT of the abdomen shows improvement in splenomegaly to 14 cm.  2) Newly diagnosed splenic marginal zone lymphoma. Based on peripheral blood flow cytometry. With constitutional sypmtoms fatigue and night sweats - now resolved with Rituxan treatment. No anemia. And mild thrombocytopenia with platelets of 102k now resolved to 155k HIV, hepatitis C and hepatitis B serologies unrevealing  SPEP shows no M spike and negative IFE.  BM Bx done at [facility] -  Hypercellular bone marrow (70%) with involvement by mature B-cell lymphoma with non-specific immunophenotype (representing approximately 20% of marrow cellularity by immunohistochemical analysis) Cytogenetics 7q deletion  3) Thrombocytopenia -  Likely due to splenomegaly with hypersplenism due to SMZL. Now resolved with platelet counts improved to 140k  4) Rituxan related Rash - grade 1. Resolved and did not recur with dose 2 and 3 with  adjusted premedications.  5) Worsening Left anterior lower chest wall and left upper abdomen discomfort -- likely muscle strain with persistent discomfort PLAN -patient is here for f/u of her SMZL and evaluation of worsening TTP over with left anterior lower chest wall and left upper abdomen pain. -labs stable.  appropriate to proceed with 4th weekly dose of Rituxan with current premedicagtions. (steroid and famotidine in addition to tylenol and benadryl as pre-medication) and take a non-sedation OTC antihistamine (Cetrizine or loratadine) on subsequent days. -We discussed that her left upper abdominal left lower chest wall pain is likely due to muscle strain and has worsened due to some inflammation.  She has not been taking any pain medications and we discussed that she could use over-the-counter lidocaine patch and as needed over-the-counter NSAIDs. -We  discussed and decided to get a CTA of the chest to rule out PE and other lung pathology or pneumothorax and a CT of the abdomen to rule out splenic infarction or other splenic pathology to rule out any more concerning findings causing her pain.  No associated shortness of breath.  The rest of her abdominal examination was benign.  6) left ovarian cyst - 4.2 cm . 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 CA-125 levels were 28.6 on the normal range of 0-35 . Alpha-fetoprotein was within normal limits and 1.8 . CEA level was 1.3 . -This was noted again on CT of abdomen and pelvis  -would recommend GYN evaluation through her primary care physician. Patient notes that she already has one.  3) family history of breast cancer . Patient notes that her sister had breast cancer in her 68s . Has not had genetic testing . No other family history of breast cancer ovarian cancer pancreatic cancer or melanoma . She has had recent left breast cysts that were aspirated in [place] and per her report were noted to be benign . Plan - continue yearly mammograms with PCP  4) history of depression was previously on Lexapro currently not on any medications . Lost her husband 2009 to throat cancer .  CT chest/abd/pelvis today F/u as per scheduled clinic visit on 03/22/2017 with labs   All of the patients questions were answered with apparent satisfaction. The patient knows to call the clinic with any problems, questions or concerns.  I spent 20  minutes counseling the patient face to face. The total time spent in the appointment was 25 minutes and more than 50% was on counseling and direct patient cares.    .Patient was Personally and independently interviewed, examined and relevant elements of the history of present illness were reviewed in details and an assessment and plan was created. All elements of the patient's history of present illness , assessment and plan were discussed in details with **. The above  documentation reflects our combined findings assessment and plan.  Sullivan Lone MD Joanne  .I have reviewed the above documentation for accuracy and completeness, and I agree with the above.  Sullivan Lone MD Aubrey AAHIVMS [facility] Hematology/Oncology Physician [place]  (Office):       [phone] (Work cell):  [phone] (Fax):           [phone]  This document serves as a record of services personally performed by Sullivan Lone, MD. It was created on her behalf by Alean Rinne, a trained medical scribe. The creation of this record is based on the scribe's personal observations and the provider's statements to them. This document has been checked  and approved by the attending provider.

## 2017-03-01 NOTE — Patient Instructions (Signed)
[facility] Discharge Instructions for Patients Receiving Chemotherapy  Today you received the following chemotherapy agents:  Rituxan.  To help prevent nausea and vomiting after your treatment, we encourage you to take your nausea medication as directed.   If you develop nausea and vomiting that is not controlled by your nausea medication, call the clinic.   BELOW ARE SYMPTOMS THAT SHOULD BE REPORTED IMMEDIATELY:  *FEVER GREATER THAN 100.5 F  *CHILLS WITH OR WITHOUT FEVER  NAUSEA AND VOMITING THAT IS NOT CONTROLLED WITH YOUR NAUSEA MEDICATION  *UNUSUAL SHORTNESS OF BREATH  *UNUSUAL BRUISING OR BLEEDING  TENDERNESS IN MOUTH AND THROAT WITH OR WITHOUT PRESENCE OF ULCERS  *URINARY PROBLEMS  *BOWEL PROBLEMS  UNUSUAL RASH Items with * indicate a potential emergency and should be followed up as soon as possible.  Feel free to call the clinic should you have any questions or concerns. The clinic phone number is [phone].  Please show the CHEMO ALERT CARD at check-in to the Emergency Department and triage nurse.   

## 2017-03-14 DIAGNOSIS — C8580 Other specified types of non-Hodgkin lymphoma, unspecified site: Secondary | ICD-10-CM | POA: Diagnosis not present

## 2017-03-22 ENCOUNTER — Telehealth: Payer: Self-pay | Admitting: Hematology

## 2017-03-22 ENCOUNTER — Ambulatory Visit (HOSPITAL_BASED_OUTPATIENT_CLINIC_OR_DEPARTMENT_OTHER): Payer: Medicare HMO | Admitting: Hematology

## 2017-03-22 ENCOUNTER — Other Ambulatory Visit (HOSPITAL_BASED_OUTPATIENT_CLINIC_OR_DEPARTMENT_OTHER): Payer: Medicare HMO

## 2017-03-22 ENCOUNTER — Encounter: Payer: Self-pay | Admitting: Hematology

## 2017-03-22 VITALS — BP 137/54 | HR 68 | Temp 98.1°F | Resp 18 | Ht 62.0 in | Wt 123.8 lb

## 2017-03-22 DIAGNOSIS — C8307 Small cell B-cell lymphoma, spleen: Secondary | ICD-10-CM

## 2017-03-22 DIAGNOSIS — N83202 Unspecified ovarian cyst, left side: Secondary | ICD-10-CM

## 2017-03-22 DIAGNOSIS — D696 Thrombocytopenia, unspecified: Secondary | ICD-10-CM | POA: Diagnosis not present

## 2017-03-22 DIAGNOSIS — C8587 Other specified types of non-Hodgkin lymphoma, spleen: Secondary | ICD-10-CM | POA: Diagnosis not present

## 2017-03-22 DIAGNOSIS — D6959 Other secondary thrombocytopenia: Secondary | ICD-10-CM

## 2017-03-22 DIAGNOSIS — Z803 Family history of malignant neoplasm of breast: Secondary | ICD-10-CM | POA: Diagnosis not present

## 2017-03-22 DIAGNOSIS — L271 Localized skin eruption due to drugs and medicaments taken internally: Secondary | ICD-10-CM

## 2017-03-22 LAB — COMPREHENSIVE METABOLIC PANEL
ALT: 11 U/L (ref 0–55)
ANION GAP: 10 meq/L (ref 3–11)
AST: 17 U/L (ref 5–34)
Albumin: 4.3 g/dL (ref 3.5–5.0)
Alkaline Phosphatase: 89 U/L (ref 40–150)
BILIRUBIN TOTAL: 0.89 mg/dL (ref 0.20–1.20)
BUN: 16.3 mg/dL (ref 7.0–26.0)
CHLORIDE: 106 meq/L (ref 98–109)
CO2: 26 mEq/L (ref 22–29)
CREATININE: 0.8 mg/dL (ref 0.6–1.1)
Calcium: 9.4 mg/dL (ref 8.4–10.4)
EGFR: >60 mL/min/{1.73_m2} (ref >60)
Glucose: 56 mg/dl — ABNORMAL LOW (ref 70–140)
Potassium: 3.8 mEq/L (ref 3.5–5.1)
Sodium: 141 mEq/L (ref 136–145)
TOTAL PROTEIN: 6.8 g/dL (ref 6.4–8.3)

## 2017-03-22 LAB — LACTATE DEHYDROGENASE: LDH: 205 U/L (ref 125–245)

## 2017-03-22 LAB — CBC & DIFF AND RETIC
BASO%: 0.6 % (ref 0.0–2.0)
Basophils Absolute: 0 10*3/uL (ref 0.0–0.1)
EOS%: 2.3 % (ref 0.0–7.0)
Eosinophils Absolute: 0.1 10*3/uL (ref 0.0–0.5)
HEMATOCRIT: 41.9 % (ref 34.8–46.6)
HGB: 14 g/dL (ref 11.6–15.9)
Immature Retic Fract: 1.9 % (ref 1.60–10.00)
LYMPH%: 27.8 % (ref 14.0–49.7)
MCH: 31.2 pg (ref 25.1–34.0)
MCHC: 33.4 g/dL (ref 31.5–36.0)
MCV: 93.3 fL (ref 79.5–101.0)
MONO#: 0.3 10*3/uL (ref 0.1–0.9)
MONO%: 6.3 % (ref 0.0–14.0)
NEUT#: 3.3 10*3/uL (ref 1.5–6.5)
NEUT%: 63 % (ref 38.4–76.8)
PLATELETS: 136 10*3/uL — AB (ref 145–400)
RBC: 4.49 10*6/uL (ref 3.70–5.45)
RDW: 15 % — ABNORMAL HIGH (ref 11.2–14.5)
RETIC CT ABS: 78.58 10*3/uL (ref 33.70–90.70)
Retic %: 1.75 % (ref 0.70–2.10)
WBC: 5.2 10*3/uL (ref 3.9–10.3)
lymph#: 1.5 10*3/uL (ref 0.9–3.3)
nRBC: 0 % (ref 0–0)

## 2017-03-22 NOTE — Telephone Encounter (Signed)
Scheduled appt per 12/7 los - Gave patient AVS and calender per los.  

## 2017-03-26 NOTE — Progress Notes (Signed)
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 female who has been self referred to [place] for evaluation and management of newly noted splenomegaly.  Patient does not have many chronic medical comorbidities. 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 She also visited [place], [place], [place] and a few other places last 4 years.  A couple months ago while still in [place] she noticed some fullness in the upper and left upper abdomen and had an ultrasound which showed splenomegaly with the spleen length of 16.6 cm with no overt focal splenic lesions. No overt abdominal adenopathy noted. No free fluid. Liver, gallbladder, pancreas, both kidneys loss of bowel were noted to be within normal limits. There was incidental finding of a 4.2 cm thin-walled unilocular left ovarian cyst with no solid components. Normal uterus and right ovary was seen.  Her blood counts done on 09/02/2015 showed normal hemoglobin of 14 with an MCV of 91, WBC count of 5.9k with normal differential. Platelet clumping was noted with reported count of 103k. Malaria parasite smear was done and no other parasites were noted.  INTERVAL HISTORY  Ms Spatz is here for follow-up of her Splenic marginal zone lymphoma. She notes left lower chest/LUQ abd discomfort and had a CTA chest which was neg for PE and CT abd - showed only borderline enlarged spleen with no splenic infarction. No CP/SOB/pruiritus. No other prohibitive toxicities from Rituxan. Thrombocytopenia has resolved. She notes that she plans to travel to [place] in Dec 2018.  On review of  systems,pt reports decreased taste, fatigue, night sweats, aches to her legs, weight loss, chills,mild abdominal pain, and denies fever, abdominal pain and any other accompanying symptoms.   MEDICAL HISTORY:   #1 Dyslipidemia - not on any medications #2 GERD #3 hot flashes menopausal status was previously on [place] patch #4 history of sebaceous cysts. #5 history of recent left breast cysts - aspirated by ultrasound guidance and noted to be negative for cancer. #6 family history of breast cancer in her sister when she was in her 28s. No genetic testing done at that time.  SURGICAL HISTORY:  #1 breast cysts 2 aspiration on the left side . #2 root canal treatments #3 sebaceous cyst excision   SOCIAL HISTORY: Social History   Socioeconomic History  . Marital status: Widowed    Spouse name: Not on file  . Number of children: Not on file  . Years of education: Not on file  . Highest education level: Not on file  Social Needs  . Financial resource strain: Not on file  . Food insecurity - worry: Not on file  . Food insecurity - inability: Not on file  . Transportation needs - medical: Not on file  . Transportation needs - non-medical: Not on file  Occupational History  . Not on file  Tobacco Use  . Smoking status: Never Smoker  . Smokeless tobacco: Never Used  Substance and Sexual Activity  . Alcohol use: No  . Drug use: No  . Sexual activity: Not on file  Other Topics Concern  . Not on file  Social History Narrative  . Not on file    FAMILY HISTORY: History reviewed. No pertinent family history.  ALLERGIES:  is allergic to codeine.  MEDICATIONS:  Current Outpatient Medications  Medication Sig Dispense Refill  . aspirin 81 MG EC tablet Take 81 mg by mouth daily. Swallow whole.    . Misc Natural Products (TART CHERRY ADVANCED PO) Take by mouth.    . Omega-3 Fatty Acids (FISH OIL) 1200 MG CAPS Take by mouth.    . prochlorperazine (COMPAZINE) 10 MG tablet Take 1  tablet (10 mg total) by mouth every 6 (six) hours as needed for nausea or vomiting. 50 tablet 0   No current facility-administered medications for this visit.     REVIEW OF SYSTEMS:    10 Point review of Systems was done is negative except as noted above.  PHYSICAL EXAMINATION: ECOG PERFORMANCE STATUS: 0 - Asymptomatic VS reviewed and are stable.  GENERAL:alert, in no acute distress and comfortable SKIN: skin color, texture, turgor are normal, no rashes or significant lesions EYES: normal, conjunctiva are pink and non-injected, sclera clear OROPHARYNX:no exudate, no erythema and lips, buccal mucosa, and tongue normal  NECK: supple, no JVD, thyroid normal size, non-tender, without nodularity LYMPH:  no palpable lymphadenopathy in the cervical, axillary or inguinal LUNGS: clear to auscultation with normal respiratory effort. TTP over with left anterior lower chest wall and left upper abdomen. HEART: regular rate & rhythm,  no murmurs and no lower extremity edema,  ABDOMEN: abdomen soft, non-tender, normoactive bowel sounds , No palpable hepatomegaly . No palpable splenomegaly Musculoskeletal: no cyanosis of digits and no clubbing  PSYCH: alert & oriented x 3 with fluent speech NEURO: no focal motor/sensory deficits  LABORATORY DATA:  I have reviewed the data as listed  . CBC Latest Ref Rng & Units 03/22/2017 03/01/2017 02/22/2017  WBC 3.9 - 10.3 10e3/uL 5.2 7.0 6.6  Hemoglobin 11.6 - 15.9 g/dL 14.0 14.0 13.7  Hematocrit 34.8 - 46.6 % 41.9 42.4 41.0  Platelets 145 - 400 10e3/uL 136(L) 142(L) 155   . CBC    Component Value Date/Time   WBC 5.2 03/22/2017 0950   RBC 4.49 03/22/2017 0950   HGB 14.0 03/22/2017 0950   HCT 41.9 03/22/2017 0950   PLT 136 (L) 03/22/2017 0950   MCV 93.3 03/22/2017 0950   MCH 31.2 03/22/2017 0950   MCHC 33.4 03/22/2017 0950   RDW 15.0 (H) 03/22/2017 0950   LYMPHSABS 1.5 03/22/2017 0950   MONOABS 0.3 03/22/2017 0950   EOSABS 0.1 03/22/2017 0950    BASOSABS 0.0 03/22/2017 0950    . CMP Latest Ref Rng & Units 03/22/2017 03/01/2017 02/22/2017  Glucose 70 - 140 mg/dl 56(L) 65(L) 68(L)  BUN 7.0 - 26.0 mg/dL 16.3 13.1 11.6  Creatinine 0.6 - 1.1 mg/dL 0.8 0.8 0.7  Sodium 136 - 145 mEq/L 141 137 139  Potassium 3.5 - 5.1 mEq/L 3.8 3.8 3.7  CO2 22 - 29 mEq/L '26 24 23  ' Calcium 8.4 - 10.4 mg/dL 9.4 9.3 9.1  Total Protein 6.4 - 8.3 g/dL 6.8 6.7 6.4  Total Bilirubin 0.20 - 1.20 mg/dL 0.89 0.82 0.70  Alkaline Phos 40 - 150 U/L 89 84 76  AST 5 - 34 U/L '17 14 13  ' ALT 0 - 55 U/L '11 11 10   ' Component     Latest Ref Rng & Units 02/11/2017  LDH     125 - 245 U/L 233  TSH     0.308 - 3.960 m(IU)/L 2.186  Hematopathology Order10/03/2017 [place]  BONE MARROW BIOPSY 01/25/2017: Interpretation Fourteen of the twenty metaphase cells analyzed are normal (46,XX). Six cells are abnormal and contain a deletion within the long arm of chromosome 7 and additional material of unknown origin attached to the long arm of chromosome 14 at or near the [facility] locus. Deletion of 7q is seen in mature B-cell neoplasms including marginal zone lymphoma. Ten cells were analyzed from the 24 hour unstimulated culture and ten cells were analyzed from the 72 hour culture stimulated with pokeweed mitogen, phorbol 12-myristate 13-acetate (PMA), and a CpG-oligodeoxynucleotide (ODN). The abnormal clone was only seen in the 72 hour stimulated culture.   Component Name Value Ref Range  Case Report Surgical Pathology Report Case: [id number]  Authorizing Provider:Rebecca Karie Kirks Sawchak,Collected: 01/25/2017 1100  AGNP  [facility]ing [facility]IONReceived:01/25/2017 [place] Pathologist: Trudee Kuster,  MD  Specimens: A) - Bone Marrow Right - Aspirate  B) - Bone Marrow Right - Biopsy  C) - Peripheral Blood   Final Diagnosis Bone marrow, right iliac, aspiration and biopsy -  Hypercellular bone marrow (70%) with involvement by mature B-cell lymphoma with non-specific immunophenotype (representing approximately 20% of marrow cellularity by immunohistochemical analysis) (See Comment)  -  Cytogenetic studies are pending.     RADIOGRAPHIC STUDIES: I have personally reviewed the radiological images as listed and agreed with the findings in the report. Ct Angio Chest Pe W Or Wo Contrast  Result Date: 03/01/2017 CLINICAL DATA:  Left lower chest pain, evaluate for PE. History of splenic lymphoma with chemotherapy ongoing, evaluate for splenic injury/infarct. EXAM: CT ANGIOGRAPHY CHEST CT ABDOMEN WITH CONTRAST TECHNIQUE: Multidetector CT imaging of the chest was performed using the standard protocol during bolus administration of intravenous contrast. Multiplanar CT image reconstructions and MIPs were obtained to evaluate the vascular anatomy. Multidetector CT imaging of the abdomen was performed using the standard protocol during bolus administration of intravenous contrast. CONTRAST:  151m ISOVUE-370 IOPAMIDOL (ISOVUE-370) INJECTION 76% COMPARISON:  CT abdomen/ pelvis dated 11/30/2016 FINDINGS: CTA CHEST FINDINGS Cardiovascular: Satisfactory opacification the bilateral pulmonary artery to the segmental level. No evidence of pulmonary embolism. No evidence  of thoracic aortic aneurysm or dissection. The heart is normal in size.  No pericardial effusion. Mediastinum/Nodes: No suspicious mediastinal lymphadenopathy. Visualized thyroid is unremarkable. Lungs/Pleura: Lungs are essentially clear. Very mild dependent atelectasis in the bilateral upper and lower lobes. No focal consolidation. No pleural effusion or pneumothorax. Musculoskeletal: Visualized osseous structures are within normal limits. Review of the MIP images confirms the above findings. CT ABDOMEN FINDINGS Hepatobiliary: Small probable hepatic cysts measuring up to 9 mm in segment 6 (series 7/image 20). Gallbladder is underdistended but unremarkable. No intrahepatic or extrahepatic ductal dilatation. Pancreas: Within normal limits. Spleen: At the upper limits of normal for size. No evidence of splenic infarct. Adrenals/Urinary Tract: Adrenal glands are within normal limits. Tiny left renal cysts measuring up to 4 mm in the left lower pole (series 7/ image 33). Right kidney is within normal limits. No hydronephrosis. Stomach/Bowel: Stomach is within normal limits. Visualized bowel is grossly unremarkable. Vascular/Lymphatic: No evidence of abdominal aortic aneurysm. No suspicious abdominal lymphadenopathy. Other: No abdominal ascites. Musculoskeletal: Mild degenerative changes at L2-3. Review of the MIP images confirms the above findings. IMPRESSION: No evidence of pulmonary embolism. No evidence of acute cardiopulmonary disease. No evidence of splenic infarct. Unremarkable CT abdomen. Please note that the pelvis was not imaged. Electronically Signed   By: SHenderson NewcomerD.  On: 03/01/2017 15:56   Ct Abdomen W Contrast  Result Date: 03/01/2017 CLINICAL DATA:  Left lower chest pain, evaluate for PE. History of splenic lymphoma with chemotherapy ongoing, evaluate for splenic injury/infarct. EXAM: CT ANGIOGRAPHY CHEST CT ABDOMEN WITH CONTRAST TECHNIQUE: Multidetector CT imaging of the chest was  performed using the standard protocol during bolus administration of intravenous contrast. Multiplanar CT image reconstructions and MIPs were obtained to evaluate the vascular anatomy. Multidetector CT imaging of the abdomen was performed using the standard protocol during bolus administration of intravenous contrast. CONTRAST:  138m ISOVUE-370 IOPAMIDOL (ISOVUE-370) INJECTION 76% COMPARISON:  CT abdomen/ pelvis dated 11/30/2016 FINDINGS: CTA CHEST FINDINGS Cardiovascular: Satisfactory opacification the bilateral pulmonary artery to the segmental level. No evidence of pulmonary embolism. No evidence of thoracic aortic aneurysm or dissection. The heart is normal in size.  No pericardial effusion. Mediastinum/Nodes: No suspicious mediastinal lymphadenopathy. Visualized thyroid is unremarkable. Lungs/Pleura: Lungs are essentially clear. Very mild dependent atelectasis in the bilateral upper and lower lobes. No focal consolidation. No pleural effusion or pneumothorax. Musculoskeletal: Visualized osseous structures are within normal limits. Review of the MIP images confirms the above findings. CT ABDOMEN FINDINGS Hepatobiliary: Small probable hepatic cysts measuring up to 9 mm in segment 6 (series 7/image 20). Gallbladder is underdistended but unremarkable. No intrahepatic or extrahepatic ductal dilatation. Pancreas: Within normal limits. Spleen: At the upper limits of normal for size. No evidence of splenic infarct. Adrenals/Urinary Tract: Adrenal glands are within normal limits. Tiny left renal cysts measuring up to 4 mm in the left lower pole (series 7/ image 33). Right kidney is within normal limits. No hydronephrosis. Stomach/Bowel: Stomach is within normal limits. Visualized bowel is grossly unremarkable. Vascular/Lymphatic: No evidence of abdominal aortic aneurysm. No suspicious abdominal lymphadenopathy. Other: No abdominal ascites. Musculoskeletal: Mild degenerative changes at L2-3. Review of the MIP images  confirms the above findings. IMPRESSION: No evidence of pulmonary embolism. No evidence of acute cardiopulmonary disease. No evidence of splenic infarct. Unremarkable CT abdomen. Please note that the pelvis was not imaged. Electronically Signed   By: SJulian HyM.D.   On: 03/01/2017 15:56    ASSESSMENT & PLAN:   66year old female with   1) Moderate  splenomegaly at 16.6 cm based [place] shows improvement in splenomegaly to 14 cm. Related to SMZL -- now resolved.  2) Newly diagnosed splenic marginal zone lymphoma. Based on peripheral blood flow cytometry. With constitutional sypmtoms fatigue and night sweats - now resolved with Rituxan treatment. No anemia. And mild thrombocytopenia with platelets of 102k now nearly resolved at 136k HIV, hepatitis C and hepatitis B serologies unrevealing  SPEP shows no M spike and negative IFE.  [facility]  Hypercellular bone marrow (70%) with involvement by mature B-cell lymphoma with non-specific immunophenotype (representing approximately 20% of marrow cellularity by immunohistochemical analysis) Cytogenetics 7q deletion  3) Thrombocytopenia -  Likely due to splenomegaly with hypersplenism due to SMZL. Now resolved with platelet counts improved to 136k  4) Rituxan related Rash - grade 1. Resolved and did not recur with dose 2 and 3 with adjusted premedications.  5) Worsening Left anterior lower chest wall and left upper abdomen discomfort -- likely muscle strain with persistent discomfort. CT chest/abd showed no PE. No splenic infarction or other pathology. PLAN -patient is here for f/u of her SMZL and if noted to have resolution of her fatigue and night sweats and feels well.  Her LUQ pain is  resolved. Imaging with CT chest/abd with no overt pathology. Nearly resolved splenomegaly. -no indication for additional treatment of the patient SMZL at this time.  6) left ovarian cyst - 4.2  cm . 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 CA-125 levels were 28.6 on the normal range of 0-35 . Alpha-fetoprotein was within normal limits and 1.8 . CEA level was 1.3 . -This was noted again on CT of abdomen and pelvis  -would recommend GYN evaluation through her primary care physician. Patient notes that she already has one.  3) family history of breast cancer . Patient notes that her sister had breast cancer in her 47s . Has not had genetic testing . No other family history of breast cancer ovarian cancer pancreatic cancer or melanoma . She has had recent left breast cysts that were aspirated in [place] and per her report were noted to be benign . Plan - continue yearly mammograms with PCP  4) history of depression was previously on Lexapro currently not on any medications . Lost her husband 2009 to throat cancer .  RTC with Dr Irene Limbo with labs in 3 months  All of the patients questions were answered with apparent satisfaction. The patient knows to call the clinic with any problems, questions or concerns.  I spent 20  minutes counseling the patient face to face. The total time spent in the appointment was 25 minutes and more than 50% was on counseling and direct patient cares.  Sullivan Lone MD [place] AAHIVMS [facility] Hematology/Oncology [facility]  (Office):       [phone] (Work cell):  [phone] (Fax):           [phone]

## 2017-04-01 DIAGNOSIS — R52 Pain, unspecified: Secondary | ICD-10-CM | POA: Diagnosis not present

## 2017-04-01 DIAGNOSIS — J111 Influenza due to unidentified influenza virus with other respiratory manifestations: Secondary | ICD-10-CM | POA: Diagnosis not present

## 2017-04-01 DIAGNOSIS — R05 Cough: Secondary | ICD-10-CM | POA: Diagnosis not present

## 2017-04-02 ENCOUNTER — Ambulatory Visit
Admission: RE | Admit: 2017-04-02 | Discharge: 2017-04-02 | Disposition: A | Discharge status: Home / Self Care | Payer: Medicare HMO | Source: Ambulatory Visit | Attending: Family Medicine | Admitting: Family Medicine

## 2017-04-02 ENCOUNTER — Other Ambulatory Visit: Payer: Self-pay | Admitting: Family Medicine

## 2017-04-02 DIAGNOSIS — R05 Cough: Secondary | ICD-10-CM | POA: Diagnosis not present

## 2017-04-02 DIAGNOSIS — J111 Influenza due to unidentified influenza virus with other respiratory manifestations: Secondary | ICD-10-CM

## 2017-04-02 DIAGNOSIS — R059 Cough, unspecified: Secondary | ICD-10-CM

## 2017-04-02 DIAGNOSIS — C8590 Non-Hodgkin lymphoma, unspecified, unspecified site: Secondary | ICD-10-CM

## 2017-05-29 ENCOUNTER — Telehealth: Payer: Self-pay

## 2017-05-29 NOTE — Telephone Encounter (Signed)
Per 2/13 phone message, In was Unable to assist patient due to no specifics in her message that was left.

## 2017-05-30 ENCOUNTER — Telehealth: Payer: Self-pay | Admitting: Hematology

## 2017-05-30 NOTE — Telephone Encounter (Signed)
Patient called in to schedule she is having concerns with symtoms

## 2017-06-06 NOTE — Progress Notes (Signed)
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 female who has been self referred to [place] for evaluation and management of newly noted splenomegaly.  Patient does not have many chronic medical comorbidities. 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 She also visited [place], [place], [place] and a few other places last 4 years.  A couple months ago while still in [place] she noticed some fullness in the upper and left upper abdomen and had an ultrasound which showed splenomegaly with the spleen length of 16.6 cm with no overt focal splenic lesions. No overt abdominal adenopathy noted. No free fluid. Liver, gallbladder, pancreas, both kidneys loss of bowel were noted to be within normal limits. There was incidental finding of a 4.2 cm thin-walled unilocular left ovarian cyst with no solid components. Normal uterus and right ovary was seen.  Her blood counts done on 09/02/2015 showed normal hemoglobin of 14 with an MCV of 91, WBC count of 5.9k with normal differential. Platelet clumping was noted with reported count of 103k. Malaria parasite smear was done and no other parasites were noted.  INTERVAL HISTORY:  Ms Kramme is here for follow-up of her Splenic marginal zone lymphoma. The patient's last visit with [place] was on 03/22/17. The pt reports that she is doing well overall and that she thoroughly enjoyed her trip to [place] for two weeks over the holidays. She reports that she feels as though she has strained a muscle on her left flank, which concerned her due to the proximity of her pain to her spleen. She reports that after her travels, her father  unexpectedly passed away on 2022/05/19, and that following this and her travels she felt quite weak; this also concerned her regarding her history of SMZL. She notes feeling good presently, enjoying yoga, travelling, and being active with her friends. She reports emotionally doing pretty well all things considered.   Of note since the patient last visit, pt has had a CXR completed on 04/02/17 with results revealing Normal chest radiographs.   Lab results today (06/10/17) of CBC with Differentials, CMP and Reticulocytes is as follows: all values are WNL.  LDH today 2/25 is 180  On review of systems, pt reports left flank pain, some chills, dry cough, and denies abdominal pains, fevers, and any other symptoms.   MEDICAL HISTORY:   #1 Dyslipidemia - not on any medications #2 GERD #3 hot flashes menopausal status was previously on [place]imara patch #4 history of sebaceous cysts. #5 history of recent left breast cysts - aspirated by ultrasound guidance and noted to be negative for cancer. #6 family history of breast cancer in her sister when she was in her 49s. No genetic testing done at that time.  SURGICAL HISTORY:  #1 breast cysts 2 aspiration on the left side . #2 root canal treatments #3 sebaceous cyst excision   SOCIAL HISTORY: Social History   Socioeconomic History  . Marital status: Widowed    Spouse name: Not on file  . Number of children: Not on file  . Years of education: Not on file  . Highest education level: Not  on file  Social Needs  . Financial resource strain: Not on file  . Food insecurity - worry: Not on file  . Food insecurity - inability: Not on file  . Transportation needs - medical: Not on file  . Transportation needs - non-medical: Not on file  Occupational History  . Not on file  Tobacco Use  . Smoking status: Never Smoker  . Smokeless tobacco: Never Used  Substance and Sexual Activity  . Alcohol use: No  . Drug use: No  . Sexual activity: Not on file    Other Topics Concern  . Not on file  Social History Narrative  . Not on file    FAMILY HISTORY: History reviewed. No pertinent family history.  ALLERGIES:  is allergic to codeine.  MEDICATIONS:  Current Outpatient Medications  Medication Sig Dispense Refill  . aspirin 81 MG EC tablet Take 81 mg by mouth daily. Swallow whole.    . Misc Natural Products (TART CHERRY ADVANCED PO) Take by mouth.    . Omega-3 Fatty Acids (FISH OIL) 1200 MG CAPS Take by mouth.    . prochlorperazine (COMPAZINE) 10 MG tablet Take 1 tablet (10 mg total) by mouth every 6 (six) hours as needed for nausea or vomiting. 50 tablet 0   No current facility-administered medications for this visit.     REVIEW OF SYSTEMS:    .10 Point review of Systems was done is negative except as noted above.   PHYSICAL EXAMINATION: ECOG PERFORMANCE STATUS: 0 - Asymptomatic  . GENERAL:alert, in no acute distress and comfortable SKIN: no acute rashes, no significant lesions EYES: conjunctiva are pink and non-injected, sclera anicteric OROPHARYNX: MMM, no exudates, no oropharyngeal erythema or ulceration NECK: supple, no JVD LYMPH:  no palpable lymphadenopathy in the cervical, axillary or inguinal regions LUNGS: clear to auscultation b/l with normal respiratory effort HEART: regular rate & rhythm ABDOMEN:  normoactive bowel sounds , non tender, not distended. Extremity: no pedal edema PSYCH: alert & oriented x 3 with fluent speech NEURO: no focal motor/sensory deficits    LABORATORY DATA:  I have reviewed the data as listed  . CBC Latest Ref Rng & Units 06/10/2017 03/22/2017 03/01/2017  WBC 3.9 - 10.3 K/uL 5.1 5.2 7.0  Hemoglobin 11.6 - 15.9 g/dL - 14.0 14.0  Hematocrit 34.8 - 46.6 % 43.7 41.9 42.4  Platelets 145 - 400 K/uL 163 136(L) 142(L)   . CBC    Component Value Date/Time   WBC 5.1 06/10/2017 0852   WBC 5.2 03/22/2017 0950   RBC 4.61 06/10/2017 0852   RBC 4.61 06/10/2017 0852   HGB 14.0 03/22/2017  0950   HCT 43.7 06/10/2017 0852   HCT 41.9 03/22/2017 0950   PLT 163 06/10/2017 0852   PLT 136 (L) 03/22/2017 0950   MCV 94.8 06/10/2017 0852   MCV 93.3 03/22/2017 0950   MCH 31.9 06/10/2017 0852   MCHC 33.6 06/10/2017 0852   RDW 13.7 06/10/2017 0852   RDW 15.0 (H) 03/22/2017 0950   LYMPHSABS 1.5 06/10/2017 0852   LYMPHSABS 1.5 03/22/2017 0950   MONOABS 0.3 06/10/2017 0852   MONOABS 0.3 03/22/2017 0950   EOSABS 0.1 06/10/2017 0852   EOSABS 0.1 03/22/2017 0950   BASOSABS 0.0 06/10/2017 0852   BASOSABS 0.0 03/22/2017 0950    . CMP Latest Ref Rng & Units 06/10/2017 03/22/2017 03/01/2017  Glucose 70 - 140 mg/dL 92 56(L) 65(L)  BUN 7 - 26 mg/dL 14 16.3 13.1  Creatinine 0.60 - 1.10 mg/dL 0.80 0.8  0.8  Sodium 136 - 145 mmol/L 141 141 137  Potassium 3.5 - 5.1 mmol/L 4.5 3.8 3.8  Chloride 98 - 109 mmol/L 106 - -  CO2 22 - 29 mmol/L _0 Calcium 8.4 - 10.4 mg/dL 10.1 9.4 9.3  Total Protein 6.4 - 8.3 g/dL 6.9 6.8 6.7  Total Bilirubin 0.2 - 1.2 mg/dL 0.7 0.89 0.82  Alkaline Phos 40 - 150 U/L 91 89 84  AST 5 - 34 U/L _1 ALT 0 - 55 U/L _2 . Lab Results  Component Value Date   LDH 180 06/10/2017        Hematopathology Order10/03/2017 [place]  BONE MARROW BIOPSY 01/25/2017: Interpretation Fourteen of the twenty metaphase cells analyzed are normal (46,XX). Six cells are abnormal and contain a deletion within the long arm of chromosome 7 and additional material of unknown origin attached to the long arm of chromosome 14 at or near the [facility] locus. Deletion of 7q is seen in mature B-cell neoplasms including marginal zone lymphoma. Ten cells were analyzed from the 24 hour unstimulated culture and ten cells were analyzed from the 72 hour culture stimulated with pokeweed mitogen, phorbol 12-myristate 13-acetate (PMA), and a CpG-oligodeoxynucleotide (ODN). The abnormal clone was only seen in the 72 hour stimulated culture.   Component Name Value Ref Range  Case  Report Surgical Pathology Report Case: [id number]  Authorizing Provider:Rebecca Karie Kirks Sawchak,Collected: 01/25/2017 1100  AGNP  [facility]ing [facility]IONReceived:01/25/2017 [place] Pathologist: Trudee Kuster,  MD  Specimens: A) - Bone Marrow Right - Aspirate  B) - Bone Marrow Right - Biopsy  C) - Peripheral Blood   Final Diagnosis Bone marrow, right iliac, aspiration and biopsy -  Hypercellular bone marrow (70%) with involvement by mature B-cell lymphoma with non-specific immunophenotype (representing approximately 20% of marrow cellularity by immunohistochemical analysis) (See Comment)  -  Cytogenetic studies are pending.     RADIOGRAPHIC STUDIES: I have personally reviewed the radiological images as listed and agreed with the findings in the report. No results found.  ASSESSMENT & PLAN:   67 year old female with   1) Splenic marginal zone lymphoma. Presented with splenomegaly and cytopenia with constitutional sypmtoms fatigue and night sweats - now resolved with Rituxan treatment. HIV, hepatitis C and hepatitis B serologies unrevealing  SPEP shows no M spike and negative IFE.  BM Bx done at [facility] -  Hypercellular bone marrow (70%) with involvement by mature B-cell lymphoma with non-specific immunophenotype  (representing approximately 20% of marrow cellularity by immunohistochemical analysis) Cytogenetics 7q deletion  Plan: -Discussed pt labwork today; Platelets at 163k, Hgb at 14.7, WBC at 5.1k, Lymph Abs at 1.5k, all WNL.  -Left flank plain seems to be musculoskeletal- resolving. No palpable splenomegaly.  -Discussed wait and watch approach vs maintenance Rituxan with the pt. -Pt notes no bothersome symptoms and her blood counts and chemistries are all normal at this time; wait and watch approach is best pursued at this time.    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 Now resolved with platelet counts improved to  163k  4) Rituxan related Rash - grade 1. Resolved and did not recur with dose 2 and 3 and 4 with adjusted premedications.  5) Left anterior lower chest wall and left upper abdomen discomfort -- likely muscle strain with persistent discomfort. CT chest/abd showed no PE. No splenic infarction or other pathology. PLAN -no indication for  additional treatment of the patient SMZL at this time. -warm compresses. -avoid heavy lifting (has been lifting her grand child)  6) left ovarian cyst - 4.2 cm . 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 CA-125 levels were 28.6 on the normal range of 0-35 . Alpha-fetoprotein was within normal limits and 1.8 . CEA level was 1.3 . -This was noted again on CT of abdomen and pelvis  -would recommend GYN evaluation through her primary care physician. Patient notes that she already has one.  7) family history of breast cancer . Patient notes that her sister had breast cancer in her 19s . Has not had genetic testing . No other family history of breast cancer ovarian cancer pancreatic cancer or melanoma . She has had recent left breast cysts that were aspirated in [place] and per her report were noted to be benign . Plan -yrly MMG with PCP  8) history of depression was previously on Lexapro currently not on any  medications . Lost her husband 2009 to throat cancer .  RTC with Dr Irene Limbo in 3 months with labs  All of the patients questions were answered with apparent satisfaction. The patient knows to call the clinic with any problems, questions or concerns.  . The total time spent in the appointment was 25 minutes and more than 50% was on counseling and direct patient cares.     Sullivan Lone MD [place] AAHIVMS [facility] Hematology/Oncology Physician Teller  (Office):       [phone] (Work cell):  [phone] (Fax):           [phone]  This document serves as a record of services personally performed by Sullivan Lone, MD. It was created on his behalf by Baldwin Jamaica, a trained medical scribe. The creation of this record is based on the scribe's personal observations and the provider's statements to them.   .I have reviewed the above documentation for accuracy and completeness, and I agree with the above. Brunetta Genera MD MS

## 2017-06-10 ENCOUNTER — Inpatient Hospital Stay: Payer: Medicare HMO | Attending: Hematology | Admitting: Hematology

## 2017-06-10 ENCOUNTER — Inpatient Hospital Stay: Payer: Medicare HMO

## 2017-06-10 ENCOUNTER — Encounter: Payer: Self-pay | Admitting: Hematology

## 2017-06-10 ENCOUNTER — Telehealth: Payer: Self-pay

## 2017-06-10 VITALS — BP 110/45 | HR 57 | Temp 99.2°F | Resp 18 | Ht 62.0 in | Wt 127.8 lb

## 2017-06-10 DIAGNOSIS — D696 Thrombocytopenia, unspecified: Secondary | ICD-10-CM | POA: Diagnosis not present

## 2017-06-10 DIAGNOSIS — C8307 Small cell B-cell lymphoma, spleen: Secondary | ICD-10-CM | POA: Diagnosis not present

## 2017-06-10 DIAGNOSIS — Z7982 Long term (current) use of aspirin: Secondary | ICD-10-CM | POA: Diagnosis not present

## 2017-06-10 DIAGNOSIS — R161 Splenomegaly, not elsewhere classified: Secondary | ICD-10-CM | POA: Insufficient documentation

## 2017-06-10 DIAGNOSIS — Z79899 Other long term (current) drug therapy: Secondary | ICD-10-CM | POA: Diagnosis not present

## 2017-06-10 DIAGNOSIS — E785 Hyperlipidemia, unspecified: Secondary | ICD-10-CM | POA: Diagnosis not present

## 2017-06-10 DIAGNOSIS — N83202 Unspecified ovarian cyst, left side: Secondary | ICD-10-CM | POA: Insufficient documentation

## 2017-06-10 DIAGNOSIS — F329 Major depressive disorder, single episode, unspecified: Secondary | ICD-10-CM | POA: Diagnosis not present

## 2017-06-10 DIAGNOSIS — K219 Gastro-esophageal reflux disease without esophagitis: Secondary | ICD-10-CM | POA: Diagnosis not present

## 2017-06-10 LAB — COMPREHENSIVE METABOLIC PANEL
ALBUMIN: 4.2 g/dL (ref 3.5–5.0)
ALT: 15 U/L (ref 0–55)
AST: 19 U/L (ref 5–34)
Alkaline Phosphatase: 91 U/L (ref 40–150)
Anion gap: 10 (ref 3–11)
BILIRUBIN TOTAL: 0.7 mg/dL (ref 0.2–1.2)
BUN: 14 mg/dL (ref 7–26)
CO2: 25 mmol/L (ref 22–29)
Calcium: 10.1 mg/dL (ref 8.4–10.4)
Chloride: 106 mmol/L (ref 98–109)
Creatinine, Ser: 0.8 mg/dL (ref 0.60–1.10)
GFR calc Af Amer: >60 mL/min (ref >60)
GFR calc non Af Amer: >60 mL/min (ref >60)
GLUCOSE: 92 mg/dL (ref 70–140)
Potassium: 4.5 mmol/L (ref 3.5–5.1)
SODIUM: 141 mmol/L (ref 136–145)
TOTAL PROTEIN: 6.9 g/dL (ref 6.4–8.3)

## 2017-06-10 LAB — CBC WITH DIFFERENTIAL (CANCER CENTER ONLY)
Basophils Absolute: 0 10*3/uL (ref 0.0–0.1)
Basophils Relative: 0 %
Eosinophils Absolute: 0.1 10*3/uL (ref 0.0–0.5)
Eosinophils Relative: 3 %
HEMATOCRIT: 43.7 % (ref 34.8–46.6)
Hemoglobin: 14.7 g/dL (ref 11.6–15.9)
LYMPHS ABS: 1.5 10*3/uL (ref 0.9–3.3)
LYMPHS PCT: 29 %
MCH: 31.9 pg (ref 25.1–34.0)
MCHC: 33.6 g/dL (ref 31.5–36.0)
MCV: 94.8 fL (ref 79.5–101.0)
MONO ABS: 0.3 10*3/uL (ref 0.1–0.9)
MONOS PCT: 7 %
NEUTROS PCT: 61 %
Neutro Abs: 3.1 10*3/uL (ref 1.5–6.5)
Platelet Count: 163 10*3/uL (ref 145–400)
RBC: 4.61 MIL/uL (ref 3.70–5.45)
RDW: 13.7 % (ref 11.2–14.5)
WBC Count: 5.1 10*3/uL (ref 3.9–10.3)

## 2017-06-10 LAB — LACTATE DEHYDROGENASE: LDH: 180 U/L (ref 125–245)

## 2017-06-10 LAB — RETICULOCYTES
RBC.: 4.61 MIL/uL (ref 3.70–5.45)
Retic Count, Absolute: 55.3 10*3/uL (ref 33.7–90.7)
Retic Ct Pct: 1.2 % (ref 0.7–2.1)

## 2017-06-10 NOTE — Telephone Encounter (Signed)
Printed avs and calender of upcoming appointment. Per 2/25 los 

## 2017-06-21 ENCOUNTER — Other Ambulatory Visit: Payer: Medicare HMO

## 2017-06-21 ENCOUNTER — Ambulatory Visit: Payer: Medicare HMO | Admitting: Hematology

## 2017-08-23 DIAGNOSIS — N951 Menopausal and female climacteric states: Secondary | ICD-10-CM | POA: Diagnosis not present

## 2017-08-23 DIAGNOSIS — N83202 Unspecified ovarian cyst, left side: Secondary | ICD-10-CM | POA: Diagnosis not present

## 2017-09-10 ENCOUNTER — Other Ambulatory Visit: Payer: Medicare HMO

## 2017-09-10 ENCOUNTER — Ambulatory Visit: Payer: Medicare HMO | Admitting: Hematology

## 2017-09-16 NOTE — Progress Notes (Signed)
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 female who has been self referred to [place] for evaluation and management of newly noted splenomegaly.  Patient does not have many chronic medical comorbidities. 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 She also visited [place], [place], [place] and a few other places last 4 years.  A couple months ago while still in [place] she noticed some fullness in the upper and left upper abdomen and had an ultrasound which showed splenomegaly with the spleen length of 16.6 cm with no overt focal splenic lesions. No overt abdominal adenopathy noted. No free fluid. Liver, gallbladder, pancreas, both kidneys loss of bowel were noted to be within normal limits. There was incidental finding of a 4.2 cm thin-walled unilocular left ovarian cyst with no solid components. Normal uterus and right ovary was seen.  Her blood counts done on 09/02/2015 showed normal hemoglobin of 14 with an MCV of 91, WBC count of 5.9k with normal differential. Platelet clumping was noted with reported count of 103k. Malaria parasite smear was done and no other parasites were noted.  INTERVAL HISTORY:  Joanne Miller is here for follow-up of her Splenic marginal zone lymphoma. The patient's last visit with [place] was on 06/10/17. The pt reports that she is doing well overall and has enjoyed visiting with her granddaughter recently. She will be having surgery on 7/17 for her ovarian cyst with Dr Janyth Pupa.   The pt reports that she has no new concerns besides her ovarian cyst. She has not noticed any new lumps or bumps, checking for these frequently, and  denies any constitutional symptoms. She has been taking an estrogen/progesterone patch for her hot flashes but is planning on stopping this since it is not effective. She notes that she continues to walk every day and enjoys yoga as well.   Lab results today (09/17/17) of CBC, CMP, and Reticulocytes is as follows: all values are WNL except for Platelets at 140k. LDH is WNL at 212  On review of systems, pt reports hot flashes, and denies fevers, chills, night sweats, mood changes, noticing any enlarged lymph nodes, and any other symptoms.    MEDICAL HISTORY:   #1 Dyslipidemia - not on any medications #2 GERD #3 hot flashes menopausal status was previously on Climara patch #4 history of sebaceous cysts. #5 history of recent left breast cysts - aspirated by ultrasound guidance and noted to be negative for cancer. #6 family history of breast cancer in her sister when she was in her 64s. No genetic testing done at that time.  SURGICAL HISTORY:  #1 breast cysts 2 aspiration on the left side . #2 root canal treatments #3 sebaceous cyst excision   SOCIAL HISTORY: Social History   Socioeconomic History  . Marital status: Widowed    Spouse name: Not on file  . Number of children: Not on file  . Years of education: Not on file  . Highest education level: Not on file  Occupational History  . Not on file  Social Needs  . Financial resource strain: Not on file  . Food insecurity:  Worry: Not on file    Inability: Not on file  . Transportation needs:    Medical: Not on file    Non-medical: Not on file  Tobacco Use  . Smoking status: Never Smoker  . Smokeless tobacco: Never Used  Substance and Sexual Activity  . Alcohol use: No  . Drug use: No  . Sexual activity: Not on file  Lifestyle  . Physical activity:    Days per week: Not on file    Minutes per session: Not on file  . Stress: Not on file  Relationships  . Social connections:    Talks on phone: Not on file    Gets  together: Not on file    Attends religious service: Not on file    Active member of club or organization: Not on file    Attends meetings of clubs or organizations: Not on file    Relationship status: Not on file  . Intimate partner violence:    Fear of current or ex partner: Not on file    Emotionally abused: Not on file    Physically abused: Not on file    Forced sexual activity: Not on file  Other Topics Concern  . Not on file  Social History Narrative  . Not on file    FAMILY HISTORY: History reviewed. No pertinent family history.  ALLERGIES:  is allergic to codeine.  MEDICATIONS:  Current Outpatient Medications  Medication Sig Dispense Refill  . aspirin 81 MG EC tablet Take 81 mg by mouth daily. Swallow whole.    . Misc Natural Products (TART CHERRY ADVANCED PO) Take by mouth.    . Omega-3 Fatty Acids (FISH OIL) 1200 MG CAPS Take by mouth.    . prochlorperazine (COMPAZINE) 10 MG tablet Take 1 tablet (10 mg total) by mouth every 6 (six) hours as needed for nausea or vomiting. 50 tablet 0   No current facility-administered medications for this visit.     REVIEW OF SYSTEMS:    A 10+ POINT REVIEW OF SYSTEMS WAS OBTAINED including neurology, dermatology, psychiatry, cardiac, respiratory, lymph, extremities, GI, GU, Musculoskeletal, constitutional, breasts, reproductive, HEENT.  All pertinent positives are noted in the HPI.  All others are negative.    PHYSICAL EXAMINATION: ECOG PERFORMANCE STATUS: 0 - Asymptomatic  GENERAL:alert, in no acute distress and comfortable SKIN: no acute rashes, no significant lesions EYES: conjunctiva are pink and non-injected, sclera anicteric OROPHARYNX: MMM, no exudates, no oropharyngeal erythema or ulceration NECK: supple, no JVD LYMPH:  no palpable lymphadenopathy in the cervical, axillary or inguinal regions LUNGS: clear to auscultation b/l with normal respiratory effort HEART: regular rate & rhythm ABDOMEN:  normoactive bowel sounds ,  non tender, not distended. Extremity: no pedal edema PSYCH: alert & oriented x 3 with fluent speech NEURO: no focal motor/sensory deficits   LABORATORY DATA:  I have reviewed the data as listed  . CBC Latest Ref Rng & Units 09/17/2017 06/10/2017 03/22/2017  WBC 3.9 - 10.3 K/uL 5.3 5.1 5.2  Hemoglobin 11.6 - 15.9 g/dL 15.1 14.7 14.0  Hematocrit 34.8 - 46.6 % 45.1 43.7 41.9  Platelets 145 - 400 K/uL 140(L) 163 136(L)   . CBC    Component Value Date/Time   WBC 5.3 09/17/2017 0831   RBC 4.68 09/17/2017 0831   RBC 4.68 09/17/2017 0831   HGB 15.1 09/17/2017 0831   HGB 14.7 06/10/2017 0852   HGB 14.0 03/22/2017 0950   HCT 45.1 09/17/2017 0831   HCT 41.9 03/22/2017 0950  PLT 140 (L) 09/17/2017 0831   PLT 163 06/10/2017 0852   PLT 136 (L) 03/22/2017 0950   MCV 96.4 09/17/2017 0831   MCV 93.3 03/22/2017 0950   MCH 32.3 09/17/2017 0831   MCHC 33.5 09/17/2017 0831   RDW 13.3 09/17/2017 0831   RDW 15.0 (H) 03/22/2017 0950   LYMPHSABS 1.6 09/17/2017 0831   LYMPHSABS 1.5 03/22/2017 0950   MONOABS 0.3 09/17/2017 0831   MONOABS 0.3 03/22/2017 0950   EOSABS 0.1 09/17/2017 0831   EOSABS 0.1 03/22/2017 0950   BASOSABS 0.0 09/17/2017 0831   BASOSABS 0.0 03/22/2017 0950    . CMP Latest Ref Rng & Units 09/17/2017 06/10/2017 03/22/2017  Glucose 70 - 140 mg/dL 92 92 56(L)  BUN 7 - 26 mg/dL 17 14 16.3  Creatinine 0.60 - 1.10 mg/dL 0.92 0.80 0.8  Sodium 136 - 145 mmol/L 139 141 141  Potassium 3.5 - 5.1 mmol/L 4.0 4.5 3.8  Chloride 98 - 109 mmol/L 104 106 -  CO2 22 - 29 mmol/L '25 25 26  ' Calcium 8.4 - 10.4 mg/dL 9.6 10.1 9.4  Total Protein 6.4 - 8.3 g/dL 7.0 6.9 6.8  Total Bilirubin 0.2 - 1.2 mg/dL 0.8 0.7 0.89  Alkaline Phos 40 - 150 U/L 96 91 89  AST 5 - 34 U/L '19 19 17  ' ALT 0 - 55 U/L '11 15 11   ' . Lab Results  Component Value Date   LDH 212 09/17/2017       Hematopathology Order10/03/2017 [place] BIOPSY 01/25/2017: Interpretation Fourteen of the twenty  metaphase cells analyzed are normal (46,XX). Six cells are abnormal and contain a deletion within the long arm of chromosome 7 and additional material of unknown origin attached to the long arm of chromosome 14 at or near the [facility] locus. Deletion of 7q is seen in mature B-cell neoplasms including marginal zone lymphoma. Ten cells were analyzed from the 24 hour unstimulated culture and ten cells were analyzed from the 72 hour culture stimulated with pokeweed mitogen, phorbol 12-myristate 13-acetate (PMA), and a CpG-oligodeoxynucleotide (ODN). The abnormal clone was only seen in the 72 hour stimulated culture.   Component Name Value Ref Range  Case Report Surgical Pathology Report Case: [id number]  Authorizing Provider:Rebecca Karie Kirks Sawchak,Collected: 01/25/2017 1100  AGNP  Ordering Location[facility]IONReceived:01/25/2017 [place] Pathologist: Trudee Kuster,  MD  Specimens: A) - Bone Marrow Right - Aspirate  B) - Bone Marrow Right - Biopsy  C) - Peripheral Blood   Final Diagnosis Bone marrow, right iliac, aspiration and biopsy -  Hypercellular bone marrow (70%) with involvement by mature B-cell lymphoma with non-specific immunophenotype (representing approximately 20% of marrow cellularity by immunohistochemical analysis) (See Comment)  -   Cytogenetic studies are pending.     RADIOGRAPHIC STUDIES: I have personally reviewed the radiological images as listed and agreed with the findings in the report. No results found.  ASSESSMENT & PLAN:   67 year old female with   1) Splenic marginal zone lymphoma. Presented with splenomegaly and cytopenia with constitutional sypmtoms fatigue and night sweats - now resolved with Rituxan treatment. HIV, hepatitis C and hepatitis B serologies unrevealing  SPEP shows no M spike and negative IFE.  BM Bx done at [facility] -  Hypercellular bone marrow (70%) with involvement by mature B-cell lymphoma with non-specific immunophenotype (representing approximately 20% of marrow cellularity by immunohistochemical analysis) Cytogenetics 7q deletion  2) Hot flashes grade 2 Plan: -Discussed pt labwork today, 09/17/17; blood  counts and chemistries are stable.  -no overt lab or clinical evidence of SMZL progression at this time. -Stay physically active, drink lots of water -400 units Vitamin E a day -Discussed that SSRIs could be used to treat her hot flashes in place of her current hormone patch -No reservations from hem/onc standpoint for impending surgery for ovarian cyst. -Will see pt back in 3 months  -The pt shows no clinical or lab progression of SML at this time.  -No indication for further treatment at this time.   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 Now resolved with platelet counts improved to  163k  4) Rituxan related Rash - grade 1. Resolved and did not recur with dose 2 and 3 and 4 with adjusted premedications.  5) Left anterior lower chest wall and left upper abdomen discomfort -- likely muscle strain with persistent discomfort. CT chest/abd showed no PE. No splenic infarction or other pathology. PLAN -no indication for additional treatment of the patient SMZL at this time. -warm compresses. -avoid heavy lifting (has been lifting her grand  child)  6) left ovarian cyst - 4.2 cm . 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 CA-125 levels were 28.6 on the normal range of 0-35 . Alpha-fetoprotein was within normal limits and 1.8 . CEA level was 1.3 . -This was noted again on CT of abdomen and pelvis  - evaluated by gyn and scheduled for surgery.  7) family history of breast cancer . Patient notes that her sister had breast cancer in her 7s . Has not had genetic testing . No other family history of breast cancer ovarian cancer pancreatic cancer or melanoma . She has had recent left breast cysts that were aspirated in [place] and per her report were noted to be benign . Plan -yrly MMG with PCP  8) history of depression was previously on Lexapro currently not on any medications . Lost her husband 2009 to throat cancer .    RTC with Dr Irene Limbo in 3 months with labs    All of the patients questions were answered with apparent satisfaction. The patient knows to call the clinic with any problems, questions or concerns.  . The total time spent in the appointment was 25 minutes and more than 50% was on counseling and direct patient cares.   Sullivan Lone MD [place] AAHIVMS [facility] Hematology/Oncology Physician [facility]  (Office):       [phone] (Work cell):  [phone] (Fax):           [phone]  I, Baldwin Jamaica, am acting as a scribe for Dr Irene Limbo.   .I have reviewed the above documentation for accuracy and completeness, and I agree with the above. Brunetta Genera MD

## 2017-09-17 ENCOUNTER — Inpatient Hospital Stay (HOSPITAL_BASED_OUTPATIENT_CLINIC_OR_DEPARTMENT_OTHER): Payer: Medicare HMO | Admitting: Hematology

## 2017-09-17 ENCOUNTER — Inpatient Hospital Stay: Payer: Medicare HMO | Attending: Hematology

## 2017-09-17 ENCOUNTER — Telehealth: Payer: Self-pay | Admitting: Hematology

## 2017-09-17 ENCOUNTER — Encounter: Payer: Self-pay | Admitting: Hematology

## 2017-09-17 VITALS — BP 118/71 | HR 51 | Temp 98.2°F | Resp 18 | Ht 62.0 in | Wt 128.9 lb

## 2017-09-17 DIAGNOSIS — E785 Hyperlipidemia, unspecified: Secondary | ICD-10-CM | POA: Diagnosis not present

## 2017-09-17 DIAGNOSIS — N83202 Unspecified ovarian cyst, left side: Secondary | ICD-10-CM

## 2017-09-17 DIAGNOSIS — C8307 Small cell B-cell lymphoma, spleen: Secondary | ICD-10-CM | POA: Diagnosis not present

## 2017-09-17 DIAGNOSIS — F329 Major depressive disorder, single episode, unspecified: Secondary | ICD-10-CM | POA: Insufficient documentation

## 2017-09-17 DIAGNOSIS — D696 Thrombocytopenia, unspecified: Secondary | ICD-10-CM | POA: Diagnosis not present

## 2017-09-17 DIAGNOSIS — Z79899 Other long term (current) drug therapy: Secondary | ICD-10-CM | POA: Insufficient documentation

## 2017-09-17 DIAGNOSIS — R161 Splenomegaly, not elsewhere classified: Secondary | ICD-10-CM | POA: Insufficient documentation

## 2017-09-17 DIAGNOSIS — K219 Gastro-esophageal reflux disease without esophagitis: Secondary | ICD-10-CM | POA: Diagnosis not present

## 2017-09-17 DIAGNOSIS — Z803 Family history of malignant neoplasm of breast: Secondary | ICD-10-CM | POA: Insufficient documentation

## 2017-09-17 DIAGNOSIS — Z7982 Long term (current) use of aspirin: Secondary | ICD-10-CM | POA: Diagnosis not present

## 2017-09-17 LAB — CBC WITH DIFFERENTIAL/PLATELET
Basophils Absolute: 0 10*3/uL (ref 0.0–0.1)
Basophils Relative: 0 %
EOS PCT: 2 %
Eosinophils Absolute: 0.1 10*3/uL (ref 0.0–0.5)
HEMATOCRIT: 45.1 % (ref 34.8–46.6)
Hemoglobin: 15.1 g/dL (ref 11.6–15.9)
LYMPHS PCT: 31 %
Lymphs Abs: 1.6 10*3/uL (ref 0.9–3.3)
MCH: 32.3 pg (ref 25.1–34.0)
MCHC: 33.5 g/dL (ref 31.5–36.0)
MCV: 96.4 fL (ref 79.5–101.0)
MONO ABS: 0.3 10*3/uL (ref 0.1–0.9)
MONOS PCT: 6 %
NEUTROS ABS: 3.2 10*3/uL (ref 1.5–6.5)
Neutrophils Relative %: 61 %
PLATELETS: 140 10*3/uL — AB (ref 145–400)
RBC: 4.68 MIL/uL (ref 3.70–5.45)
RDW: 13.3 % (ref 11.2–14.5)
WBC: 5.3 10*3/uL (ref 3.9–10.3)

## 2017-09-17 LAB — CMP (CANCER CENTER ONLY)
ALBUMIN: 4.6 g/dL (ref 3.5–5.0)
ALK PHOS: 96 U/L (ref 40–150)
ALT: 11 U/L (ref 0–55)
ANION GAP: 10 (ref 3–11)
AST: 19 U/L (ref 5–34)
BILIRUBIN TOTAL: 0.8 mg/dL (ref 0.2–1.2)
BUN: 17 mg/dL (ref 7–26)
CALCIUM: 9.6 mg/dL (ref 8.4–10.4)
CO2: 25 mmol/L (ref 22–29)
Chloride: 104 mmol/L (ref 98–109)
Creatinine: 0.92 mg/dL (ref 0.60–1.10)
GFR, Est AFR Am: >60 mL/min (ref >60)
GFR, Estimated: >60 mL/min (ref >60)
GLUCOSE: 92 mg/dL (ref 70–140)
POTASSIUM: 4 mmol/L (ref 3.5–5.1)
SODIUM: 139 mmol/L (ref 136–145)
Total Protein: 7 g/dL (ref 6.4–8.3)

## 2017-09-17 LAB — LACTATE DEHYDROGENASE: LDH: 212 U/L (ref 125–245)

## 2017-09-17 LAB — RETICULOCYTES
RBC.: 4.68 MIL/uL (ref 3.70–5.45)
Retic Count, Absolute: 46.8 10*3/uL (ref 33.7–90.7)
Retic Ct Pct: 1 % (ref 0.7–2.1)

## 2017-09-17 NOTE — Telephone Encounter (Signed)
Appointments scheduled AVs/Calendar printed per 6/4 los

## 2017-09-20 DIAGNOSIS — N83202 Unspecified ovarian cyst, left side: Secondary | ICD-10-CM | POA: Diagnosis not present

## 2017-09-20 DIAGNOSIS — Z01818 Encounter for other preprocedural examination: Secondary | ICD-10-CM | POA: Diagnosis not present

## 2017-09-20 DIAGNOSIS — C8580 Other specified types of non-Hodgkin lymphoma, unspecified site: Secondary | ICD-10-CM | POA: Diagnosis not present

## 2017-09-20 DIAGNOSIS — R5383 Other fatigue: Secondary | ICD-10-CM | POA: Diagnosis not present

## 2017-10-08 DIAGNOSIS — R232 Flushing: Secondary | ICD-10-CM | POA: Diagnosis not present

## 2017-10-08 DIAGNOSIS — Z01818 Encounter for other preprocedural examination: Secondary | ICD-10-CM | POA: Diagnosis not present

## 2017-10-08 DIAGNOSIS — N83202 Unspecified ovarian cyst, left side: Secondary | ICD-10-CM | POA: Diagnosis not present

## 2017-10-14 NOTE — H&P (Signed)
67yo PM female who presents for laparoscopic bilateral salpingo-oophorectomy due to adnexal mass.  In review, during the process of work up for splenomegaly she was noted to have an incidental ovarian cyst. Per EPIC chart 4.2cm thin walled unilocular left ovarian cyst with no solid components. She does report occasional intermittent left sided pelvic pain. Usually last for a few minutes and then resolves on its own.  Denies nausea/vomiting, bloating. Initially, the plan was to monitor conservatively; however, she has decided to proceed with surgical intervention.  Last US performed: (01/24/17) 6cm anteverted uterus with 1cm post LUS fibroid. Thin endometrium. Left ovary with simple cyast 5.4cm, avascular. Right ovary not visualized- no adnexal masses seen. Repeat US completed (08/2017): Left ovarian structure slightly larger measuring 5.6x4.4x5.1cm- non vascular. No free fluid  CA125 level 28.6.    Current Medications  Taking   Evening Primrose Oil 500 MG Capsule as directed Orally   Vitamin E 100 UNIT Capsule 1 capsule Orally Once a day   Supplement: , Notes: Hemp oil   Discontinued   Estradiol 0.025 MG/24HR Patch Twice Weekly 1 patch to skin Transdermal Two times a Week   Progesterone Micronized 200 MG Capsule 1 capsule at bedtime Orally Once a day- the first 12 days of each month   Medication List reviewed and reconciled with the patient    Past Medical History  Benign murmur of mild mitral regurgitation.   history of elevated LDL but also high HDL.   Splenomegaly.   Marginal zone lymphoma.           Surgical History  colonoscopy 11/23/16   Family History  Father: alive 66 yrs, High cholesterol, high blood pressure and alcoholism  Mother: alive 16 yrs, Arthritis  Brother 1: Alcoholism  2 brother(s) , 1 sister(s) - healthy.   sister had breast cancer and had a mastectomy\\nNo Family History of Colon Cancer, Polyps, or Liver Disease.   Social History  General:  Tobacco use   cigarettes: Never smoked Tobacco history last updated 10/08/2017 no EXPOSURE TO PASSIVE SMOKE.  Alcohol: yes, 1-2 per week, wine.  no Caffeine.  no Recreational drug use.  Exercise: yes, walks, yoga, wt bearing exercises.  Marital Status: married, widowed.  Children: 2 grown children and 1 grand child.  EDUCATION: College.  OCCUPATION: Retired Pharmacist, hospital.  Travel outside [place]: moved back to [place] from [place] 2 years ago.  no Tobacco Exposure.    Gyn History  Sexual activity not currently sexually active.  Periods : menopausal.  Last mammogram date within the last two years.  Denies H/O STD.    OB History  Number of pregnancies 2.  Pregnancy # 1 vaginal delivery.  Pregnancy # 2 vaginal delivery.    Allergies  Augmentin  Codeine (for allergy): vomiting  MedroxyPROGESTERone Acetate: pased out   Hospitalization/Major Diagnostic Procedure  childbirth x2   Not in the past yr. 10/2016  none recent 08/2017   Review of Systems  CONSTITUTIONAL:  no Chills. no Fever. no Night sweats.  HEENT:  Blurrred vision no. no Double vision.  CARDIOLOGY:  no Chest pain.  RESPIRATORY:  no Shortness of breath. no Cough.  UROLOGY:  no Urinary frequency. no Urinary incontinence. no Urinary urgency.  GASTROENTEROLOGY:  no Abdominal pain. no Appetite change. no Change in bowel movements.  FEMALE REPRODUCTIVE:  no Breast lumps or discharge. no Breast pain. no Unusual vaginal discharge. no Vaginal irritation. no Vaginal itching.  NEUROLOGY:  no Dizziness. no Headache. no Loss of consciousness.  PSYCHOLOGY:  no Anxiety. no Depression.  SKIN:  no Rash. no Hives.  HEMATOLOGY/LYMPH:  no Anemia. no Fatigue.     Vital Signs  Wt 130.6, Wt change 1.6 lb, Ht 62.25, BMI 23.69, Pulse sitting 84, BP sitting 108/72.   Examination  General Examination: CONSTITUTIONAL: well developed, well nourished.  SKIN: warm and dry, no rashes.  NECK: supple, normal appearance.  LUNGS: clear to auscultation  bilaterally, no wheezes, rhonchi, rales.  HEART: no murmurs, regular rate and rhythm.  ABDOMEN: soft and not tender, no masses palpated, no rebound, no rigidity.  MUSCULOSKELETAL no calf tenderness bilaterally.  EXTREMITIES: no edema present.  NEUROLOGIC EXAM: non-focal exam.  PSYCH: appropriate mood and affect.     A/P: 67yo PM female who presents for [facility] bilateral salpingo-oophorectomy due to adnexal mass -NPO -LR @ 125cc/hr -SCDs to OR -Risk/benefit and alternatives including but not limited to risk of bleeding, infection and injury to surrounding organs. Also discussed small, but potential risk of unplanned findings such as cancer requiring further intervention and surgery. Discussed hospital stay and recovery. Reviewed posop activity. All questions and concerns were addressed and inform consent obtained  Janyth Pupa, DO [phone] (cell) [phone] (office)

## 2017-10-16 NOTE — Patient Instructions (Addendum)
Your procedure is scheduled on: Wednesday, July 17  Enter through the [employer] of [facility] at: 12:15 pm  Pick up the phone at the desk and dial [phone].  Call this number if you have problems the morning of surgery: [phone].  Remember: Do NOT eat food after midnight Tuesday  Do NOT drink clear liquids (including water) after 7:30 am Wednesday, day of surgery  Take these medicines the morning of surgery with a SIP OF WATER: None  Bring inhaler with you on day of surgery.  Do Not smoke on the day of surgery.  Brush your teeth day of surgery  Stop herbal medications, vitamin supplements, Ibuprofen/NSAIDS at this time.  Do NOT wear jewelry (body piercing), metal hair clips/bobby pins, make-up, or nail polish. Do NOT wear lotions, powders, or perfumes.  You may wear deoderant. Do NOT shave for 48 hours prior to surgery. Do NOT bring valuables to the hospital. Contacts, dentures, or bridgework may not be worn into surgery.   Have a responsible adult drive you home and stay with you for 24 hours after your procedure

## 2017-10-21 DIAGNOSIS — R7309 Other abnormal glucose: Secondary | ICD-10-CM | POA: Diagnosis not present

## 2017-10-21 DIAGNOSIS — R5383 Other fatigue: Secondary | ICD-10-CM | POA: Diagnosis not present

## 2017-10-21 DIAGNOSIS — C8580 Other specified types of non-Hodgkin lymphoma, unspecified site: Secondary | ICD-10-CM | POA: Diagnosis not present

## 2017-10-21 DIAGNOSIS — Z01818 Encounter for other preprocedural examination: Secondary | ICD-10-CM | POA: Diagnosis not present

## 2017-10-28 ENCOUNTER — Encounter (HOSPITAL_COMMUNITY): Payer: Self-pay

## 2017-10-28 ENCOUNTER — Other Ambulatory Visit: Payer: Self-pay

## 2017-10-28 ENCOUNTER — Other Ambulatory Visit (HOSPITAL_COMMUNITY)
Admission: AD | Admit: 2017-10-28 | Discharge: 2017-10-28 | Disposition: A | Discharge status: Home / Self Care | Payer: Medicare HMO | Source: Ambulatory Visit | Attending: Obstetrics & Gynecology | Admitting: Obstetrics & Gynecology

## 2017-10-28 ENCOUNTER — Encounter (HOSPITAL_COMMUNITY)
Admission: RE | Admit: 2017-10-28 | Discharge: 2017-10-28 | Disposition: A | Discharge status: Home / Self Care | Payer: Medicare HMO | Source: Ambulatory Visit | Attending: Obstetrics & Gynecology | Admitting: Obstetrics & Gynecology

## 2017-10-28 DIAGNOSIS — D271 Benign neoplasm of left ovary: Secondary | ICD-10-CM | POA: Diagnosis not present

## 2017-10-28 DIAGNOSIS — R1909 Other intra-abdominal and pelvic swelling, mass and lump: Secondary | ICD-10-CM | POA: Diagnosis present

## 2017-10-28 DIAGNOSIS — Z79899 Other long term (current) drug therapy: Secondary | ICD-10-CM | POA: Diagnosis not present

## 2017-10-28 DIAGNOSIS — D270 Benign neoplasm of right ovary: Secondary | ICD-10-CM | POA: Diagnosis not present

## 2017-10-28 HISTORY — DX: Personal history of urinary (tract) infections: Z87.440

## 2017-10-28 HISTORY — DX: Malignant (primary) neoplasm, unspecified: C80.1

## 2017-10-28 HISTORY — DX: Unspecified osteoarthritis, unspecified site: M19.90

## 2017-10-28 HISTORY — DX: Personal history of urinary calculi: Z87.442

## 2017-10-28 LAB — CBC
HCT: 41.3 % (ref 36.0–46.0)
Hemoglobin: 14.3 g/dL (ref 12.0–15.0)
MCH: 32.6 pg (ref 26.0–34.0)
MCHC: 34.6 g/dL (ref 30.0–36.0)
MCV: 94.1 fL (ref 78.0–100.0)
Platelets: 138 10*3/uL — ABNORMAL LOW (ref 150–400)
RBC: 4.39 MIL/uL (ref 3.87–5.11)
RDW: 13 % (ref 11.5–15.5)
WBC: 6 10*3/uL (ref 4.0–10.5)

## 2017-10-28 LAB — COMPREHENSIVE METABOLIC PANEL
ALT: 16 U/L (ref 0–44)
ANION GAP: 11 (ref 5–15)
AST: 23 U/L (ref 15–41)
Albumin: 4.6 g/dL (ref 3.5–5.0)
Alkaline Phosphatase: 73 U/L (ref 38–126)
BILIRUBIN TOTAL: 1 mg/dL (ref 0.3–1.2)
BUN: 16 mg/dL (ref 8–23)
CO2: 22 mmol/L (ref 22–32)
Calcium: 9.4 mg/dL (ref 8.9–10.3)
Chloride: 102 mmol/L (ref 98–111)
Creatinine, Ser: 0.72 mg/dL (ref 0.44–1.00)
GFR calc Af Amer: >60 mL/min (ref >60)
Glucose, Bld: 100 mg/dL — ABNORMAL HIGH (ref 70–99)
POTASSIUM: 4.2 mmol/L (ref 3.5–5.1)
Sodium: 135 mmol/L (ref 135–145)
TOTAL PROTEIN: 7.1 g/dL (ref 6.5–8.1)

## 2017-10-28 NOTE — Pre-Procedure Instructions (Signed)
Dr. Ambrose Pancoast viewed and okay 'd EKG. AWARE OF HISTORY AND PLATLETT. COUNT OF 138

## 2017-10-28 NOTE — Pre-Procedure Instructions (Signed)
Carolyn from blood bank  called concerned about type and screen results. Blood will have to be sent out to another lab. Surgery may need to be delayed if MD thinks she will need transfusion. Dr. Nelda Marseille called updated will call lab and let me know.

## 2017-10-28 NOTE — Pre-Procedure Instructions (Signed)
Called patient and no voicemail, called her daughter Seth Bake and left message on her voicemail

## 2017-10-30 ENCOUNTER — Ambulatory Visit (HOSPITAL_COMMUNITY): Payer: Medicare HMO | Admitting: Certified Registered Nurse Anesthetist

## 2017-10-30 ENCOUNTER — Ambulatory Visit (HOSPITAL_COMMUNITY)
Admission: RE | Admit: 2017-10-30 | Discharge: 2017-10-30 | Disposition: A | Discharge status: Home / Self Care | Payer: Medicare HMO | Source: Ambulatory Visit | Attending: Obstetrics & Gynecology | Admitting: Obstetrics & Gynecology

## 2017-10-30 ENCOUNTER — Encounter (HOSPITAL_COMMUNITY): Payer: Self-pay | Admitting: Emergency Medicine

## 2017-10-30 ENCOUNTER — Encounter (HOSPITAL_COMMUNITY)
Admission: RE | Disposition: A | Discharge status: Home / Self Care | Payer: Self-pay | Source: Ambulatory Visit | Attending: Obstetrics & Gynecology

## 2017-10-30 DIAGNOSIS — D270 Benign neoplasm of right ovary: Secondary | ICD-10-CM | POA: Diagnosis not present

## 2017-10-30 DIAGNOSIS — Z79899 Other long term (current) drug therapy: Secondary | ICD-10-CM | POA: Insufficient documentation

## 2017-10-30 DIAGNOSIS — R8569 Abnormal cytological findings in specimens from other digestive organs and abdominal cavity: Secondary | ICD-10-CM | POA: Diagnosis not present

## 2017-10-30 DIAGNOSIS — D271 Benign neoplasm of left ovary: Secondary | ICD-10-CM | POA: Insufficient documentation

## 2017-10-30 DIAGNOSIS — D279 Benign neoplasm of unspecified ovary: Secondary | ICD-10-CM | POA: Diagnosis not present

## 2017-10-30 DIAGNOSIS — N83202 Unspecified ovarian cyst, left side: Secondary | ICD-10-CM | POA: Diagnosis not present

## 2017-10-30 DIAGNOSIS — R1909 Other intra-abdominal and pelvic swelling, mass and lump: Secondary | ICD-10-CM | POA: Diagnosis not present

## 2017-10-30 HISTORY — PX: LAPAROSCOPIC SALPINGO OOPHERECTOMY: SHX5927

## 2017-10-30 SURGERY — SALPINGO-OOPHORECTOMY, LAPAROSCOPIC
Anesthesia: General | Site: Abdomen | Laterality: Bilateral

## 2017-10-30 MED ORDER — HYDROCODONE-ACETAMINOPHEN 5-325MG PREPACK (~~LOC~~: 1.0000 | ORAL_TABLET | Freq: Four times a day (QID) | ORAL | 0 refills | Status: AC | PRN

## 2017-10-30 MED ORDER — BUPIVACAINE HCL (PF) 0.25 % IJ SOLN
INTRAMUSCULAR | Status: DC | PRN
  Administered 2017-10-30: 30 mL

## 2017-10-30 MED ORDER — ROCURONIUM BROMIDE 100 MG/10ML IV SOLN
INTRAVENOUS | Status: DC | PRN
  Administered 2017-10-30: 40 mg via INTRAVENOUS

## 2017-10-30 MED ORDER — SUGAMMADEX SODIUM 200 MG/2ML IV SOLN
INTRAVENOUS | Status: DC | PRN
  Administered 2017-10-30: 150 mg via INTRAVENOUS

## 2017-10-30 MED ORDER — IBUPROFEN 600 MG PO TABS: 600.0000 mg | ORAL_TABLET | Freq: Four times a day (QID) | ORAL | 0 refills | Status: DC | PRN

## 2017-10-30 MED ORDER — PROPOFOL 10 MG/ML IV BOLUS
INTRAVENOUS | Status: DC | PRN
  Administered 2017-10-30: 120 mg via INTRAVENOUS

## 2017-10-30 MED ORDER — KETOROLAC TROMETHAMINE 30 MG/ML IJ SOLN
INTRAMUSCULAR | Status: DC | PRN
  Administered 2017-10-30: 30 mg via INTRAVENOUS

## 2017-10-30 MED ORDER — DEXAMETHASONE SODIUM PHOSPHATE 10 MG/ML IJ SOLN
INTRAMUSCULAR | Status: DC | PRN
  Administered 2017-10-30: 4 mg via INTRAVENOUS

## 2017-10-30 MED ORDER — BUPIVACAINE HCL (PF) 0.25 % IJ SOLN
INTRAMUSCULAR | Status: AC
  Filled 2017-10-30: qty 30

## 2017-10-30 MED ORDER — LIDOCAINE HCL (PF) 1 % IJ SOLN
INTRAMUSCULAR | Status: AC
  Filled 2017-10-30: qty 5

## 2017-10-30 MED ORDER — LACTATED RINGERS IV SOLN
INTRAVENOUS | Status: DC
  Administered 2017-10-30 (×2): via INTRAVENOUS

## 2017-10-30 MED ORDER — METOCLOPRAMIDE HCL 5 MG/ML IJ SOLN: 10.0000 mg | Freq: Once | INTRAMUSCULAR | Status: DC | PRN

## 2017-10-30 MED ORDER — SODIUM CHLORIDE 0.9 % IR SOLN
Status: DC | PRN
  Administered 2017-10-30: 3000 mL

## 2017-10-30 MED ORDER — FENTANYL CITRATE (PF) 100 MCG/2ML IJ SOLN
25.0000 ug | INTRAMUSCULAR | Status: DC | PRN
  Administered 2017-10-30 (×3): 25 ug via INTRAVENOUS

## 2017-10-30 MED ORDER — DEXAMETHASONE SODIUM PHOSPHATE 4 MG/ML IJ SOLN
INTRAMUSCULAR | Status: AC
  Filled 2017-10-30: qty 1

## 2017-10-30 MED ORDER — FENTANYL CITRATE (PF) 100 MCG/2ML IJ SOLN
INTRAMUSCULAR | Status: AC
  Administered 2017-10-30: 25 ug via INTRAVENOUS
  Filled 2017-10-30: qty 2

## 2017-10-30 MED ORDER — SODIUM CHLORIDE 0.9 % IJ SOLN
INTRAMUSCULAR | Status: DC | PRN
  Administered 2017-10-30: 10 mL

## 2017-10-30 MED ORDER — FENTANYL CITRATE (PF) 100 MCG/2ML IJ SOLN
INTRAMUSCULAR | Status: DC | PRN
  Administered 2017-10-30 (×3): 50 ug via INTRAVENOUS

## 2017-10-30 MED ORDER — PROPOFOL 10 MG/ML IV BOLUS
INTRAVENOUS | Status: AC
  Filled 2017-10-30: qty 20

## 2017-10-30 MED ORDER — KETOROLAC TROMETHAMINE 30 MG/ML IJ SOLN
INTRAMUSCULAR | Status: AC
  Filled 2017-10-30: qty 1

## 2017-10-30 MED ORDER — FENTANYL CITRATE (PF) 250 MCG/5ML IJ SOLN
INTRAMUSCULAR | Status: AC
  Filled 2017-10-30: qty 5

## 2017-10-30 MED ORDER — ONDANSETRON HCL 4 MG/2ML IJ SOLN
INTRAMUSCULAR | Status: AC
  Filled 2017-10-30: qty 2

## 2017-10-30 MED ORDER — ONDANSETRON HCL 4 MG/2ML IJ SOLN
INTRAMUSCULAR | Status: DC | PRN
  Administered 2017-10-30: 4 mg via INTRAVENOUS

## 2017-10-30 MED ORDER — SODIUM CHLORIDE 0.9 % IJ SOLN
INTRAMUSCULAR | Status: AC
  Filled 2017-10-30: qty 10

## 2017-10-30 MED ORDER — LACTATED RINGERS IV SOLN: INTRAVENOUS | Status: DC

## 2017-10-30 MED ORDER — ONDANSETRON HCL 4 MG PO TABS: 4.0000 mg | ORAL_TABLET | Freq: Three times a day (TID) | ORAL | 0 refills | Status: DC | PRN

## 2017-10-30 MED ORDER — MEPERIDINE HCL 25 MG/ML IJ SOLN: 6.2500 mg | INTRAMUSCULAR | Status: DC | PRN

## 2017-10-30 MED ORDER — DOCUSATE SODIUM 100 MG PO CAPS: 100.0000 mg | ORAL_CAPSULE | Freq: Two times a day (BID) | ORAL | 0 refills | Status: DC

## 2017-10-30 MED ORDER — SUGAMMADEX SODIUM 200 MG/2ML IV SOLN
INTRAVENOUS | Status: AC
  Filled 2017-10-30: qty 2

## 2017-10-30 MED ORDER — LIDOCAINE HCL (CARDIAC) PF 100 MG/5ML IV SOSY
PREFILLED_SYRINGE | INTRAVENOUS | Status: DC | PRN
  Administered 2017-10-30: 50 mg via INTRAVENOUS

## 2017-10-30 MED ORDER — MIDAZOLAM HCL 2 MG/2ML IJ SOLN
INTRAMUSCULAR | Status: DC | PRN
  Administered 2017-10-30: 1 mg via INTRAVENOUS

## 2017-10-30 MED ORDER — MIDAZOLAM HCL 2 MG/2ML IJ SOLN
INTRAMUSCULAR | Status: AC
  Filled 2017-10-30: qty 2

## 2017-10-30 SURGICAL SUPPLY — 30 items
APPLICATOR ARISTA FLEXITIP XL (MISCELLANEOUS) ×2 IMPLANT
DERMABOND ADVANCED (GAUZE/BANDAGES/DRESSINGS) ×1
DERMABOND ADVANCED .7 DNX12 (GAUZE/BANDAGES/DRESSINGS) ×1 IMPLANT
DRSG OPSITE POSTOP 3X4 (GAUZE/BANDAGES/DRESSINGS) ×2 IMPLANT
DURAPREP 26ML APPLICATOR (WOUND CARE) ×2 IMPLANT
FORCEPS CUTTING 33CM 5MM (CUTTING FORCEPS) IMPLANT
GLOVE BIOGEL PI IND STRL 7.0 (GLOVE) ×4 IMPLANT
GLOVE BIOGEL PI INDICATOR 7.0 (GLOVE) ×4
GLOVE ECLIPSE 6.5 STRL STRAW (GLOVE) ×2 IMPLANT
GOWN STRL REUS W/TWL LRG LVL3 (GOWN DISPOSABLE) ×4 IMPLANT
HEMOSTAT ARISTA ABSORB 3G PWDR (MISCELLANEOUS) ×2 IMPLANT
NEEDLE INSUFFLATION 120MM (ENDOMECHANICALS) ×2 IMPLANT
PACK LAPAROSCOPY BASIN (CUSTOM PROCEDURE TRAY) ×2 IMPLANT
PACK TRENDGUARD 450 HYBRID PRO (MISCELLANEOUS) ×1 IMPLANT
PAD OB MATERNITY 4.3X12.25 (PERSONAL CARE ITEMS) ×2 IMPLANT
POUCH SPECIMEN RETRIEVAL 10MM (ENDOMECHANICALS) ×2 IMPLANT
PROTECTOR NERVE ULNAR (MISCELLANEOUS) ×4 IMPLANT
SET IRRIG TUBING LAPAROSCOPIC (IRRIGATION / IRRIGATOR) ×2 IMPLANT
SHEARS HARMONIC ACE PLUS 36CM (ENDOMECHANICALS) ×2 IMPLANT
SLEEVE XCEL OPT CAN 5 100 (ENDOMECHANICALS) ×2 IMPLANT
SOLUTION ELECTROLUBE (MISCELLANEOUS) ×2 IMPLANT
SUT MON AB 4-0 PS1 27 (SUTURE) ×2 IMPLANT
SUT VICRYL 0 UR6 27IN ABS (SUTURE) ×2 IMPLANT
TOWEL OR 17X24 6PK STRL BLUE (TOWEL DISPOSABLE) ×4 IMPLANT
TRAY FOLEY W/BAG SLVR 14FR (SET/KITS/TRAYS/PACK) ×2 IMPLANT
TRENDGUARD 450 HYBRID PRO PACK (MISCELLANEOUS) ×2
TROCAR XCEL NON-BLD 11X100MML (ENDOMECHANICALS) ×2 IMPLANT
TROCAR XCEL NON-BLD 5MMX100MML (ENDOMECHANICALS) ×2 IMPLANT
TUBING INSUF HEATED (TUBING) ×2 IMPLANT
WARMER LAPAROSCOPE (MISCELLANEOUS) ×2 IMPLANT

## 2017-10-30 NOTE — Anesthesia Postprocedure Evaluation (Signed)
Anesthesia Post Note  Patient: Joanne Miller  Procedure(s) Performed: LAPAROSCOPIC BILATERAL SALPINGO OOPHORECTOMY, WITH PELVIC WASHINGS (Bilateral Abdomen)     Patient location during evaluation: PACU Anesthesia Type: General Level of consciousness: awake and alert Pain management: pain level controlled Vital Signs Assessment: post-procedure vital signs reviewed and stable Respiratory status: spontaneous breathing, nonlabored ventilation and respiratory function stable Cardiovascular status: blood pressure returned to baseline and stable Postop Assessment: no apparent nausea or vomiting Anesthetic complications: no Comments: Had some chest discomfort/ pressure radiating to left arm. EKG obtained, no acute changes.    Last Vitals:  Vitals:   10/30/17 1131 10/30/17 1409  BP: (!) 148/73   Pulse: (!) 55   Resp: 16 16  Temp: 36.5 C 36.5 C  SpO2: 100%     Last Pain:  Vitals:   10/30/17 1500  TempSrc:   PainSc: 1    Pain Goal: Patients Stated Pain Goal: 3 (10/30/17 1500)               Avry Monteleone A.

## 2017-10-30 NOTE — Interval H&P Note (Signed)
History and Physical Interval Note:  10/30/2017 11:52 AM  Joanne Miller  has presented today for surgery, with the diagnosis of N83.9 Ovarian Mass, left  The various methods of treatment have been discussed with the patient and family. After consideration of risks, benefits and other options for treatment, the patient has consented to  Procedure(s) with comments: [place] (Bilateral) - Laparoscopic as a surgical intervention .  The patient's history has been reviewed, patient examined, no change in status, stable for surgery.  I have reviewed the patient's chart and labs.  Questions were answered to the patient's satisfaction.     Annalee Genta

## 2017-10-30 NOTE — Transfer of Care (Signed)
Immediate Anesthesia Transfer of Care Note  Patient: TENNESSEE HANLON  Procedure(s) Performed: LAPAROSCOPIC BILATERAL SALPINGO OOPHORECTOMY, WITH PELVIC WASHINGS (Bilateral Abdomen)  Patient Location: PACU  Anesthesia Type:General  Level of Consciousness: awake, alert  and oriented  Airway & Oxygen Therapy: Patient Spontanous Breathing and Patient connected to nasal cannula oxygen  Post-op Assessment: Report given to RN and Post -op Vital signs reviewed and stable  Post vital signs: Reviewed and stable  Last Vitals:  Vitals Value Taken Time  BP 146/65 10/30/2017  2:09 PM  Temp    Pulse 65 10/30/2017  2:12 PM  Resp 8 10/30/2017  2:12 PM  SpO2 100 % 10/30/2017  2:12 PM  Vitals shown include unvalidated device data.  Last Pain:  Vitals:   10/30/17 1131  TempSrc: Oral      Patients Stated Pain Goal: 3 (68/12/75 1700)  Complications: No apparent anesthesia complications

## 2017-10-30 NOTE — Discharge Instructions (Addendum)
°Post Anesthesia Home Care Instructions ° °Activity: °Get plenty of rest for the remainder of the day. A responsible individual must stay with you for 24 hours following the procedure.  °For the next 24 hours, DO NOT: °-Drive a car °-Operate machinery °-Drink alcoholic beverages °-Take any medication unless instructed by your physician °-Make any legal decisions or sign important papers. ° °Meals: °Start with liquid foods such as gelatin or soup. Progress to regular foods as tolerated. Avoid greasy, spicy, heavy foods. If nausea and/or vomiting occur, drink only clear liquids until the nausea and/or vomiting subsides. Call your physician if vomiting continues. ° °Special Instructions/Symptoms: °Your throat may feel dry or sore from the anesthesia or the breathing tube placed in your throat during surgery. If this causes discomfort, gargle with warm salt water. The discomfort should disappear within 24 hours. ° °If you had a scopolamine patch placed behind your ear for the management of post- operative nausea and/or vomiting: ° °1. The medication in the patch is effective for 72 hours, after which it should be removed.  Wrap patch in a tissue and discard in the trash. Wash hands thoroughly with soap and water. °2. You may remove the patch earlier than 72 hours if you experience unpleasant side effects which may include dry mouth, dizziness or visual disturbances. °3. Avoid touching the patch. Wash your hands with soap and water after contact with the patch. °  °DISCHARGE INSTRUCTIONS: Laparoscopy ° °The following instructions have been prepared to help you care for yourself upon your return home today. ° °Wound care: °• Do not get the incision wet for the first 24 hours. The incision should be kept clean and dry. °• The Band-Aids or dressings may be removed the day after surgery. °• Should the incision become sore, red, and swollen after the first week, check with your doctor. ° °Personal hygiene: °• Shower the day  after your procedure. ° °Activity and limitations: °• Do NOT drive or operate any equipment today. °• Do NOT lift anything more than 15 pounds for 2-3 weeks after surgery. °• Do NOT rest in bed all day. °• Walking is encouraged. Walk each day, starting slowly with 5-minute walks 3 or 4 times a day. Slowly increase the length of your walks. °• Walk up and down stairs slowly. °• Do NOT do strenuous activities, such as golfing, playing tennis, bowling, running, biking, weight lifting, gardening, mowing, or vacuuming for 2-4 weeks. Ask your doctor when it is okay to start. ° °Diet: Eat a light meal as desired this evening. You may resume your usual diet tomorrow. ° °Return to work: This is dependent on the type of work you do. For the most part you can return to a desk job within a week of surgery. If you are more active at work, please discuss this with your doctor. ° °What to expect after your surgery: You may have a slight burning sensation when you urinate on the first day. You may have a very small amount of blood in the urine. Expect to have a small amount of vaginal discharge/light bleeding for 1-2 weeks. It is not unusual to have abdominal soreness and bruising for up to 2 weeks. You may be tired and need more rest for about 1 week. You may experience shoulder pain for 24-72 hours. Lying flat in bed may relieve it. ° °Call your doctor for any of the following: °• Develop a fever of 100.4 or greater °• Inability to urinate 6 hours after discharge   from hospital  Severe pain not relieved by pain medications  Persistent of heavy bleeding at incision site  Redness or swelling around incision site after a week  Increasing nausea or vomiting  Patient Signature________________________________________ Nurse Signature_________________________________________HOME INSTRUCTIONS  Please note any unusual or excessive bleeding, pain, swelling. Mild dizziness or drowsiness are normal for about 24 hours after  surgery.   Shower when comfortable  Restrictions: No driving for 24 hours or while taking pain medications.  Activity:  No heavy lifting (> 10 lbs), nothing in vagina (no tampons, douching, or intercourse) x 2 weeks; no tub baths for 2 weeks Vaginal spotting is expected but if your bleeding is heavy, period like,  please call the office   Incision: the bandaids will fall off when they are ready to; you may clean your incision with mild soap and water but do not rub or scrub the incision site.  You may experience slight bloody drainage from your incision periodically.  This is normal.  If you experience a large amount of drainage or the incision opens, please call your physician who will likely direct you to the emergency department.  Diet:  You may return to your regular diet.  Do not eat large meals.  Eat small frequent meals throughout the day.  Continue to drink a good amount of water at least 6-8 glasses of water per day, hydration is very important for the healing process.  Pain Management: Take Motrin every 6 hours for pain.  For severe pain, take Vicodin every 6 hours as needed.  This medication may cause nausea and/or constipation.  You may take Zofran to help with the nausea.  If the vicodin is too strong, you may take Tylenol instead.  (Vicodin is a combination of hydrocodone and tylenol)  Always take prescription pain medication with food, it may cause constipation, increase fluids and fiber and you may want to take an over-the-counter stool softener like Colace as needed up to 2x a day.    Alcohol -- Avoid for 24 hours and while taking pain medications.  Nausea: Take sips of ginger ale or soda  Fever -- Call physician if temperature over 101 degrees  Follow up:  If you do not already have a follow up appointment scheduled, please call the office at [phone].  If you experience fever (a temperature greater than 100.4), pain unrelieved by pain medication, shortness of breath,  swelling of a single leg, or any other symptoms which are concerning to you please the office immediately.

## 2017-10-30 NOTE — Anesthesia Procedure Notes (Signed)
Procedure Name: Intubation Date/Time: 10/30/2017 12:47 PM Performed by: Bufford Spikes, CRNA Pre-anesthesia Checklist: Patient identified, Emergency Drugs available, Suction available and Patient being monitored Patient Re-evaluated:Patient Re-evaluated prior to induction Oxygen Delivery Method: Circle system utilized Preoxygenation: Pre-oxygenation with 100% oxygen Induction Type: IV induction Ventilation: Mask ventilation without difficulty Laryngoscope Size: Miller and 2 Grade View: Grade II Tube type: Oral Tube size: 7.0 mm Number of attempts: 1 Airway Equipment and Method: Stylet and Oral airway Placement Confirmation: ETT inserted through vocal cords under direct vision,  positive ETCO2 and breath sounds checked- equal and bilateral Secured at: 21 cm Tube secured with: Tape Dental Injury: Teeth and Oropharynx as per pre-operative assessment

## 2017-10-30 NOTE — Op Note (Signed)
PREOPERATIVE DIAGNOSIS:  Left adnexal mass POSTOPERATIVE DIAGNOSIS: Same PROCEDURE PERFORMED: Laparoscopic bilateral salpingo-oophorectomy SURGEON: Dr. Janyth Pupa ASSISTANT:Dr. Christophe Louis ANESTHESIA: General endotracheal.  ESTIMATED BLOOD LOSS: 10cc.  URINE OUTPUT: 50cc of clear yellow urine at the end of the procedure.  IV FLUIDS: 1300cc of crystalloid.  SPECIMEN(S): 1) pelvic washing   2) Bilateral tubes and ovaries COMPLICATIONS: None.  CONDITION: Stable.  FINDINGS: No ascites or peritoneal studding was appreciated.  Liver, gallbladder and bowel appeared grossly normal. Uterus normal size and shape.  Ovaries and tubes were normal in appearance.    Informed consent was obtained from the patient prior to taking her to the operating room where anesthesia was found to be adequate. She was placed in dorsal lithotomy position and examined under anesthesia. She was prepped and draped in normal sterile fashion. The bladder was catheterized with a foley under sterile technique.  A bi-valve speculum was then placed and the anterior lip of the cervix was grasped with the single tooth tenaculum. The Hulka manipulator was placed.  The speculum and tenaculum were then removed.  Attention was then turned to the patients abdomen where a 5 mm infraumbilical skin incision was made with the scalpel. The veress needle was carefully introduced into the peritoneal cavity while tenting the abdominal wall. Intraperitoneal placement was confirmed by use of a saline-drop test.  The gas was connected and confirmed intrabdominal placement by a low initial pressure of 81mmHg. The abdomen was then insuflated with CO2 gas. The trocar and sleeve were then advanced without difficulty into the abdomen under direct visualization. Intraabdominal placement was confirmed by the laparoscope and surveillance of the abdomen was performed. Grossly normal appearing abdomen as mentioned in findings above.  Two additional ports were  placed- in the left lower quadrant a 53mm incision and in the right a 43mm incision.  Both were placed under under direct visualization.   Peritoneal washings were obtained and the pelvis was inspected with the findings as mentioned above.  The left ovary was then placed on tension and using the Harmonic, the left infundipulopelvic ligament was clamped and ligated. Serial ligations were used for full dissection of the left ovary and fallopian tube. Attention was then turned to the right  In a similar fashion the right tube and ovary were excised using the Harmonic.  The endocatch bag was then placed through the 10 mm port and the specimens were removed in their entirety. Re-examination of the uterus and abdominal cavity confirmed hemostasis. Irrigation was performed and excellent hemostasis was confirmed.  Arista was placed.  The instruments were then removed from the patients abdomen with air allowed to fully escape.  The left lower quadrant fascia was closed in a running fashion using 0-vicryl.   The port sites were closed with dermabond. The manipulator was removed from the cervix with no lacerations or bleeding identified. The patient tolerated the procedure well with all sponge, lap, and needle counts correct. The patient was taken to recovery in stable condition.   Dr. Landry Mellow was present to assist due to the complexity of the case and that no residents are available to our service  Janyth Pupa, DO [phone] (cell) [phone] (office)

## 2017-10-30 NOTE — Anesthesia Preprocedure Evaluation (Addendum)
Anesthesia Evaluation  Patient identified by MRN, date of birth, ID band Patient awake    Reviewed: Allergy & Precautions, NPO status , Patient's Chart, lab work & pertinent test results  Airway Mallampati: II  TM Distance: >3 FB Neck ROM: Full    Dental no notable dental hx. (+) Teeth Intact   Pulmonary neg pulmonary ROS,    Pulmonary exam normal breath sounds clear to auscultation       Cardiovascular negative cardio ROS Normal cardiovascular exam Rhythm:Regular Rate:Normal     Neuro/Psych negative neurological ROS  negative psych ROS   GI/Hepatic negative GI ROS, Neg liver ROS,   Endo/Other  negative endocrine ROS  Renal/GU negative Renal ROS  negative genitourinary   Musculoskeletal  (+) Arthritis ,   Abdominal   Peds  Hematology Hx/o splenic b-cell lymphoma Thrombocytopenia-mild   Anesthesia Other Findings   Reproductive/Obstetrics Left adnexal mass                            Anesthesia Physical Anesthesia Plan  ASA: II  Anesthesia Plan: General   Post-op Pain Management:    Induction: Intravenous  PONV Risk Score and Plan: 4 or greater and Midazolam, Ondansetron, Dexamethasone and Treatment may vary due to age or medical condition  Airway Management Planned: Oral ETT  Additional Equipment:   Intra-op Plan:   Post-operative Plan: Extubation in OR  Informed Consent: I have reviewed the patients History and Physical, chart, labs and discussed the procedure including the risks, benefits and alternatives for the proposed anesthesia with the patient or authorized representative who has indicated his/her understanding and acceptance.   Dental advisory given  Plan Discussed with: CRNA and Surgeon  Anesthesia Plan Comments:         Anesthesia Quick Evaluation

## 2017-10-31 ENCOUNTER — Encounter (HOSPITAL_COMMUNITY): Payer: Self-pay | Admitting: Obstetrics & Gynecology

## 2017-11-01 LAB — TYPE AND SCREEN
ABO/RH(D): A POS
Antibody Screen: POSITIVE
PT AG TYPE: NEGATIVE
UNIT DIVISION: 0
UNIT DIVISION: 0
Unit division: 0
Unit division: 0

## 2017-11-01 LAB — BPAM RBC
BLOOD PRODUCT EXPIRATION DATE: 201908072359
BLOOD PRODUCT EXPIRATION DATE: 201908102359
Blood Product Expiration Date: 201908072359
Blood Product Expiration Date: 201908102359
ISSUE DATE / TIME: 201907180506
ISSUE DATE / TIME: 201907180506
UNIT TYPE AND RH: 5100
UNIT TYPE AND RH: 6200
Unit Type and Rh: 5100
Unit Type and Rh: 6200

## 2017-12-05 ENCOUNTER — Other Ambulatory Visit: Payer: Self-pay | Admitting: Family Medicine

## 2017-12-05 DIAGNOSIS — Z1231 Encounter for screening mammogram for malignant neoplasm of breast: Secondary | ICD-10-CM

## 2017-12-13 NOTE — Progress Notes (Signed)
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 female who has been self referred to [place] for evaluation and management of newly noted splenomegaly.  Patient does not have many chronic medical comorbidities. 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 She also visited [place], [place], [place] and a few other places last 4 years.  A couple months ago while still in [place] she noticed some fullness in the upper and left upper abdomen and had an ultrasound which showed splenomegaly with the spleen length of 16.6 cm with no overt focal splenic lesions. No overt abdominal adenopathy noted. No free fluid. Liver, gallbladder, pancreas, both kidneys loss of bowel were noted to be within normal limits. There was incidental finding of a 4.2 cm thin-walled unilocular left ovarian cyst with no solid components. Normal uterus and right ovary was seen.  Her blood counts done on 09/02/2015 showed normal hemoglobin of 14 with an MCV of 91, WBC count of 5.9k with normal differential. Platelet clumping was noted with reported count of 103k. Malaria parasite smear was done and no other parasites were noted.  INTERVAL HISTORY:  Joanne Miller is here for management and evaluation of her Splenic marginal zone lymphoma. The patient's last visit with [place] was on 09/17/17. The pt reports that she is doing well overall.   The pt reports that she has had no new concerns in the interim. She notes that she has continued to have several hot flashes, which has worsened since her bilateral salpingo oophorectomy on 10/30/17, with benign pathology. She is currently taking Vitamin E, drinks evening  primrose tea, and is staying active. She notes that this is not affecting her quality of life too much.  She also notes that she hasn't had any concerns for bleeding and denies having any constitutional symptoms.   Lab results today (12/18/17) of CBC w/diff is as follows: all values are WNL except for PLT at 132k. 12/18/17 LDH is. Lab Results  Component Value Date   LDH 179 12/18/2017     On review of systems, pt reports hot flashes, staying active, good energy levels, eating well, healed surgical incisions, and denies fevers, chills, drenching night sweats, bleeding issues, new fatigue, noticing any new lumps or bums, back pain, bone pains, abdominal pains, and any other symptoms.    MEDICAL HISTORY:   #1 Dyslipidemia - not on any medications #2 GERD #3 hot flashes menopausal status was previously on [place]im[place] patch #4 history of sebaceous cysts. #5 history of recent left breast cysts - aspirated by ultrasound guidance and noted to be negative for cancer. #6 family history of breast cancer in her sister when she was in her 66s. No genetic testing done at that time.  SURGICAL HISTORY:  #1 breast cysts 2 aspiration on the left side . #2 root canal treatments #3 sebaceous cyst excision   SOCIAL HISTORY: Social History   Socioeconomic History  . Marital status: Widowed    Spouse name: Not on file  . Number of children: Not on file  . Years of education: Not on file  . Highest education level: Not on file  Occupational History  . Not on file  Social Needs  . Financial resource strain: Not on file  . Food insecurity:    Worry: Not on file    Inability: Not on file  . Transportation needs:    Medical: Not on file    Non-medical: Not on file  Tobacco Use  . Smoking status: Never Smoker  . Smokeless tobacco: Never Used  Substance and Sexual Activity  . Alcohol use: Yes    Alcohol/week: 1.0 standard drinks    Types: 1 Glasses of wine per week    Comment: occasional  . Drug  use: No  . Sexual activity: Not on file  Lifestyle  . Physical activity:    Days per week: Not on file    Minutes per session: Not on file  . Stress: Not on file  Relationships  . Social connections:    Talks on phone: Not on file    Gets together: Not on file    Attends religious service: Not on file    Active member of club or organization: Not on file    Attends meetings of clubs or organizations: Not on file    Relationship status: Not on file  . Intimate partner violence:    Fear of current or ex partner: Not on file    Emotionally abused: Not on file    Physically abused: Not on file    Forced sexual activity: Not on file  Other Topics Concern  . Not on file  Social History Narrative  . Not on file    FAMILY HISTORY: History reviewed. No pertinent family history.  ALLERGIES:  is allergic to codeine.  MEDICATIONS:  Current Outpatient Medications  Medication Sig Dispense Refill  . vitamin E 100 UNIT capsule Take by mouth daily.    [name] aspirin 81 MG EC tablet Take 81 mg by mouth daily. Swallow whole.    . docusate sodium (COLACE) 100 MG capsule Take 1 capsule (100 mg total) by mouth 2 (two) times daily. (Patient not taking: Reported on 12/18/2017) 30 capsule 0  . ibuprofen (ADVIL,MOTRIN) 600 MG tablet Take 1 tablet (600 mg total) by mouth every 6 (six) hours as needed. (Patient not taking: Reported on 12/18/2017) 30 tablet 0  . Misc Natural Products (TART CHERRY ADVANCED PO) Take by mouth.    . Omega-3 Fatty Acids (FISH OIL) 1200 MG CAPS Take by mouth.    . ondansetron (ZOFRAN) 4 MG tablet Take 1 tablet (4 mg total) by mouth every 8 (eight) hours as needed for nausea or vomiting. (Patient not taking: Reported on 12/18/2017) 20 tablet 0   No current facility-administered medications for this visit.     REVIEW OF SYSTEMS:    A 10+ POINT REVIEW OF SYSTEMS WAS OBTAINED including neurology, dermatology, psychiatry, cardiac, respiratory, lymph, extremities, GI, GU,  Musculoskeletal, constitutional, breasts, reproductive, HEENT.  All pertinent positives are noted in the HPI.  All others are negative.   PHYSICAL EXAMINATION: ECOG PERFORMANCE STATUS: 0 - Asymptomatic  GENERAL:alert, in no acute distress and comfortable SKIN: no acute rashes, no significant lesions EYES: conjunctiva are pink and non-injected, sclera anicteric OROPHARYNX: MMM, no exudates, no oropharyngeal erythema or ulceration NECK: supple, no JVD LYMPH:  no palpable lymphadenopathy in the cervical, axillary or inguinal regions LUNGS: clear to auscultation b/l with normal respiratory effort HEART: regular rate & rhythm ABDOMEN:  normoactive bowel sounds , non tender, not distended. No palpable hepatosplenomegaly.  Extremity: no pedal edema PSYCH: alert & oriented  x 3 with fluent speech NEURO: no focal motor/sensory deficits   LABORATORY DATA:  I have reviewed the data as listed  . CBC Latest Ref Rng & Units 12/18/2017 10/28/2017 09/17/2017  WBC 3.9 - 10.3 K/uL 4.7 6.0 5.3  Hemoglobin 11.6 - 15.9 g/dL 14.8 14.3 15.1  Hematocrit 34.8 - 46.6 % 44.7 41.3 45.1  Platelets 145 - 400 K/uL 132(L) 138(L) 140(L)   . CBC    Component Value Date/Time   WBC 4.7 12/18/2017 0843   RBC 4.64 12/18/2017 0843   HGB 14.8 12/18/2017 0843   HGB 14.7 06/10/2017 0852   HGB 14.0 03/22/2017 0950   HCT 44.7 12/18/2017 0843   HCT 41.9 03/22/2017 0950   PLT 132 (L) 12/18/2017 0843   PLT 163 06/10/2017 0852   PLT 136 (L) 03/22/2017 0950   MCV 96.3 12/18/2017 0843   MCV 93.3 03/22/2017 0950   MCH 31.9 12/18/2017 0843   MCHC 33.1 12/18/2017 0843   RDW 13.5 12/18/2017 0843   RDW 15.0 (H) 03/22/2017 0950   LYMPHSABS 1.6 12/18/2017 0843   LYMPHSABS 1.5 03/22/2017 0950   MONOABS 0.3 12/18/2017 0843   MONOABS 0.3 03/22/2017 0950   EOSABS 0.1 12/18/2017 0843   EOSABS 0.1 03/22/2017 0950   BASOSABS 0.0 12/18/2017 0843   BASOSABS 0.0 03/22/2017 0950    . CMP Latest Ref Rng & Units 12/18/2017 10/28/2017  09/17/2017  Glucose 70 - 99 mg/dL 73 100(H) 92  BUN 8 - 23 mg/dL '14 16 17  '$ Creatinine 0.44 - 1.00 mg/dL 0.83 0.72 0.92  Sodium 135 - 145 mmol/L 143 135 139  Potassium 3.5 - 5.1 mmol/L 3.7 4.2 4.0  Chloride 98 - 111 mmol/L 106 102 104  CO2 22 - 32 mmol/L '26 22 25  '$ Calcium 8.9 - 10.3 mg/dL 9.8 9.4 9.6  Total Protein 6.5 - 8.1 g/dL 6.9 7.1 7.0  Total Bilirubin 0.3 - 1.2 mg/dL 0.7 1.0 0.8  Alkaline Phos 38 - 126 U/L 82 73 96  AST 15 - 41 U/L 14(L) 23 19  ALT 0 - 44 U/L '10 16 11   '$ . Lab Results  Component Value Date   LDH 179 12/18/2017        Hematopathology Order10/03/2017 [place] BIOPSY 01/25/2017: Interpretation Fourteen of the twenty metaphase cells analyzed are normal (46,XX). Six cells are abnormal and contain a deletion within the long arm of chromosome 7 and additional material of unknown origin attached to the long arm of chromosome 14 at or near the [facility] locus. Deletion of 7q is seen in mature B-cell neoplasms including marginal zone lymphoma. Ten cells were analyzed from the 24 hour unstimulated culture and ten cells were analyzed from the 72 hour culture stimulated with pokeweed mitogen, phorbol 12-myristate 13-acetate (PMA), and a CpG-oligodeoxynucleotide (ODN). The abnormal clone was only seen in the 72 hour stimulated culture.   Component Name Value Ref Range  Case Report Surgical Pathology Report Case: [id number]  Authorizing Provider:Rebecca Karie Kirks Sawchak,Collected: 01/25/2017 1100  AGNP  [facility]ing [facility]IONReceived:01/25/2017 [place] Pathologist: Trudee Kuster,  MD  Specimens: A) - Bone Marrow Right - Aspirate  B) - Bone Marrow Right - Biopsy  C) - Peripheral Blood   Final Diagnosis Bone marrow, right iliac, aspiration and biopsy -  Hypercellular bone marrow (70%) with involvement by mature B-cell lymphoma with non-specific immunophenotype (representing approximately 20% of marrow cellularity by immunohistochemical analysis) (See Comment)  -  Cytogenetic studies are  pending.     RADIOGRAPHIC STUDIES: I have personally reviewed the radiological images as listed and agreed with the findings in the report. No results found.  ASSESSMENT & PLAN:   67 year old female with   1) Splenic marginal zone lymphoma. Presented with splenomegaly and cytopenia with constitutional sypmtoms fatigue and night sweats - now resolved with Rituxan treatment. HIV, hepatitis C and hepatitis B serologies unrevealing  SPEP shows no M spike and negative IFE.  [facility] -  Hypercellular bone marrow (70%) with involvement by mature B-cell lymphoma with non-specific immunophenotype (representing approximately 20% of marrow cellularity by immunohistochemical analysis) Cytogenetics 7q deletion  2) Hot flashes grade 2  Patient declines consideration of medications for this.  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 Minimal thrombocytopenia at this time.  4) Rituxan related Rash - grade 1. Resolved and did not recur with dose 2 and 3 and 4 with adjusted premedications.  5) Left anterior lower chest wall and left upper abdomen discomfort -- likely muscle strain with persistent  discomfort. CT chest/abd showed no PE. No splenic infarction or other pathology.  6) left ovarian cyst - 4.2 cm . 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 CA-125 levels were 28.6 on the normal range of 0-35 . Alpha-fetoprotein was within normal limits and 1.8 . CEA level was 1.3 . 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 Patient notes that her sister had breast cancer in her 3s . Has not had genetic testing . No other family history of breast cancer ovarian cancer pancreatic cancer or melanoma . She has had recent left breast cysts that were aspirated in [place] and per her report were noted to be benign . Plan -yrly MMG with PCP  8) history of depression was previously on Lexapro currently not on any medications . Lost her husband 2009 to throat cancer .  PLAN: -avoid heavy lifting (has been lifting her grand child) -Stay physically active, drink lots of water -400 units Vitamin E a day -Discussed that SSRIs could be used to treat her hot flashes that have worsened since her b/l oophorectomy - declines this option. -Discussed pt labwork today, 12/18/17; blood counts are stable -Recommended that the pt continue to eat well, drink at least 48-64 oz of water each day, and walk 20-30 minutes each day.   -The pt shows no clinical or lab progression of SMZL at this time.  -No indication for further treatment at this time.   -Will see pt back in 3-4 months, sooner if any new concerns     RTC with Dr Irene Limbo in 4 months with labs     All of the patients questions were answered with apparent satisfaction. The patient knows to call the clinic with any problems, questions or concerns.  The total time spent in the appt was 25 minutes and more than 50% was on counseling and direct patient cares.    Sullivan Lone MD Joanne AAHIVMS [facility] Hematology/Oncology Physician [facility]  (Office):       [phone] (Work cell):  [phone] (Fax):           [phone]  I, Baldwin [place], am acting as a scribe for Dr. Irene Limbo  .I have reviewed the above documentation for accuracy and completeness, and I agree with  the above. Brunetta Genera MD

## 2017-12-18 ENCOUNTER — Telehealth: Payer: Self-pay

## 2017-12-18 ENCOUNTER — Inpatient Hospital Stay: Payer: Medicare HMO | Attending: Hematology

## 2017-12-18 ENCOUNTER — Encounter: Payer: Self-pay | Admitting: Hematology

## 2017-12-18 ENCOUNTER — Inpatient Hospital Stay (HOSPITAL_BASED_OUTPATIENT_CLINIC_OR_DEPARTMENT_OTHER): Payer: Medicare HMO | Admitting: Hematology

## 2017-12-18 VITALS — BP 118/64 | HR 52 | Temp 97.8°F | Resp 18 | Wt 131.4 lb

## 2017-12-18 DIAGNOSIS — N951 Menopausal and female climacteric states: Secondary | ICD-10-CM

## 2017-12-18 DIAGNOSIS — D696 Thrombocytopenia, unspecified: Secondary | ICD-10-CM | POA: Insufficient documentation

## 2017-12-18 DIAGNOSIS — C8307 Small cell B-cell lymphoma, spleen: Secondary | ICD-10-CM | POA: Insufficient documentation

## 2017-12-18 DIAGNOSIS — F329 Major depressive disorder, single episode, unspecified: Secondary | ICD-10-CM | POA: Diagnosis not present

## 2017-12-18 DIAGNOSIS — N83202 Unspecified ovarian cyst, left side: Secondary | ICD-10-CM | POA: Insufficient documentation

## 2017-12-18 DIAGNOSIS — Z79899 Other long term (current) drug therapy: Secondary | ICD-10-CM | POA: Insufficient documentation

## 2017-12-18 LAB — CBC WITH DIFFERENTIAL/PLATELET
Basophils Absolute: 0 10*3/uL (ref 0.0–0.1)
Basophils Relative: 0 %
Eosinophils Absolute: 0.1 10*3/uL (ref 0.0–0.5)
Eosinophils Relative: 3 %
HCT: 44.7 % (ref 34.8–46.6)
HEMOGLOBIN: 14.8 g/dL (ref 11.6–15.9)
LYMPHS ABS: 1.6 10*3/uL (ref 0.9–3.3)
Lymphocytes Relative: 34 %
MCH: 31.9 pg (ref 25.1–34.0)
MCHC: 33.1 g/dL (ref 31.5–36.0)
MCV: 96.3 fL (ref 79.5–101.0)
Monocytes Absolute: 0.3 10*3/uL (ref 0.1–0.9)
Monocytes Relative: 5 %
NEUTROS PCT: 58 %
Neutro Abs: 2.7 10*3/uL (ref 1.5–6.5)
Platelets: 132 10*3/uL — ABNORMAL LOW (ref 145–400)
RBC: 4.64 MIL/uL (ref 3.70–5.45)
RDW: 13.5 % (ref 11.2–14.5)
WBC: 4.7 10*3/uL (ref 3.9–10.3)

## 2017-12-18 LAB — CMP (CANCER CENTER ONLY)
ALK PHOS: 82 U/L (ref 38–126)
ALT: 10 U/L (ref 0–44)
AST: 14 U/L — ABNORMAL LOW (ref 15–41)
Albumin: 4.3 g/dL (ref 3.5–5.0)
Anion gap: 11 (ref 5–15)
BUN: 14 mg/dL (ref 8–23)
CO2: 26 mmol/L (ref 22–32)
Calcium: 9.8 mg/dL (ref 8.9–10.3)
Chloride: 106 mmol/L (ref 98–111)
Creatinine: 0.83 mg/dL (ref 0.44–1.00)
Glucose, Bld: 73 mg/dL (ref 70–99)
Potassium: 3.7 mmol/L (ref 3.5–5.1)
Sodium: 143 mmol/L (ref 135–145)
Total Bilirubin: 0.7 mg/dL (ref 0.3–1.2)
Total Protein: 6.9 g/dL (ref 6.5–8.1)

## 2017-12-18 LAB — LACTATE DEHYDROGENASE: LDH: 179 U/L (ref 98–192)

## 2017-12-18 NOTE — Telephone Encounter (Signed)
Printed avs and calender of upcoming appointment. Per 9/4 los 

## 2017-12-31 ENCOUNTER — Ambulatory Visit
Admission: RE | Admit: 2017-12-31 | Discharge: 2017-12-31 | Disposition: A | Discharge status: Home / Self Care | Payer: Medicare HMO | Source: Ambulatory Visit | Attending: Family Medicine | Admitting: Family Medicine

## 2017-12-31 DIAGNOSIS — Z1231 Encounter for screening mammogram for malignant neoplasm of breast: Secondary | ICD-10-CM | POA: Diagnosis not present

## 2018-01-17 DIAGNOSIS — Z0000 Encounter for general adult medical examination without abnormal findings: Secondary | ICD-10-CM | POA: Diagnosis not present

## 2018-01-17 DIAGNOSIS — E785 Hyperlipidemia, unspecified: Secondary | ICD-10-CM | POA: Diagnosis not present

## 2018-01-17 DIAGNOSIS — Z23 Encounter for immunization: Secondary | ICD-10-CM | POA: Diagnosis not present

## 2018-01-17 DIAGNOSIS — H9313 Tinnitus, bilateral: Secondary | ICD-10-CM | POA: Diagnosis not present

## 2018-01-17 DIAGNOSIS — H9190 Unspecified hearing loss, unspecified ear: Secondary | ICD-10-CM | POA: Diagnosis not present

## 2018-01-17 DIAGNOSIS — C8580 Other specified types of non-Hodgkin lymphoma, unspecified site: Secondary | ICD-10-CM | POA: Diagnosis not present

## 2018-01-22 DIAGNOSIS — E785 Hyperlipidemia, unspecified: Secondary | ICD-10-CM | POA: Diagnosis not present

## 2018-01-22 DIAGNOSIS — Z0000 Encounter for general adult medical examination without abnormal findings: Secondary | ICD-10-CM | POA: Diagnosis not present

## 2018-02-19 DIAGNOSIS — Z822 Family history of deafness and hearing loss: Secondary | ICD-10-CM | POA: Diagnosis not present

## 2018-02-19 DIAGNOSIS — H9313 Tinnitus, bilateral: Secondary | ICD-10-CM | POA: Diagnosis not present

## 2018-02-19 DIAGNOSIS — R42 Dizziness and giddiness: Secondary | ICD-10-CM | POA: Diagnosis not present

## 2018-02-19 DIAGNOSIS — H903 Sensorineural hearing loss, bilateral: Secondary | ICD-10-CM | POA: Diagnosis not present

## 2018-02-27 DIAGNOSIS — H40013 Open angle with borderline findings, low risk, bilateral: Secondary | ICD-10-CM | POA: Diagnosis not present

## 2018-02-27 DIAGNOSIS — H2513 Age-related nuclear cataract, bilateral: Secondary | ICD-10-CM | POA: Diagnosis not present

## 2018-04-18 ENCOUNTER — Telehealth: Payer: Self-pay | Admitting: *Deleted

## 2018-04-18 NOTE — Telephone Encounter (Signed)
Patient asked to have appts for 04/21/18 cancelled. She keeps her granddaughter and cannot come Monday AM because of that. She says any other day/date will work. Will send message to scheduling. Informed patient of this and she verbalized understanding.

## 2018-04-21 ENCOUNTER — Other Ambulatory Visit: Payer: Medicare HMO

## 2018-04-21 ENCOUNTER — Telehealth: Payer: Self-pay | Admitting: Hematology

## 2018-04-21 ENCOUNTER — Ambulatory Visit: Payer: Medicare HMO | Admitting: Hematology

## 2018-04-21 NOTE — Telephone Encounter (Signed)
Scheduled appt per 1/3 sch message - pt is aware of appt date and time - per patient request AM appts only

## 2018-05-05 NOTE — Progress Notes (Signed)
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 female who has been self referred to [place] for evaluation and management of newly noted splenomegaly.  Patient does not have many chronic medical comorbidities. 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 She also visited [place], [place], [place] and a few other places last 4 years.  A couple months ago while still in [place] she noticed some fullness in the upper and left upper abdomen and had an ultrasound which showed splenomegaly with the spleen length of 16.6 cm with no overt focal splenic lesions. No overt abdominal adenopathy noted. No free fluid. Liver, gallbladder, pancreas, both kidneys loss of bowel were noted to be within normal limits. There was incidental finding of a 4.2 cm thin-walled unilocular left ovarian cyst with no solid components. Normal uterus and right ovary was seen.  Her blood counts done on 09/02/2015 showed normal hemoglobin of 14 with an MCV of 91, WBC count of 5.9k with normal differential. Platelet clumping was noted with reported count of 103k. Malaria parasite smear was done and no other parasites were noted.  INTERVAL HISTORY:  Joanne Miller is here for management and evaluation of her Splenic marginal zone lymphoma. The patient's last visit with [place] was on 12/18/17. The pt reports that she is doing well overall.   The pt reports that she recently enjoyed her holidays very much. She notes that she has had some recent discomfort in her lower left abdomen, which she believes is attributable to her previous ovary removal. She notes that she continues to stay very active with regular  swimming and yoga. The pt denies fevers, chills, drenching night sweats and unexpected weight loss. She continues eating very well, spending time with family, endorses a good appetite and has gained a couple pounds.  Lab results today (05/06/18) of CBC w/diff is as follows: all values are WNL 05/06/18 CMP is wnl 05/06/18 LDH is wnl at 180  On review of systems, pt reports good appetite, eating well, staying very active, weight gain, good energy levels, and denies fevers, chills, drenching night sweats, unexpected weight loss, abdominal pain, leg swelling, and any other symptoms.   MEDICAL HISTORY:   #1 Dyslipidemia - not on any medications #2 GERD #3 hot flashes menopausal status was previously on [place]im[place] patch #4 history of sebaceous cysts. #5 history of recent left breast cysts - aspirated by ultrasound guidance and noted to be negative for cancer. #6 family history of breast cancer in her sister when she was in her 80s. No genetic testing done at that time.  SURGICAL HISTORY:  #1 breast cysts 2 aspiration on the left side . #2 root canal treatments #3 sebaceous cyst excision   SOCIAL HISTORY: Social History   Socioeconomic History  . Marital status: Widowed    Spouse name: Not on file  . Number of children: Not on file  . Years of education: Not on file  . Highest education level: Not on file  Occupational History  . Not on file  Social Needs  . Financial resource strain: Not on file  . Food insecurity:  Worry: Not on file    Inability: Not on file  . Transportation needs:    Medical: Not on file    Non-medical: Not on file  Tobacco Use  . Smoking status: Never Smoker  . Smokeless tobacco: Never Used  Substance and Sexual Activity  . Alcohol use: Yes    Alcohol/week: 1.0 standard drinks    Types: 1 Glasses of wine per week    Comment: occasional  . Drug use: No  . Sexual activity: Not on file  Lifestyle  . Physical activity:    Days per week: Not on file     Minutes per session: Not on file  . Stress: Not on file  Relationships  . Social connections:    Talks on phone: Not on file    Gets together: Not on file    Attends religious service: Not on file    Active member of club or organization: Not on file    Attends meetings of clubs or organizations: Not on file    Relationship status: Not on file  . Intimate partner violence:    Fear of current or ex partner: Not on file    Emotionally abused: Not on file    Physically abused: Not on file    Forced sexual activity: Not on file  Other Topics Concern  . Not on file  Social History Narrative  . Not on file    FAMILY HISTORY: Family History  Problem Relation Age of Onset  . Breast cancer Sister        early 59's  . Breast cancer Maternal Aunt        diagnosed in her 69's    ALLERGIES:  is allergic to codeine.  MEDICATIONS:  Current Outpatient Medications  Medication Sig Dispense Refill  . aspirin 81 MG EC tablet Take 81 mg by mouth daily. Swallow whole.    . docusate sodium (COLACE) 100 MG capsule Take 1 capsule (100 mg total) by mouth 2 (two) times daily. (Patient not taking: Reported on 12/18/2017) 30 capsule 0  . ibuprofen (ADVIL,MOTRIN) 600 MG tablet Take 1 tablet (600 mg total) by mouth every 6 (six) hours as needed. (Patient not taking: Reported on 12/18/2017) 30 tablet 0  . Misc Natural Products (TART CHERRY ADVANCED PO) Take by mouth.    . Omega-3 Fatty Acids (FISH OIL) 1200 MG CAPS Take by mouth.    . ondansetron (ZOFRAN) 4 MG tablet Take 1 tablet (4 mg total) by mouth every 8 (eight) hours as needed for nausea or vomiting. (Patient not taking: Reported on 12/18/2017) 20 tablet 0  . vitamin E 100 UNIT capsule Take by mouth daily.     No current facility-administered medications for this visit.     REVIEW OF SYSTEMS:    A 10+ POINT REVIEW OF SYSTEMS WAS OBTAINED including neurology, dermatology, psychiatry, cardiac, respiratory, lymph, extremities, GI, GU, Musculoskeletal,  constitutional, breasts, reproductive, HEENT.  All pertinent positives are noted in the HPI.  All others are negative.   PHYSICAL EXAMINATION: ECOG PERFORMANCE STATUS: 0 - Asymptomatic  GENERAL:alert, in no acute distress and comfortable SKIN: no acute rashes, no significant lesions EYES: conjunctiva are pink and non-injected, sclera anicteric OROPHARYNX: MMM, no exudates, no oropharyngeal erythema or ulceration NECK: supple, no JVD LYMPH:  no palpable lymphadenopathy in the cervical, axillary or inguinal regions LUNGS: clear to auscultation b/l with normal respiratory effort HEART: regular rate & rhythm ABDOMEN:  normoactive bowel sounds , non tender, not distended. No palpable  hepatosplenomegaly.  Extremity: no pedal edema PSYCH: alert & oriented x 3 with fluent speech NEURO: no focal motor/sensory deficits   LABORATORY DATA:  I have reviewed the data as listed  . CBC Latest Ref Rng & Units 05/06/2018 12/18/2017 10/28/2017  WBC 4.0 - 10.5 K/uL 4.6 4.7 6.0  Hemoglobin 12.0 - 15.0 g/dL 14.0 14.8 14.3  Hematocrit 36.0 - 46.0 % 41.5 44.7 41.3  Platelets 150 - 400 K/uL 159 132(L) 138(L)   . CBC    Component Value Date/Time   WBC 4.6 05/06/2018 0844   RBC 4.44 05/06/2018 0844   HGB 14.0 05/06/2018 0844   HGB 14.7 06/10/2017 0852   HGB 14.0 03/22/2017 0950   HCT 41.5 05/06/2018 0844   HCT 41.9 03/22/2017 0950   PLT 159 05/06/2018 0844   PLT 163 06/10/2017 0852   PLT 136 (L) 03/22/2017 0950   MCV 93.5 05/06/2018 0844   MCV 93.3 03/22/2017 0950   MCH 31.5 05/06/2018 0844   MCHC 33.7 05/06/2018 0844   RDW 13.0 05/06/2018 0844   RDW 15.0 (H) 03/22/2017 0950   LYMPHSABS 1.4 05/06/2018 0844   LYMPHSABS 1.5 03/22/2017 0950   MONOABS 0.3 05/06/2018 0844   MONOABS 0.3 03/22/2017 0950   EOSABS 0.1 05/06/2018 0844   EOSABS 0.1 03/22/2017 0950   BASOSABS 0.0 05/06/2018 0844   BASOSABS 0.0 03/22/2017 0950    . CMP Latest Ref Rng & Units 05/06/2018 12/18/2017 10/28/2017  Glucose 70 -  99 mg/dL 92 73 100(H)  BUN 8 - 23 mg/dL _0 Creatinine 0.44 - 1.00 mg/dL 0.76 0.83 0.72  Sodium 135 - 145 mmol/L 140 143 135  Potassium 3.5 - 5.1 mmol/L 3.9 3.7 4.2  Chloride 98 - 111 mmol/L 107 106 102  CO2 22 - 32 mmol/L _1 Calcium 8.9 - 10.3 mg/dL 9.0 9.8 9.4  Total Protein 6.5 - 8.1 g/dL 6.3(L) 6.9 7.1  Total Bilirubin 0.3 - 1.2 mg/dL 0.7 0.7 1.0  Alkaline Phos 38 - 126 U/L 86 82 73  AST 15 - 41 U/L 15 14(L) 23  ALT 0 - 44 U/L _2 . Lab Results  Component Value Date   LDH 180 05/06/2018        Hematopathology Order10/03/2017 [place]  BONE MARROW BIOPSY 01/25/2017: Interpretation Fourteen of the twenty metaphase cells analyzed are normal (46,XX). Six cells are abnormal and contain a deletion within the long arm of chromosome 7 and additional material of unknown origin attached to the long arm of chromosome 14 at or near the [facility] locus. Deletion of 7q is seen in mature B-cell neoplasms including marginal zone lymphoma. Ten cells were analyzed from the 24 hour unstimulated culture and ten cells were analyzed from the 72 hour culture stimulated with pokeweed mitogen, phorbol 12-myristate 13-acetate (PMA), and a CpG-oligodeoxynucleotide (ODN). The abnormal clone was only seen in the 72 hour stimulated culture.   Component Name Value Ref Range  Case Report Surgical Pathology Report Case: [id number]  Authorizing Provider:Rebecca Karie Kirks Sawchak,Collected: 01/25/2017 1100  AGNP  [facility]ing [facility]IONReceived:01/25/2017 [place] Pathologist: Trudee Kuster,  MD  Specimens: A) - Bone Marrow Right - Aspirate  B) - Bone Marrow Right - Biopsy  C) - Peripheral Blood   Final Diagnosis Bone marrow, right iliac, aspiration and biopsy -  Hypercellular bone marrow (70%) with involvement by mature B-cell lymphoma with non-specific immunophenotype (representing approximately 20% of marrow cellularity by immunohistochemical  analysis) (See Comment)  -  Cytogenetic studies are pending.     RADIOGRAPHIC STUDIES: I have personally reviewed the radiological images as listed and agreed with the findings in the report. No results found.  ASSESSMENT & PLAN:   67 y.o. female with   1) Splenic marginal zone lymphoma. Presented with splenomegaly and cytopenia with constitutional sypmtoms fatigue and night sweats - now resolved with Rituxan treatment. HIV, hepatitis C and hepatitis B serologies unrevealing  SPEP shows no M spike and negative IFE.  BM Bx done [facility] -  Hypercellular bone marrow (70%) with involvement by mature B-cell lymphoma with non-specific immunophenotype (representing approximately 20% of marrow cellularity by immunohistochemical analysis) Cytogenetics 7q deletion  2) Hot flashes grade 2  Patient declines consideration of medications for this.  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 Now resolved  4) Rituxan related Rash - grade 1. Resolved and did not recur with dose 2 and 3 and 4 with adjusted premedications.  5) left ovarian cyst - 4.2 cm . 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 CA-125 levels were 28.6 on the normal range of 0-35 .  Alpha-fetoprotein was within normal limits and 1.8 . CEA level was 1.3 . 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 Patient notes that her sister had breast cancer in her 75s . Has not had genetic testing . No other family history of breast cancer ovarian cancer pancreatic cancer or melanoma . She has had recent left breast cysts that were aspirated in [place] and per her report were noted to be benign . Plan -yrly MMG with PCP (last done 12/31/2017) - negative   8) history of depression was previously on Lexapro currently not on any medications . Lost her husband 2009 to throat cancer .  PLAN: -Discussed pt labwork today, 05/06/18; blood counts are all normal -The pt shows no clinical or lab progression of SMZL at this time.  -No indication for further treatment at this time.   -400 units Vitamin E a day for hot flashes -Discussed that SSRIs could be used to treat her hot flashes that have worsened since her b/l oophorectomy - declines this option. -Recommended that the pt continue to eat well, drink at least 48-64 oz of water each day, and walk 20-30 minutes each day. -Will see the pt back in 4 months with labs   RTC with dr Irene Limbo with labs in 4 months    All of the patients questions were answered with apparent satisfaction. The patient knows to call the clinic with any problems, questions or concerns.  The total time spent in the appt was 20 minutes and more than 50% was on counseling and direct patient cares.   Sullivan Lone MD [place] AAHIVMS [facility] Hematology/Oncology Physician [facility]  (Office):       [phone] (Work cell):  [phone] (Fax):           [phone]  I, Baldwin [place], am acting as a scribe for Dr. Sullivan Lone.   .I have reviewed the above documentation for accuracy and completeness, and I agree with the  above. Brunetta Genera MD

## 2018-05-06 ENCOUNTER — Telehealth: Payer: Self-pay | Admitting: Hematology

## 2018-05-06 ENCOUNTER — Inpatient Hospital Stay (HOSPITAL_BASED_OUTPATIENT_CLINIC_OR_DEPARTMENT_OTHER): Payer: Medicare HMO | Admitting: Hematology

## 2018-05-06 ENCOUNTER — Inpatient Hospital Stay: Payer: Medicare HMO | Attending: Hematology

## 2018-05-06 VITALS — BP 134/41 | HR 52 | Temp 97.9°F | Resp 18 | Ht 63.0 in | Wt 133.4 lb

## 2018-05-06 DIAGNOSIS — Z7982 Long term (current) use of aspirin: Secondary | ICD-10-CM | POA: Diagnosis not present

## 2018-05-06 DIAGNOSIS — K219 Gastro-esophageal reflux disease without esophagitis: Secondary | ICD-10-CM | POA: Diagnosis not present

## 2018-05-06 DIAGNOSIS — E785 Hyperlipidemia, unspecified: Secondary | ICD-10-CM | POA: Diagnosis not present

## 2018-05-06 DIAGNOSIS — N83202 Unspecified ovarian cyst, left side: Secondary | ICD-10-CM | POA: Diagnosis not present

## 2018-05-06 DIAGNOSIS — Z79899 Other long term (current) drug therapy: Secondary | ICD-10-CM | POA: Diagnosis not present

## 2018-05-06 DIAGNOSIS — D696 Thrombocytopenia, unspecified: Secondary | ICD-10-CM | POA: Diagnosis not present

## 2018-05-06 DIAGNOSIS — Z803 Family history of malignant neoplasm of breast: Secondary | ICD-10-CM

## 2018-05-06 DIAGNOSIS — C8307 Small cell B-cell lymphoma, spleen: Secondary | ICD-10-CM | POA: Insufficient documentation

## 2018-05-06 LAB — CBC WITH DIFFERENTIAL/PLATELET
ABS IMMATURE GRANULOCYTES: 0.01 10*3/uL (ref 0.00–0.07)
BASOS ABS: 0 10*3/uL (ref 0.0–0.1)
Basophils Relative: 0 %
Eosinophils Absolute: 0.1 10*3/uL (ref 0.0–0.5)
Eosinophils Relative: 2 %
HEMATOCRIT: 41.5 % (ref 36.0–46.0)
HEMOGLOBIN: 14 g/dL (ref 12.0–15.0)
IMMATURE GRANULOCYTES: 0 %
LYMPHS ABS: 1.4 10*3/uL (ref 0.7–4.0)
LYMPHS PCT: 31 %
MCH: 31.5 pg (ref 26.0–34.0)
MCHC: 33.7 g/dL (ref 30.0–36.0)
MCV: 93.5 fL (ref 80.0–100.0)
Monocytes Absolute: 0.3 10*3/uL (ref 0.1–1.0)
Monocytes Relative: 7 %
NEUTROS ABS: 2.7 10*3/uL (ref 1.7–7.7)
NEUTROS PCT: 60 %
NRBC: 0 % (ref 0.0–0.2)
Platelets: 159 10*3/uL (ref 150–400)
RBC: 4.44 MIL/uL (ref 3.87–5.11)
RDW: 13 % (ref 11.5–15.5)
WBC: 4.6 10*3/uL (ref 4.0–10.5)

## 2018-05-06 LAB — CMP (CANCER CENTER ONLY)
ALT: 14 U/L (ref 0–44)
ANION GAP: 9 (ref 5–15)
AST: 15 U/L (ref 15–41)
Albumin: 4.1 g/dL (ref 3.5–5.0)
Alkaline Phosphatase: 86 U/L (ref 38–126)
BUN: 12 mg/dL (ref 8–23)
CHLORIDE: 107 mmol/L (ref 98–111)
CO2: 24 mmol/L (ref 22–32)
Calcium: 9 mg/dL (ref 8.9–10.3)
Creatinine: 0.76 mg/dL (ref 0.44–1.00)
GFR, Estimated: >60 mL/min (ref >60)
Glucose, Bld: 92 mg/dL (ref 70–99)
Potassium: 3.9 mmol/L (ref 3.5–5.1)
SODIUM: 140 mmol/L (ref 135–145)
Total Bilirubin: 0.7 mg/dL (ref 0.3–1.2)
Total Protein: 6.3 g/dL — ABNORMAL LOW (ref 6.5–8.1)

## 2018-05-06 LAB — LACTATE DEHYDROGENASE: LDH: 180 U/L (ref 98–192)

## 2018-05-06 NOTE — Telephone Encounter (Signed)
Scheduled appt per 1/21 los - gave patient AVS and calender per los.   

## 2018-05-06 NOTE — Patient Instructions (Signed)
Thank you for choosing [facility] to provide your oncology and hematology care.  To afford each patient quality time with our providers, please arrive 30 minutes before your scheduled appointment time.  If you arrive late for your appointment, you may be asked to reschedule.  We strive to give you quality time with our providers, and arriving late affects you and other patients whose appointments are after yours.    If you are a no show for multiple scheduled visits, you may be dismissed from the clinic at the providers discretion.     Again, thank you for choosing [facility], our hope is that these requests will decrease the amount of time that you wait before being seen by our physicians.  ______________________________________________________________________   Should you have questions after your visit to the [facility], please contact our office at [phone] between the hours of 8:30 and 4:30 p.m.    Voicemails left after 4:30p.m will not be returned until the following business day.     For prescription refill requests, please have your pharmacy contact us directly.  Please also try to allow 48 hours for prescription requests.     Please contact the scheduling department for questions regarding scheduling.  For scheduling of procedures such as PET scans, CT scans, MRI, Ultrasound, etc please contact central scheduling at [phone].     Resources For Cancer Patients and Caregivers:    Oncolink.org:  A wonderful resource for patients and healthcare providers for information regarding your disease, ways to tract your treatment, what to expect, etc.      American Cancer Society:  800-227-2345  Can help patients locate various types of support and financial assistance   Cancer Care: 1-800-813-HOPE (4673) Provides financial assistance, online support groups, medication/co-pay assistance.     [place] DSS:  336-641-3447 Where to apply  for food stamps, Medicaid, and utility assistance   Medicare Rights Center: 800-333-4114 Helps people with Medicare understand their rights and benefits, navigate the Medicare system, and secure the quality healthcare they deserve   SCAT: 336-333-6589 [place] [employer]'s shared-ride transportation service for eligible riders who have a disability that prevents them from riding the fixed route bus.     For additional information on assistance programs please contact our social worker:   Joanne Miller:  [phone]  

## 2018-05-07 ENCOUNTER — Ambulatory Visit: Payer: Self-pay | Admitting: Hematology

## 2018-05-07 ENCOUNTER — Other Ambulatory Visit: Payer: Self-pay

## 2018-05-07 ENCOUNTER — Telehealth: Payer: Self-pay | Admitting: *Deleted

## 2018-05-07 NOTE — Telephone Encounter (Signed)
Contacted patient at Dr. Grier Mitts request with following information: Labs from clinic visit are within normal limits, return in   4 months as planned. Encouraged to contact office for questions or concerns. Patient verbalized understanding and thanked this caller.

## 2018-05-12 ENCOUNTER — Other Ambulatory Visit: Payer: Self-pay | Admitting: Nurse Practitioner

## 2018-06-04 IMAGING — CT CT ABDOMEN W/ CM
2 of 11 series · 13 of 46 positions shown, 18 images · IV contrast (ISOVUE 370)
Comparison: CT abdomen/ pelvis dated 11/30/2016

CLINICAL DATA: Left lower chest pain, evaluate for PE. History of
splenic lymphoma with chemotherapy ongoing, evaluate for splenic
injury/infarct.

EXAM:
CT ANGIOGRAPHY CHEST
CT ABDOMEN WITH CONTRAST
TECHNIQUE: Multidetector CT imaging of the chest was performed using the
standard protocol during bolus administration of intravenous
contrast. Multiplanar CT image reconstructions and MIPs were
obtained to evaluate the vascular anatomy. Multidetector CT imaging
of the abdomen was performed using the standard protocol during
bolus administration of intravenous contrast.
CONTRAST:  100mL [id number]-MGF IOPAMIDOL ([id number]) INJECTION 76%

[Series 5: thins · axial · 0.61mm/px · z∈[+1550,+1829]mm · 12 of 315 slices shown, 16 images]
[im 18/315  soft-tissue]
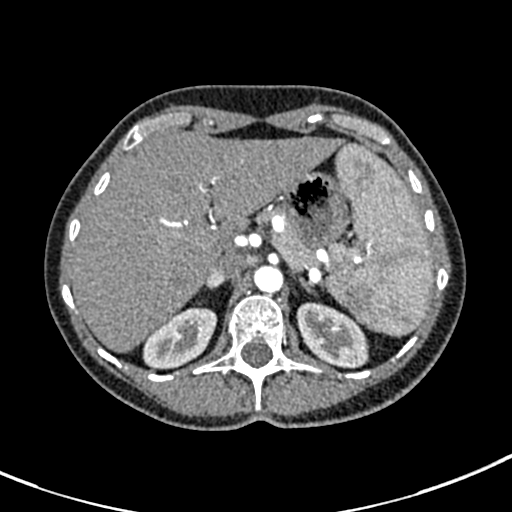
[im 18/315  bone]
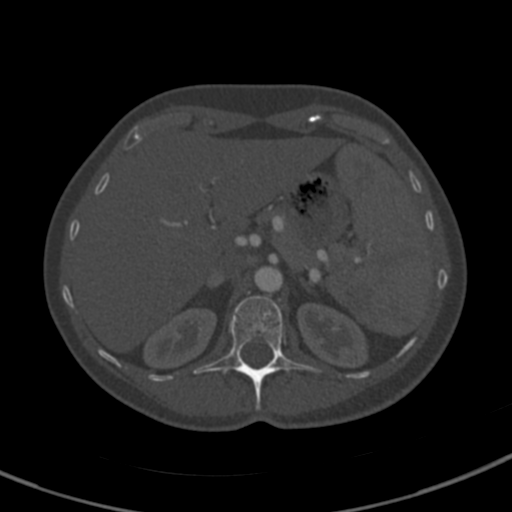
[im 53/315  soft-tissue]
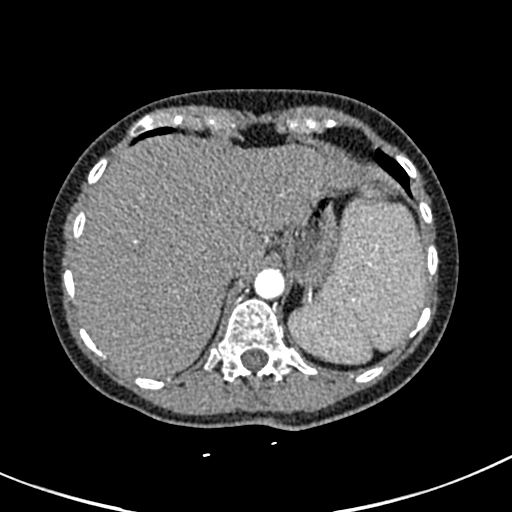
[im 88/315  soft-tissue]
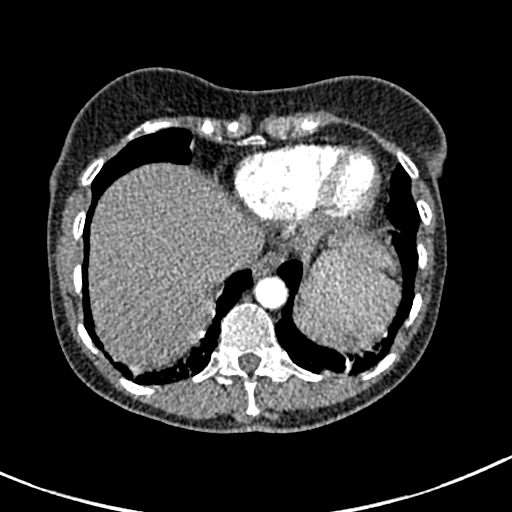
[im 105/315  soft-tissue]
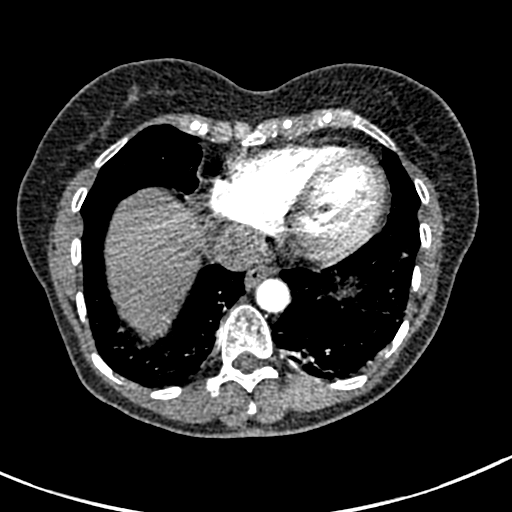
[im 140/315  soft-tissue]
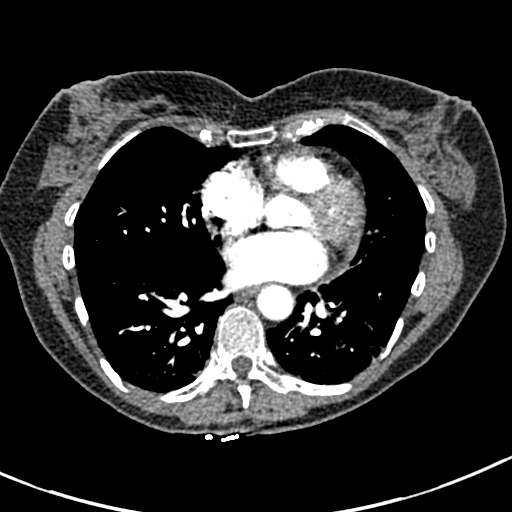
[im 175/315  soft-tissue]
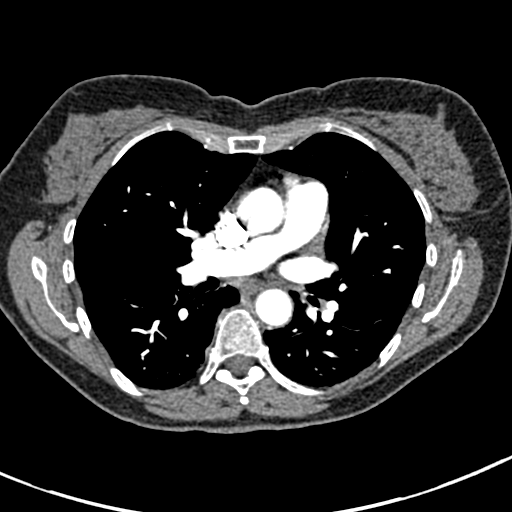
[im 210/315  soft-tissue]
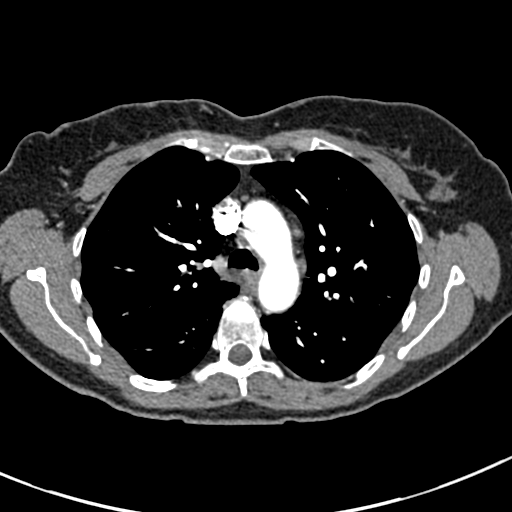
[im 227/315  soft-tissue]
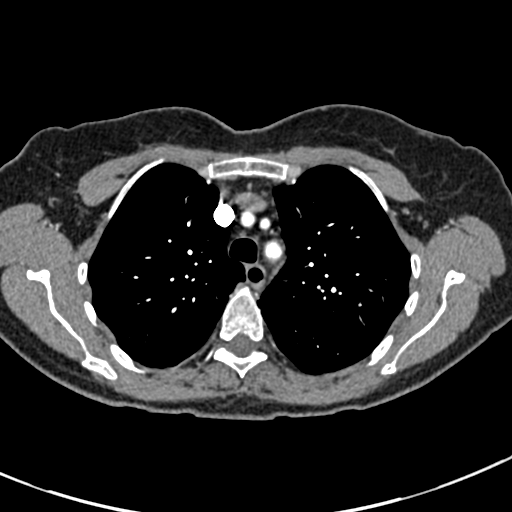
[im 245/315  lung]
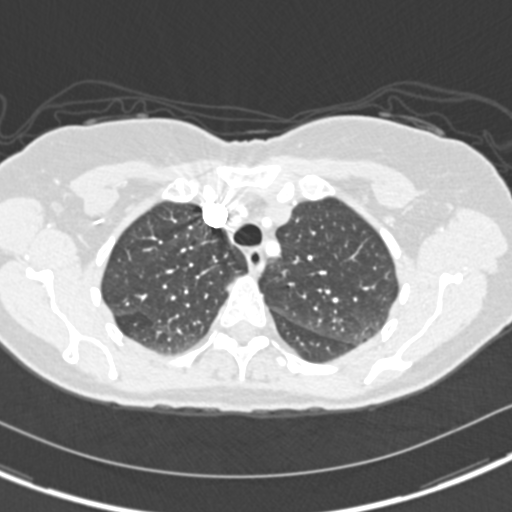
[im 262/315  soft-tissue]
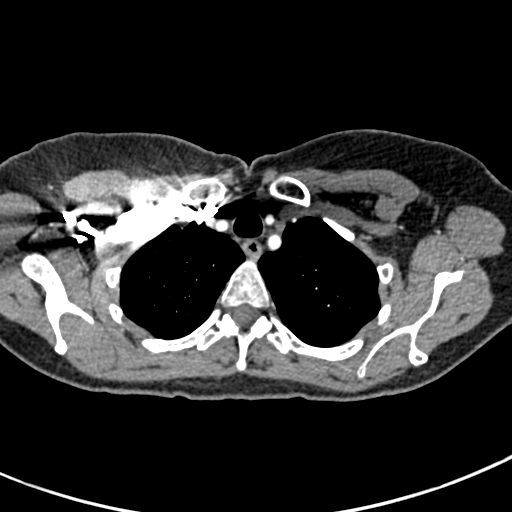
[im 262/315  lung]
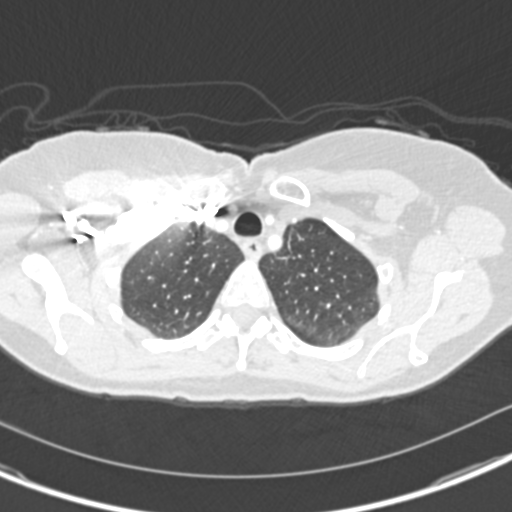
[im 262/315  bone]
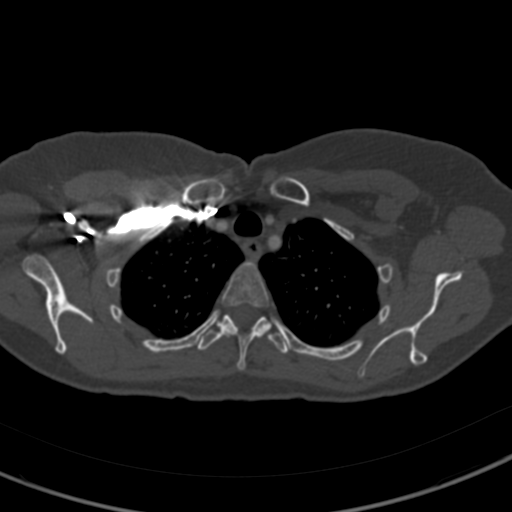
[im 280/315  lung]
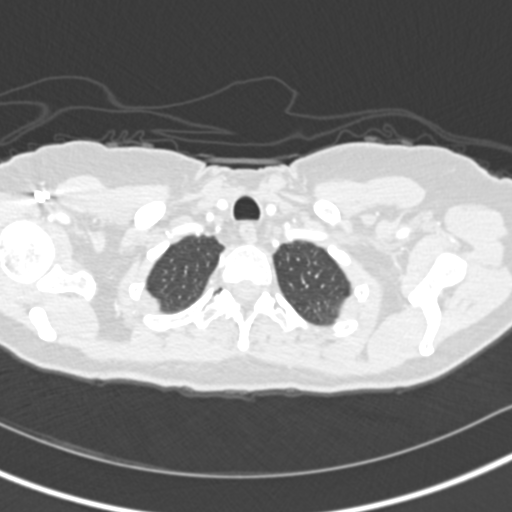
[im 297/315  soft-tissue]
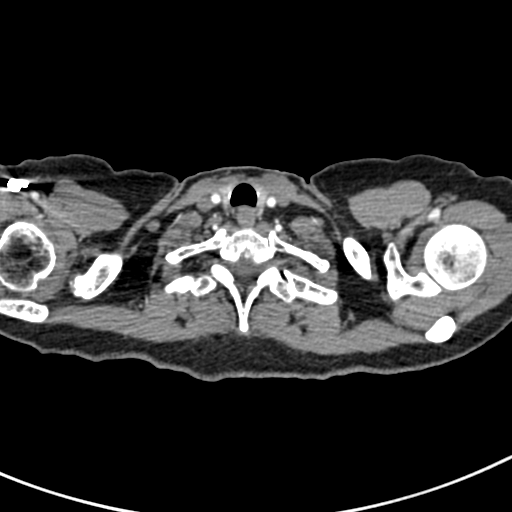
[im 297/315  lung]
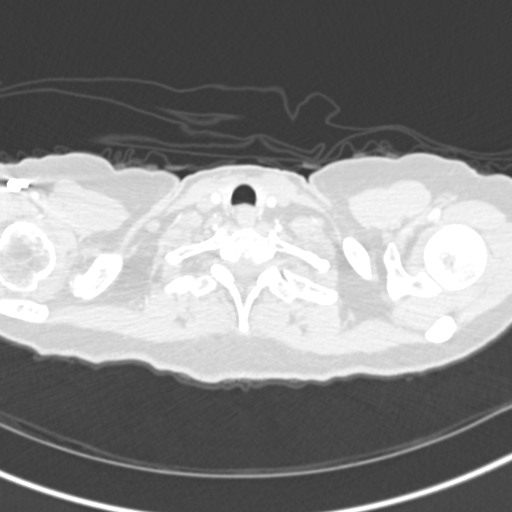

[Series 10: coronal mpr · coronal · 0.57mm/px · 1 of 118 slices shown, 2 images]
[im 59/118  soft-tissue]
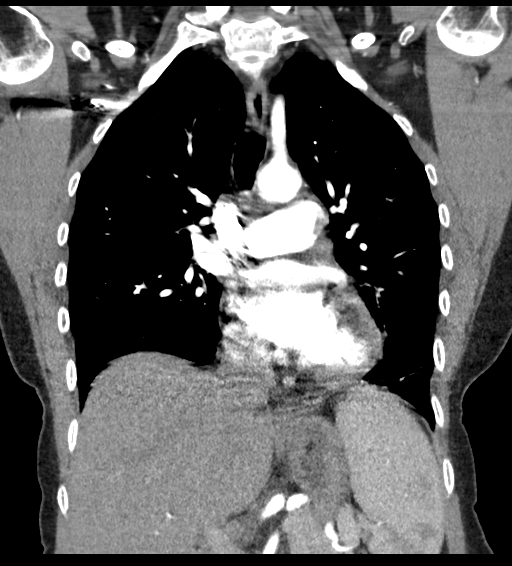
[im 59/118  bone]
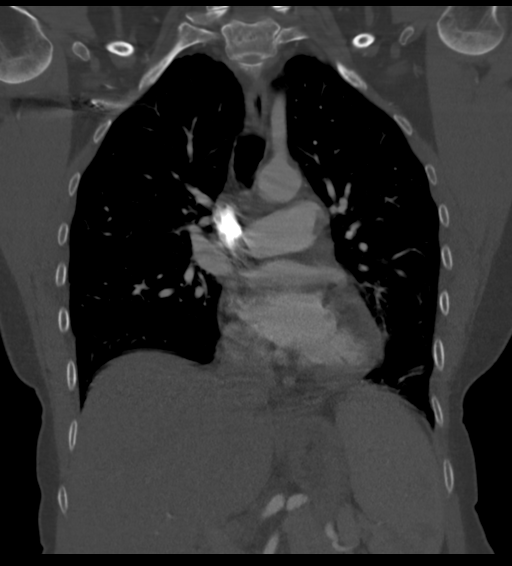

[13 of 46 positions shown; findings below may reference images not displayed]

FINDINGS: CTA CHEST FINDINGS

Cardiovascular: Satisfactory opacification the bilateral pulmonary
artery to the segmental level. No evidence of pulmonary embolism.

No evidence of thoracic aortic aneurysm or dissection.

The heart is normal in size.  No pericardial effusion.

Mediastinum/Nodes: No suspicious mediastinal lymphadenopathy.

Visualized thyroid is unremarkable.

Lungs/Pleura: Lungs are essentially clear.

Very mild dependent atelectasis in the bilateral upper and lower
lobes.

No focal consolidation.

No pleural effusion or pneumothorax.

Musculoskeletal: Visualized osseous structures are within normal
limits.

Review of the MIP images confirms the above findings.

CT ABDOMEN FINDINGS

Hepatobiliary: Small probable hepatic cysts measuring up to 9 mm in
segment 6 (series 7/image 20).

Gallbladder is underdistended but unremarkable. No intrahepatic or
extrahepatic ductal dilatation.

Pancreas: Within normal limits.

Spleen: At the upper limits of normal for size. No evidence of
splenic infarct.

Adrenals/Urinary Tract: Adrenal glands are within normal limits.

Tiny left renal cysts measuring up to 4 mm in the left lower pole
(series 7/ image 33). Right kidney is within normal limits. No
hydronephrosis.

Stomach/Bowel: Stomach is within normal limits.

Visualized bowel is grossly unremarkable.

Vascular/Lymphatic: No evidence of abdominal aortic aneurysm.

No suspicious abdominal lymphadenopathy.

Other: No abdominal ascites.

Musculoskeletal: Mild degenerative changes at L2-3.

Review of the MIP images confirms the above findings.
IMPRESSION: No evidence of pulmonary embolism.

No evidence of acute cardiopulmonary disease.

No evidence of splenic infarct.

Unremarkable CT abdomen. Please note that the pelvis was not imaged.

## 2018-06-06 DIAGNOSIS — G43819 Other migraine, intractable, without status migrainosus: Secondary | ICD-10-CM | POA: Diagnosis not present

## 2018-06-06 DIAGNOSIS — H43393 Other vitreous opacities, bilateral: Secondary | ICD-10-CM | POA: Diagnosis not present

## 2018-06-06 DIAGNOSIS — H2513 Age-related nuclear cataract, bilateral: Secondary | ICD-10-CM | POA: Diagnosis not present

## 2018-06-06 DIAGNOSIS — H04123 Dry eye syndrome of bilateral lacrimal glands: Secondary | ICD-10-CM | POA: Diagnosis not present

## 2018-06-26 DIAGNOSIS — Z803 Family history of malignant neoplasm of breast: Secondary | ICD-10-CM | POA: Diagnosis not present

## 2018-06-26 DIAGNOSIS — Z8249 Family history of ischemic heart disease and other diseases of the circulatory system: Secondary | ICD-10-CM | POA: Diagnosis not present

## 2018-06-26 DIAGNOSIS — I499 Cardiac arrhythmia, unspecified: Secondary | ICD-10-CM | POA: Diagnosis not present

## 2018-09-04 ENCOUNTER — Inpatient Hospital Stay: Payer: Medicare HMO | Admitting: Hematology

## 2018-09-04 ENCOUNTER — Inpatient Hospital Stay: Payer: Medicare HMO

## 2018-11-21 ENCOUNTER — Other Ambulatory Visit: Payer: Self-pay | Admitting: Family Medicine

## 2018-11-21 DIAGNOSIS — Z1231 Encounter for screening mammogram for malignant neoplasm of breast: Secondary | ICD-10-CM

## 2018-12-02 DIAGNOSIS — R69 Illness, unspecified: Secondary | ICD-10-CM | POA: Diagnosis not present

## 2019-01-13 ENCOUNTER — Other Ambulatory Visit: Payer: Self-pay

## 2019-01-13 ENCOUNTER — Ambulatory Visit
Admission: RE | Admit: 2019-01-13 | Discharge: 2019-01-13 | Disposition: A | Discharge status: Home / Self Care | Payer: Medicare HMO | Source: Ambulatory Visit | Attending: Family Medicine | Admitting: Family Medicine

## 2019-01-13 DIAGNOSIS — Z1231 Encounter for screening mammogram for malignant neoplasm of breast: Secondary | ICD-10-CM

## 2019-01-14 ENCOUNTER — Other Ambulatory Visit: Payer: Self-pay | Admitting: Family Medicine

## 2019-01-14 DIAGNOSIS — R928 Other abnormal and inconclusive findings on diagnostic imaging of breast: Secondary | ICD-10-CM

## 2019-01-15 ENCOUNTER — Other Ambulatory Visit: Payer: Self-pay

## 2019-01-15 ENCOUNTER — Ambulatory Visit
Admission: RE | Admit: 2019-01-15 | Discharge: 2019-01-15 | Disposition: A | Discharge status: Home / Self Care | Payer: Medicare HMO | Source: Ambulatory Visit | Attending: Family Medicine | Admitting: Family Medicine

## 2019-01-15 DIAGNOSIS — R928 Other abnormal and inconclusive findings on diagnostic imaging of breast: Secondary | ICD-10-CM

## 2019-01-15 DIAGNOSIS — N6002 Solitary cyst of left breast: Secondary | ICD-10-CM | POA: Diagnosis not present

## 2019-01-22 ENCOUNTER — Other Ambulatory Visit: Payer: Medicare HMO

## 2019-02-11 DIAGNOSIS — E785 Hyperlipidemia, unspecified: Secondary | ICD-10-CM | POA: Diagnosis not present

## 2019-02-11 DIAGNOSIS — C8580 Other specified types of non-Hodgkin lymphoma, unspecified site: Secondary | ICD-10-CM | POA: Diagnosis not present

## 2019-02-11 DIAGNOSIS — Z0000 Encounter for general adult medical examination without abnormal findings: Secondary | ICD-10-CM | POA: Diagnosis not present

## 2019-02-24 DIAGNOSIS — Z0000 Encounter for general adult medical examination without abnormal findings: Secondary | ICD-10-CM | POA: Diagnosis not present

## 2019-02-24 DIAGNOSIS — Z23 Encounter for immunization: Secondary | ICD-10-CM | POA: Diagnosis not present

## 2019-02-24 DIAGNOSIS — E785 Hyperlipidemia, unspecified: Secondary | ICD-10-CM | POA: Diagnosis not present

## 2019-08-13 ENCOUNTER — Telehealth: Payer: Self-pay | Admitting: Hematology

## 2019-08-13 NOTE — Telephone Encounter (Signed)
Scheduled appt per pt request-   4/26 sch message . Unable to reach pt . Left message with appt date and time

## 2019-08-21 ENCOUNTER — Other Ambulatory Visit: Payer: Medicare HMO

## 2019-08-21 ENCOUNTER — Ambulatory Visit: Payer: Medicare HMO | Admitting: Hematology

## 2019-08-31 ENCOUNTER — Telehealth: Payer: Self-pay | Admitting: Hematology

## 2019-08-31 NOTE — Telephone Encounter (Signed)
Called pt per 5/17 sch message - no answer . Left message for patient to call back to reschedule appt.

## 2019-09-11 ENCOUNTER — Ambulatory Visit: Payer: Medicare HMO | Admitting: Hematology

## 2019-09-11 ENCOUNTER — Other Ambulatory Visit: Payer: Medicare HMO

## 2019-09-22 ENCOUNTER — Inpatient Hospital Stay: Payer: Medicare HMO | Admitting: Hematology

## 2019-09-22 ENCOUNTER — Other Ambulatory Visit: Payer: Self-pay

## 2019-09-22 ENCOUNTER — Other Ambulatory Visit: Payer: Self-pay | Admitting: *Deleted

## 2019-09-22 ENCOUNTER — Inpatient Hospital Stay: Payer: Medicare HMO | Attending: Hematology

## 2019-09-22 VITALS — BP 110/55 | HR 54 | Temp 97.7°F | Resp 16 | Wt 134.6 lb

## 2019-09-22 DIAGNOSIS — C884 Extranodal marginal zone B-cell lymphoma of mucosa-associated lymphoid tissue [MALT-lymphoma]: Secondary | ICD-10-CM | POA: Insufficient documentation

## 2019-09-22 DIAGNOSIS — N951 Menopausal and female climacteric states: Secondary | ICD-10-CM

## 2019-09-22 DIAGNOSIS — C8307 Small cell B-cell lymphoma, spleen: Secondary | ICD-10-CM

## 2019-09-22 DIAGNOSIS — D696 Thrombocytopenia, unspecified: Secondary | ICD-10-CM | POA: Insufficient documentation

## 2019-09-22 DIAGNOSIS — R161 Splenomegaly, not elsewhere classified: Secondary | ICD-10-CM | POA: Insufficient documentation

## 2019-09-22 DIAGNOSIS — Z803 Family history of malignant neoplasm of breast: Secondary | ICD-10-CM | POA: Insufficient documentation

## 2019-09-22 LAB — RETICULOCYTES
Immature Retic Fract: 4.8 % (ref 2.3–15.9)
RBC.: 4.56 MIL/uL (ref 3.87–5.11)
Retic Count, Absolute: 67.9 10*3/uL (ref 19.0–186.0)
Retic Ct Pct: 1.5 % (ref 0.4–3.1)

## 2019-09-22 LAB — CMP (CANCER CENTER ONLY)
ALT: 28 U/L (ref 0–44)
AST: 20 U/L (ref 15–41)
Albumin: 4.1 g/dL (ref 3.5–5.0)
Alkaline Phosphatase: 84 U/L (ref 38–126)
Anion gap: 11 (ref 5–15)
BUN: 12 mg/dL (ref 8–23)
CO2: 23 mmol/L (ref 22–32)
Calcium: 9.4 mg/dL (ref 8.9–10.3)
Chloride: 108 mmol/L (ref 98–111)
Creatinine: 0.79 mg/dL (ref 0.44–1.00)
GFR, Est AFR Am: >60 mL/min (ref >60)
GFR, Estimated: >60 mL/min (ref >60)
Glucose, Bld: 78 mg/dL (ref 70–99)
Potassium: 4.4 mmol/L (ref 3.5–5.1)
Sodium: 142 mmol/L (ref 135–145)
Total Bilirubin: 0.6 mg/dL (ref 0.3–1.2)
Total Protein: 6.5 g/dL (ref 6.5–8.1)

## 2019-09-22 LAB — CBC WITH DIFFERENTIAL (CANCER CENTER ONLY)
Abs Immature Granulocytes: 0.01 10*3/uL (ref 0.00–0.07)
Basophils Absolute: 0 10*3/uL (ref 0.0–0.1)
Basophils Relative: 0 %
Eosinophils Absolute: 0.1 10*3/uL (ref 0.0–0.5)
Eosinophils Relative: 2 %
HCT: 44.1 % (ref 36.0–46.0)
Hemoglobin: 14.5 g/dL (ref 12.0–15.0)
Immature Granulocytes: 0 %
Lymphocytes Relative: 36 %
Lymphs Abs: 1.7 10*3/uL (ref 0.7–4.0)
MCH: 31.5 pg (ref 26.0–34.0)
MCHC: 32.9 g/dL (ref 30.0–36.0)
MCV: 95.9 fL (ref 80.0–100.0)
Monocytes Absolute: 0.3 10*3/uL (ref 0.1–1.0)
Monocytes Relative: 7 %
Neutro Abs: 2.6 10*3/uL (ref 1.7–7.7)
Neutrophils Relative %: 55 %
Platelet Count: 136 10*3/uL — ABNORMAL LOW (ref 150–400)
RBC: 4.6 MIL/uL (ref 3.87–5.11)
RDW: 12.7 % (ref 11.5–15.5)
WBC Count: 4.8 10*3/uL (ref 4.0–10.5)
nRBC: 0 % (ref 0.0–0.2)

## 2019-09-22 LAB — LACTATE DEHYDROGENASE: LDH: 170 U/L (ref 98–192)

## 2019-09-22 MED ORDER — VENLAFAXINE HCL ER 37.5 MG PO CP24
37.5000 mg | ORAL_CAPSULE | Freq: Every day | ORAL | 1 refills | Status: DC
Start: 2019-09-22 — End: 2022-11-08

## 2019-09-22 NOTE — Progress Notes (Signed)
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 female who has been self referred to [place] for evaluation and management of newly noted splenomegaly.  Patient does not have many chronic medical comorbidities. 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 She also visited [place], [place], [place] and a few other places last 4 years.  A couple months ago while still in [place] she noticed some fullness in the upper and left upper abdomen and had an ultrasound which showed splenomegaly with the spleen length of 16.6 cm with no overt focal splenic lesions. No overt abdominal adenopathy noted. No free fluid. Liver, gallbladder, pancreas, both kidneys loss of bowel were noted to be within normal limits. There was incidental finding of a 4.2 cm thin-walled unilocular left ovarian cyst with no solid components. Normal uterus and right ovary was seen.  Her blood counts done on 09/02/2015 showed normal hemoglobin of 14 with an MCV of 91, WBC count of 5.9k with normal differential. Platelet clumping was noted with reported count of 103k. Malaria parasite smear was done and no other parasites were noted.  INTERVAL HISTORY: Ms Bettinger is here for management and evaluation of her Splenic marginal zone lymphoma. The patient's last visit with [place] was on 05/06/2018. The pt reports that she is doing well overall.  The pt reports that she has felt progressive and continuous fatigue. She often feels tired "behind her eyes" and has weakness in her muscles. Pt also has left-sided abdominal pain that is accompanied by a black and blue bruise. It is not worsened or improved by foods or  stretching. It has been steady for the last few months and is most uncomfortable after pressure is applied. Pt has hot flashes daily, which are worse at night.   Lab results today (09/22/19) of CBC w/diff and CMP is as follows: all values are WNL except for PLT at 136K. 09/22/2019 Reticulocytes is as follows: Retic Ct Pct at 1.5, RBC at 4.56, Retic Count Abs at 67.9, Immature Retic Fract at 4.8 09/22/2019 LDH at 170  On review of systems, pt reports fatigue, abdominal soreness, hot flashes and denies fevers, chills and any other symptoms.    MEDICAL HISTORY:   #1 Dyslipidemia - not on any medications #2 GERD #3 hot flashes menopausal status was previously on Climara patch #4 history of sebaceous cysts. #5 history of recent left breast cysts - aspirated by ultrasound guidance and noted to be negative for cancer. #6 family history of breast cancer in her sister when she was in her 51s. No genetic testing done at that time.  SURGICAL HISTORY:  #1 breast cysts 2 aspiration on the left side . #2 root canal treatments #3 sebaceous cyst excision   SOCIAL HISTORY: Social History   Socioeconomic History  . Marital status: Widowed    Spouse name: Not on file  . Number of children: Not on file  . Years of education: Not on file  . Highest education level: Not on file  Occupational History  . Not on file  Tobacco Use  . Smoking status: Never Smoker  . Smokeless tobacco: Never  Used  Substance and Sexual Activity  . Alcohol use: Yes    Alcohol/week: 1.0 standard drinks    Types: 1 Glasses of wine per week    Comment: occasional  . Drug use: No  . Sexual activity: Not on file  Other Topics Concern  . Not on file  Social History Narrative  . Not on file   Social Determinants of Health   Financial Resource Strain:   . Difficulty of Paying Living Expenses:   Food Insecurity:   . Worried About Charity fundraiser in the Last Year:   . Arboriculturist in the Last Year:     Transportation Needs:   . Film/video editor (Medical):   [name] Lack of Transportation (Non-Medical):   Physical Activity:   . Days of Exercise per Week:   . Minutes of Exercise per Session:   Stress:   . Feeling of Stress :   Social Connections:   . Frequency of Communication with Friends and Family:   . Frequency of Social Gatherings with Friends and Family:   . Attends Religious Services:   . Active Member of Clubs or Organizations:   . Attends Archivist Meetings:   [name] Marital Status:   Intimate Partner Violence:   . Fear of Current or Ex-Partner:   . Emotionally Abused:   [name] Physically Abused:   . Sexually Abused:     FAMILY HISTORY: Family History  Problem Relation Age of Onset  . Breast cancer Sister        early 52's  . Breast cancer Maternal Aunt        diagnosed in her 80's    ALLERGIES:  is allergic to codeine.  MEDICATIONS:  Current Outpatient Medications  Medication Sig Dispense Refill  . aspirin 81 MG EC tablet Take 81 mg by mouth daily. Swallow whole.    . docusate sodium (COLACE) 100 MG capsule Take 1 capsule (100 mg total) by mouth 2 (two) times daily. (Patient not taking: Reported on 12/18/2017) 30 capsule 0  . ibuprofen (ADVIL,MOTRIN) 600 MG tablet Take 1 tablet (600 mg total) by mouth every 6 (six) hours as needed. (Patient not taking: Reported on 12/18/2017) 30 tablet 0  . Misc Natural Products (TART CHERRY ADVANCED PO) Take by mouth.    . Omega-3 Fatty Acids (FISH OIL) 1200 MG CAPS Take by mouth.    . ondansetron (ZOFRAN) 4 MG tablet Take 1 tablet (4 mg total) by mouth every 8 (eight) hours as needed for nausea or vomiting. (Patient not taking: Reported on 12/18/2017) 20 tablet 0  . vitamin E 100 UNIT capsule Take by mouth daily.     No current facility-administered medications for this visit.    REVIEW OF SYSTEMS:   A 10+ POINT REVIEW OF SYSTEMS WAS OBTAINED including neurology, dermatology, psychiatry, cardiac, respiratory, lymph,  extremities, GI, GU, Musculoskeletal, constitutional, breasts, reproductive, HEENT.  All pertinent positives are noted in the HPI.  All others are negative.   PHYSICAL EXAMINATION: ECOG PERFORMANCE STATUS: 0 - Asymptomatic   GENERAL:alert, in no acute distress and comfortable SKIN: no acute rashes, no significant lesions EYES: conjunctiva are pink and non-injected, sclera anicteric OROPHARYNX: MMM, no exudates, no oropharyngeal erythema or ulceration NECK: supple, no JVD LYMPH:  no palpable lymphadenopathy in the cervical, axillary or inguinal regions LUNGS: clear to auscultation b/l with normal respiratory effort HEART: regular rate & rhythm ABDOMEN:  normoactive bowel sounds , non tender, not distended. No palpable hepatosplenomegaly.  Extremity: no pedal edema PSYCH: alert & oriented x 3 with fluent speech NEURO: no focal motor/sensory deficits  LABORATORY DATA:  I have reviewed the data as listed  . CBC Latest Ref Rng & Units 05/06/2018 12/18/2017 10/28/2017  WBC 4.0 - 10.5 K/uL 4.6 4.7 6.0  Hemoglobin 12.0 - 15.0 g/dL 14.0 14.8 14.3  Hematocrit 36.0 - 46.0 % 41.5 44.7 41.3  Platelets 150 - 400 K/uL 159 132(L) 138(L)   . CBC    Component Value Date/Time   WBC 4.6 05/06/2018 0844   RBC 4.44 05/06/2018 0844   HGB 14.0 05/06/2018 0844   HGB 14.7 06/10/2017 0852   HGB 14.0 03/22/2017 0950   HCT 41.5 05/06/2018 0844   HCT 41.9 03/22/2017 0950   PLT 159 05/06/2018 0844   PLT 163 06/10/2017 0852   PLT 136 (L) 03/22/2017 0950   MCV 93.5 05/06/2018 0844   MCV 93.3 03/22/2017 0950   MCH 31.5 05/06/2018 0844   MCHC 33.7 05/06/2018 0844   RDW 13.0 05/06/2018 0844   RDW 15.0 (H) 03/22/2017 0950   LYMPHSABS 1.4 05/06/2018 0844   LYMPHSABS 1.5 03/22/2017 0950   MONOABS 0.3 05/06/2018 0844   MONOABS 0.3 03/22/2017 0950   EOSABS 0.1 05/06/2018 0844   EOSABS 0.1 03/22/2017 0950   BASOSABS 0.0 05/06/2018 0844   BASOSABS 0.0 03/22/2017 0950    . CMP Latest Ref Rng & Units  05/06/2018 12/18/2017 10/28/2017  Glucose 70 - 99 mg/dL 92 73 100(H)  BUN 8 - 23 mg/dL _0 Creatinine 0.44 - 1.00 mg/dL 0.76 0.83 0.72  Sodium 135 - 145 mmol/L 140 143 135  Potassium 3.5 - 5.1 mmol/L 3.9 3.7 4.2  Chloride 98 - 111 mmol/L 107 106 102  CO2 22 - 32 mmol/L _1 Calcium 8.9 - 10.3 mg/dL 9.0 9.8 9.4  Total Protein 6.5 - 8.1 g/dL 6.3(L) 6.9 7.1  Total Bilirubin 0.3 - 1.2 mg/dL 0.7 0.7 1.0  Alkaline Phos 38 - 126 U/L 86 82 73  AST 15 - 41 U/L 15 14(L) 23  ALT 0 - 44 U/L _2 . Lab Results  Component Value Date   LDH 180 05/06/2018        Hematopathology Order10/03/2017 [place]  BONE MARROW BIOPSY 01/25/2017: Interpretation Fourteen of the twenty metaphase cells analyzed are normal (46,XX). Six cells are abnormal and contain a deletion within the long arm of chromosome 7 and additional material of unknown origin attached to the long arm of chromosome 14 at or near the [facility] locus. Deletion of 7q is seen in mature B-cell neoplasms including marginal zone lymphoma. Ten cells were analyzed from the 24 hour unstimulated culture and ten cells were analyzed from the 72 hour culture stimulated with pokeweed mitogen, phorbol 12-myristate 13-acetate (PMA), and a CpG-oligodeoxynucleotide (ODN). The abnormal clone was only seen in the 72 hour stimulated culture.   Component Name Value Ref Range  Case Report Surgical Pathology Report Case: [id number]  Authorizing Provider:Rebecca Karie Kirks Sawchak,Collected: 01/25/2017 1100  AGNP  [facility]ing [facility]IONReceived:01/25/2017 [place] Pathologist: Trudee Kuster,  MD  Specimens: A) - Bone Marrow Right - Aspirate  B) - Bone Marrow Right - Biopsy  C) - Peripheral Blood   Final Diagnosis Bone marrow, right iliac, aspiration and biopsy -  Hypercellular bone marrow (70%) with involvement by mature B-cell lymphoma with non-specific immunophenotype (representing approximately 20% of marrow cellularity by immunohistochemical analysis) (See Comment)  -  Cytogenetic studies are pending.     RADIOGRAPHIC STUDIES: I have personally reviewed the radiological images as listed and agreed with the findings in the report. No results found.  ASSESSMENT & PLAN:   69 y.o. female with   1) Splenic marginal zone lymphoma. Presented with splenomegaly and cytopenia with constitutional sypmtoms fatigue and night sweats - now resolved with Rituxan treatment. HIV, hepatitis C and hepatitis B serologies unrevealing  SPEP shows no M spike and negative IFE.  BM [facility] -  Hypercellular bone marrow (70%) with involvement by mature B-cell lymphoma with non-specific immunophenotype (representing approximately 20% of marrow cellularity by immunohistochemical analysis) Cytogenetics 7q deletion  2) Hot flashes grade 2  Patient declines consideration of medications for this.  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 Now resolved  4) Rituxan related Rash - grade 1. Resolved and did not recur with dose 2 and 3 and 4 with adjusted premedications.  5) left ovarian cyst - 4.2 cm . 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 CA-125 levels were 28.6 on the normal range of 0-35 .  Alpha-fetoprotein was within normal limits and 1.8 . CEA level was 1.3 . 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 Patient notes that her sister had breast cancer in her 83s . Has not had genetic testing . No other family history of breast cancer ovarian cancer pancreatic cancer or melanoma . She has had recent left breast cysts that were aspirated in [place] and per her report were noted to be benign . Plan -yrly MMG with PCP (last done 12/31/2017) - negative   8) history of depression was previously on Lexapro currently not on any medications . Lost her husband 2009 to throat cancer .  PLAN: -Discussed pt labwork today, 09/22/19; mild thrombocytopenia, other blood counts are nml, blood chemistries are nml, LDH is WNL, Reticulocytes are all WNL -No lab or clinical evidence of recurrence of pt's Splenic Marginal Zone Lymphoma at this time.  -Advised pt that abdominal pain is likely due to a musculoskeletal injury. If pain persists, recommend pt receive imaging of the area.  -Discussed using Venlafaxine to suppress hot flashes. Pt is keen to try this option. Will see back in 6 weeks to review dosage/side effects.  -Advised pt that there is a risk of suicical ideations when using SSRIs. Recommend pt contact us immediately if this is the case.  -Will see back in 6 weeks with labs   FOLLOW UP: RTC with Dr Irene Limbo in 6 weeks with labs    The total time spent in the appt was 20 minutes and more than 50% was on counseling and direct patient cares.  All of the patient's questions were answered with apparent satisfaction. The patient knows to call the clinic with any problems, questions or concerns.  Sullivan Lone MD [place] AAHIVMS [facility] Hematology/Oncology Physician [facility]  (Office):       [phone] (Work cell):  [phone] (Fax):            [phone]  I, Yevette Edwards, am acting as a scribe for Dr. Sullivan Lone.   .I have reviewed the above documentation for accuracy and completeness, and I agree with the above. Brunetta Genera MD

## 2019-09-25 ENCOUNTER — Telehealth: Payer: Self-pay | Admitting: Hematology and Oncology

## 2019-09-25 ENCOUNTER — Telehealth: Payer: Self-pay | Admitting: Hematology

## 2019-09-25 NOTE — Telephone Encounter (Signed)
Scheduled per 06/08 los, patient has been called and voicemail was left. 

## 2019-09-25 NOTE — Telephone Encounter (Signed)
Opened by accident, disregard.

## 2019-11-02 ENCOUNTER — Inpatient Hospital Stay: Payer: Medicare HMO | Admitting: Hematology

## 2019-11-02 ENCOUNTER — Inpatient Hospital Stay: Payer: Medicare HMO | Attending: Hematology

## 2019-11-02 ENCOUNTER — Other Ambulatory Visit: Payer: Self-pay

## 2019-11-02 VITALS — BP 119/63 | HR 52 | Temp 97.5°F | Resp 18 | Ht 63.0 in | Wt 133.7 lb

## 2019-11-02 DIAGNOSIS — D696 Thrombocytopenia, unspecified: Secondary | ICD-10-CM | POA: Diagnosis not present

## 2019-11-02 DIAGNOSIS — C8307 Small cell B-cell lymphoma, spleen: Secondary | ICD-10-CM

## 2019-11-02 DIAGNOSIS — C884 Extranodal marginal zone B-cell lymphoma of mucosa-associated lymphoid tissue [MALT-lymphoma]: Secondary | ICD-10-CM | POA: Diagnosis not present

## 2019-11-02 DIAGNOSIS — N951 Menopausal and female climacteric states: Secondary | ICD-10-CM

## 2019-11-02 LAB — CBC WITH DIFFERENTIAL/PLATELET
Abs Immature Granulocytes: 0.01 10*3/uL (ref 0.00–0.07)
Basophils Absolute: 0 10*3/uL (ref 0.0–0.1)
Basophils Relative: 1 %
Eosinophils Absolute: 0.1 10*3/uL (ref 0.0–0.5)
Eosinophils Relative: 3 %
HCT: 43.3 % (ref 36.0–46.0)
Hemoglobin: 14.3 g/dL (ref 12.0–15.0)
Immature Granulocytes: 0 %
Lymphocytes Relative: 34 %
Lymphs Abs: 1.7 10*3/uL (ref 0.7–4.0)
MCH: 31.8 pg (ref 26.0–34.0)
MCHC: 33 g/dL (ref 30.0–36.0)
MCV: 96.4 fL (ref 80.0–100.0)
Monocytes Absolute: 0.3 10*3/uL (ref 0.1–1.0)
Monocytes Relative: 6 %
Neutro Abs: 2.8 10*3/uL (ref 1.7–7.7)
Neutrophils Relative %: 56 %
Platelets: 142 10*3/uL — ABNORMAL LOW (ref 150–400)
RBC: 4.49 MIL/uL (ref 3.87–5.11)
RDW: 12.8 % (ref 11.5–15.5)
WBC: 4.9 10*3/uL (ref 4.0–10.5)
nRBC: 0 % (ref 0.0–0.2)

## 2019-11-02 LAB — CMP (CANCER CENTER ONLY)
ALT: 14 U/L (ref 0–44)
AST: 16 U/L (ref 15–41)
Albumin: 4.2 g/dL (ref 3.5–5.0)
Alkaline Phosphatase: 86 U/L (ref 38–126)
Anion gap: 8 (ref 5–15)
BUN: 12 mg/dL (ref 8–23)
CO2: 26 mmol/L (ref 22–32)
Calcium: 9.3 mg/dL (ref 8.9–10.3)
Chloride: 105 mmol/L (ref 98–111)
Creatinine: 0.83 mg/dL (ref 0.44–1.00)
GFR, Est AFR Am: >60 mL/min (ref >60)
GFR, Estimated: >60 mL/min (ref >60)
Glucose, Bld: 92 mg/dL (ref 70–99)
Potassium: 4.1 mmol/L (ref 3.5–5.1)
Sodium: 139 mmol/L (ref 135–145)
Total Bilirubin: 0.7 mg/dL (ref 0.3–1.2)
Total Protein: 6.8 g/dL (ref 6.5–8.1)

## 2019-11-02 NOTE — Progress Notes (Signed)
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 female who has been self referred to [place] for evaluation and management of newly noted splenomegaly.  Patient does not have many chronic medical comorbidities. 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 She also visited [place], [place], [place] and a few other places last 4 years.  A couple months ago while still in [place] she noticed some fullness in the upper and left upper abdomen and had an ultrasound which showed splenomegaly with the spleen length of 16.6 cm with no overt focal splenic lesions. No overt abdominal adenopathy noted. No free fluid. Liver, gallbladder, pancreas, both kidneys loss of bowel were noted to be within normal limits. There was incidental finding of a 4.2 cm thin-walled unilocular left ovarian cyst with no solid components. Normal uterus and right ovary was seen.  Her blood counts done on 09/02/2015 showed normal hemoglobin of 14 with an MCV of 91, WBC count of 5.9k with normal differential. Platelet clumping was noted with reported count of 103k. Malaria parasite smear was done and no other parasites were noted.  INTERVAL HISTORY: Joanne Miller is here for management and evaluation of her Splenic marginal zone lymphoma. The patient's last visit with [place] was on 09/22/2019. The pt reports that she is doing well overall.  The pt reports that she was very hyper and was having difficulty sleeping while taking Venlafaxine. She discontinued the medication shortly after beginning. Pt is managing her hot flashes with increased activity and teas.   Lab results today (11/02/19) of CBC w/diff  and CMP is as follows: all values are WNL except for PLT at 142K.  On review of systems, pt reports hot flashes and denies low appetite, abdominal pain, abdominal distention, fevers, chills, night sweats and any other symptoms.    MEDICAL HISTORY:   #1 Dyslipidemia - not on any medications #2 GERD #3 hot flashes menopausal status was previously on Climara patch #4 history of sebaceous cysts. #5 history of recent left breast cysts - aspirated by ultrasound guidance and noted to be negative for cancer. #6 family history of breast cancer in her sister when she was in her 81s. No genetic testing done at that time.  SURGICAL HISTORY:  #1 breast cysts 2 aspiration on the left side . #2 root canal treatments #3 sebaceous cyst excision   SOCIAL HISTORY: Social History   Socioeconomic History  . Marital status: Widowed    Spouse name: Not on file  . Number of children: Not on file  . Years of education: Not on file  . Highest education level: Not on file  Occupational History  . Not on file  Tobacco Use  . Smoking status: Never Smoker  . Smokeless tobacco: Never Used  Vaping Use  . Vaping Use: Never used  Substance and Sexual Activity  . Alcohol use: Yes    Alcohol/week: 1.0 standard drink    Types: 1 Glasses of wine per week    Comment: occasional  . Drug use: No  . Sexual activity: Not on file  Other Topics Concern  . Not on file  Social History Narrative  . Not on file   Social Determinants of Health   Financial Resource Strain:   . Difficulty of Paying Living Expenses:   Food Insecurity:   . Worried About Charity fundraiser in the Last Year:   . Arboriculturist in the Last Year:   Transportation Needs:   . Film/video editor (Medical):   [name] Lack of Transportation (Non-Medical):   Physical Activity:   . Days of Exercise per Week:   . Minutes of Exercise per Session:   Stress:   . Feeling of Stress :   Social Connections:   . Frequency of Communication  with Friends and Family:   . Frequency of Social Gatherings with Friends and Family:   . Attends Religious Services:   . Active Member of Clubs or Organizations:   . Attends Archivist Meetings:   [name] Marital Status:   Intimate Partner Violence:   . Fear of Current or Ex-Partner:   . Emotionally Abused:   [name] Physically Abused:   . Sexually Abused:     FAMILY HISTORY: Family History  Problem Relation Age of Onset  . Breast cancer Sister        early 71's  . Breast cancer Maternal Aunt        diagnosed in her 37's    ALLERGIES:  is allergic to amoxicillin-pot clavulanate, medroxyprogesterone acetate, and codeine.  MEDICATIONS:  Current Outpatient Medications  Medication Sig Dispense Refill  . aspirin 81 MG EC tablet Take 81 mg by mouth daily. Swallow whole.    . docusate sodium (COLACE) 100 MG capsule Take 1 capsule (100 mg total) by mouth 2 (two) times daily. (Patient not taking: Reported on 12/18/2017) 30 capsule 0  . ibuprofen (ADVIL,MOTRIN) 600 MG tablet Take 1 tablet (600 mg total) by mouth every 6 (six) hours as needed. (Patient not taking: Reported on 12/18/2017) 30 tablet 0  . Misc Natural Products (TART CHERRY ADVANCED PO) Take by mouth.    . Omega-3 Fatty Acids (FISH OIL) 1200 MG CAPS Take by mouth.    . ondansetron (ZOFRAN) 4 MG tablet Take 1 tablet (4 mg total) by mouth every 8 (eight) hours as needed for nausea or vomiting. (Patient not taking: Reported on 12/18/2017) 20 tablet 0  . venlafaxine XR (EFFEXOR-XR) 37.5 MG 24 hr capsule Take 1 capsule (37.5 mg total) by mouth daily with breakfast. (Patient not taking: Reported on 09/22/2019) 30 capsule 1  . vitamin E 100 UNIT capsule Take by mouth daily.     No current facility-administered medications for this visit.    REVIEW OF SYSTEMS:   A 10+ POINT REVIEW OF SYSTEMS WAS OBTAINED including neurology, dermatology, psychiatry, cardiac, respiratory, lymph, extremities, GI, GU, Musculoskeletal, constitutional, breasts,  reproductive, HEENT.  All pertinent positives are noted in the HPI.  All others are negative.   PHYSICAL EXAMINATION: ECOG PERFORMANCE STATUS: 0 - Asymptomatic   GENERAL:alert, in no acute distress and comfortable SKIN: no acute rashes, no significant lesions EYES: conjunctiva are pink and non-injected, sclera anicteric OROPHARYNX: MMM, no exudates, no oropharyngeal erythema or ulceration NECK: supple, no JVD LYMPH:  no palpable lymphadenopathy in the cervical, axillary or inguinal regions LUNGS: clear to auscultation b/l with normal respiratory effort HEART: regular rate & rhythm ABDOMEN:  normoactive bowel sounds , non tender, not distended. No palpable hepatosplenomegaly.  Extremity: no pedal edema PSYCH: alert & oriented x 3 with fluent speech NEURO: no focal motor/sensory deficits  LABORATORY DATA:  I have reviewed the data as listed  . CBC Latest Ref Rng & Units 11/02/2019 09/22/2019 05/06/2018  WBC 4.0 - 10.5 K/uL 4.9 4.8 4.6  Hemoglobin 12.0 - 15.0 g/dL 14.3 14.5 14.0  Hematocrit 36 - 46 % 43.3 44.1 41.5  Platelets 150 - 400 K/uL 142(L) 136(L) 159   . CBC    Component Value Date/Time   WBC 4.9 11/02/2019 0930   RBC 4.49 11/02/2019 0930   HGB 14.3 11/02/2019 0930   HGB 14.5 09/22/2019 0854   HGB 14.0 03/22/2017 0950   HCT 43.3 11/02/2019 0930   HCT 41.9 03/22/2017 0950   PLT 142 (L) 11/02/2019 0930   PLT 136 (L) 09/22/2019 0854   PLT 136 (L) 03/22/2017 0950   MCV 96.4 11/02/2019 0930   MCV 93.3 03/22/2017 0950   MCH 31.8 11/02/2019 0930   MCHC 33.0 11/02/2019 0930   RDW 12.8 11/02/2019 0930   RDW 15.0 (H) 03/22/2017 0950   LYMPHSABS 1.7 11/02/2019 0930   LYMPHSABS 1.5 03/22/2017 0950   MONOABS 0.3 11/02/2019 0930   MONOABS 0.3 03/22/2017 0950   EOSABS 0.1 11/02/2019 0930   EOSABS 0.1 03/22/2017 0950   BASOSABS 0.0 11/02/2019 0930   BASOSABS 0.0 03/22/2017 0950    . CMP Latest Ref Rng & Units 11/02/2019 09/22/2019 05/06/2018  Glucose 70 - 99 mg/dL 92 78 92   BUN 8 - 23 mg/dL '12 12 12  ' Creatinine 0.44 - 1.00 mg/dL 0.83 0.79 0.76  Sodium 135 - 145 mmol/L 139 142 140  Potassium 3.5 - 5.1 mmol/L 4.1 4.4 3.9  Chloride 98 - 111 mmol/L 105 108 107  CO2 22 - 32 mmol/L '26 23 24  ' Calcium 8.9 - 10.3 mg/dL 9.3 9.4 9.0  Total Protein 6.5 - 8.1 g/dL 6.8 6.5 6.3(L)  Total Bilirubin 0.3 - 1.2 mg/dL 0.7 0.6 0.7  Alkaline Phos 38 - 126 U/L 86 84 86  AST 15 - 41 U/L '16 20 15  ' ALT 0 - 44 U/L '14 28 14   ' . Lab Results  Component Value Date   LDH 170 09/22/2019        Hematopathology Order10/03/2017 [place]  BONE MARROW BIOPSY 01/25/2017: Interpretation Fourteen of the twenty metaphase cells analyzed are normal (46,XX). Six cells are abnormal and contain a deletion within the long arm of chromosome 7 and additional material of unknown origin attached to the long arm of chromosome 14 at or near the [facility] locus. Deletion of 7q is seen in mature B-cell neoplasms including marginal zone lymphoma. Ten cells were analyzed from the 24 hour unstimulated culture and ten cells were analyzed from the 72 hour culture stimulated with pokeweed mitogen, phorbol 12-myristate 13-acetate (PMA), and a CpG-oligodeoxynucleotide (ODN). The abnormal clone was only seen in the 72 hour stimulated culture.   Component Name Value Ref Range  Case Report Surgical Pathology Report Case: [id number]  Authorizing Provider:Rebecca Karie Kirks Sawchak,Collected: 01/25/2017 1100  AGNP  [facility]ing [facility]IONReceived:01/25/2017 [place] Pathologist: Trudee Kuster,   MD  Specimens: A) - Bone Marrow Right - Aspirate  B) - Bone Marrow Right - Biopsy  C) - Peripheral Blood   Final Diagnosis Bone marrow, right iliac, aspiration and biopsy -  Hypercellular bone marrow (70%) with involvement by mature B-cell lymphoma with non-specific immunophenotype (representing approximately 20% of marrow cellularity by immunohistochemical analysis) (See Comment)  -  Cytogenetic studies are pending.     RADIOGRAPHIC STUDIES: I have personally reviewed the radiological images  as listed and agreed with the findings in the report. No results found.  ASSESSMENT & PLAN:   69 y.o. female with   1) Splenic marginal zone lymphoma. Presented with splenomegaly and cytopenia with constitutional sypmtoms fatigue and night sweats - now resolved with Rituxan treatment. HIV, hepatitis C and hepatitis B serologies unrevealing  SPEP shows no M spike and negative IFE.  BM Bx done at [facility] -  Hypercellular bone marrow (70%) with involvement by mature B-cell lymphoma with non-specific immunophenotype (representing approximately 20% of marrow cellularity by immunohistochemical analysis) Cytogenetics 7q deletion  2) Hot flashes grade 2  Patient declines consideration of medications for this.  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 Now resolved  4) Rituxan related Rash - grade 1. Resolved and did not recur with dose 2 and 3 and 4 with adjusted premedications.  5) left ovarian cyst - 4.2 cm . 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 CA-125 levels were 28.6 on the normal range of 0-35 . Alpha-fetoprotein was within normal limits and 1.8 . CEA level was 1.3 . 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 Patient notes that her sister had breast cancer in her 68s . Has not had genetic testing . No other family history of breast cancer ovarian cancer pancreatic cancer or melanoma . She has had recent left breast cysts that were aspirated in [place] and per her report were noted to be benign . Plan -yrly MMG with PCP (last done 12/31/2017) - negative   8) history of depression was previously on Lexapro currently not on any medications . Lost her husband 2009 to throat cancer .  PLAN: -Discussed pt labwork today, 11/02/19; blood counts and chemistries are nml -No lab or clinical evidence of recurrence of pt's Splenic Marginal Zone Lymphoma at this time. -Will see back in 6 months with labs    FOLLOW UP: RTC with Dr Irene Limbo in 6 months with labs    The total time spent in the appt was 15 minutes and more than 50% was on counseling and direct patient cares.  All of the patient's questions were answered with apparent satisfaction. The patient knows to call the clinic with any problems, questions or concerns.   Sullivan Lone MD [place] AAHIVMS [facility] Hematology/Oncology Physician [facility]  (Office):       [phone] (Work cell):  [phone] (Fax):           [phone]  I, Yevette Edwards, am acting as a scribe for Dr. Sullivan Lone.   .I have reviewed the above documentation for accuracy and completeness, and I agree with the above. Brunetta Genera MD

## 2019-11-03 DIAGNOSIS — Z791 Long term (current) use of non-steroidal anti-inflammatories (NSAID): Secondary | ICD-10-CM | POA: Diagnosis not present

## 2019-11-03 DIAGNOSIS — C8307 Small cell B-cell lymphoma, spleen: Secondary | ICD-10-CM | POA: Diagnosis not present

## 2019-11-03 DIAGNOSIS — R030 Elevated blood-pressure reading, without diagnosis of hypertension: Secondary | ICD-10-CM | POA: Diagnosis not present

## 2019-11-03 DIAGNOSIS — M1990 Unspecified osteoarthritis, unspecified site: Secondary | ICD-10-CM | POA: Diagnosis not present

## 2019-11-03 DIAGNOSIS — Z885 Allergy status to narcotic agent status: Secondary | ICD-10-CM | POA: Diagnosis not present

## 2019-11-06 ENCOUNTER — Telehealth: Payer: Self-pay | Admitting: Hematology

## 2019-11-06 NOTE — Telephone Encounter (Signed)
Scheduled per 07/19 los, patient has been called and notified. 

## 2019-11-17 DIAGNOSIS — G43819 Other migraine, intractable, without status migrainosus: Secondary | ICD-10-CM | POA: Diagnosis not present

## 2019-11-17 DIAGNOSIS — R69 Illness, unspecified: Secondary | ICD-10-CM | POA: Diagnosis not present

## 2019-11-17 DIAGNOSIS — H40013 Open angle with borderline findings, low risk, bilateral: Secondary | ICD-10-CM | POA: Diagnosis not present

## 2019-11-17 DIAGNOSIS — H04123 Dry eye syndrome of bilateral lacrimal glands: Secondary | ICD-10-CM | POA: Diagnosis not present

## 2019-11-17 DIAGNOSIS — H43393 Other vitreous opacities, bilateral: Secondary | ICD-10-CM | POA: Diagnosis not present

## 2019-12-09 ENCOUNTER — Other Ambulatory Visit: Payer: Self-pay | Admitting: Family Medicine

## 2019-12-09 DIAGNOSIS — Z1231 Encounter for screening mammogram for malignant neoplasm of breast: Secondary | ICD-10-CM

## 2019-12-25 DIAGNOSIS — R69 Illness, unspecified: Secondary | ICD-10-CM | POA: Diagnosis not present

## 2019-12-28 DIAGNOSIS — R69 Illness, unspecified: Secondary | ICD-10-CM | POA: Diagnosis not present

## 2020-01-03 DIAGNOSIS — Z1152 Encounter for screening for COVID-19: Secondary | ICD-10-CM | POA: Diagnosis not present

## 2020-01-14 ENCOUNTER — Other Ambulatory Visit: Payer: Self-pay

## 2020-01-14 ENCOUNTER — Ambulatory Visit
Admission: RE | Admit: 2020-01-14 | Discharge: 2020-01-14 | Disposition: A | Discharge status: Home / Self Care | Payer: Medicare HMO | Source: Ambulatory Visit | Attending: Family Medicine | Admitting: Family Medicine

## 2020-01-14 DIAGNOSIS — Z1231 Encounter for screening mammogram for malignant neoplasm of breast: Secondary | ICD-10-CM

## 2020-01-20 DIAGNOSIS — Z20822 Contact with and (suspected) exposure to covid-19: Secondary | ICD-10-CM | POA: Diagnosis not present

## 2020-01-20 DIAGNOSIS — R059 Cough, unspecified: Secondary | ICD-10-CM | POA: Diagnosis not present

## 2020-01-20 DIAGNOSIS — J069 Acute upper respiratory infection, unspecified: Secondary | ICD-10-CM | POA: Diagnosis not present

## 2020-02-19 DIAGNOSIS — R69 Illness, unspecified: Secondary | ICD-10-CM | POA: Diagnosis not present

## 2020-03-08 DIAGNOSIS — N390 Urinary tract infection, site not specified: Secondary | ICD-10-CM | POA: Diagnosis not present

## 2020-03-14 DIAGNOSIS — R319 Hematuria, unspecified: Secondary | ICD-10-CM | POA: Diagnosis not present

## 2020-03-14 DIAGNOSIS — N390 Urinary tract infection, site not specified: Secondary | ICD-10-CM | POA: Diagnosis not present

## 2020-03-28 DIAGNOSIS — N390 Urinary tract infection, site not specified: Secondary | ICD-10-CM | POA: Diagnosis not present

## 2020-03-28 DIAGNOSIS — M799 Soft tissue disorder, unspecified: Secondary | ICD-10-CM | POA: Diagnosis not present

## 2020-03-28 DIAGNOSIS — R319 Hematuria, unspecified: Secondary | ICD-10-CM | POA: Diagnosis not present

## 2020-04-06 DIAGNOSIS — Z0000 Encounter for general adult medical examination without abnormal findings: Secondary | ICD-10-CM | POA: Diagnosis not present

## 2020-04-06 DIAGNOSIS — C8580 Other specified types of non-Hodgkin lymphoma, unspecified site: Secondary | ICD-10-CM | POA: Diagnosis not present

## 2020-04-06 DIAGNOSIS — E785 Hyperlipidemia, unspecified: Secondary | ICD-10-CM | POA: Diagnosis not present

## 2020-04-06 DIAGNOSIS — E2839 Other primary ovarian failure: Secondary | ICD-10-CM | POA: Diagnosis not present

## 2020-04-12 ENCOUNTER — Other Ambulatory Visit: Payer: Self-pay | Admitting: Family Medicine

## 2020-04-12 DIAGNOSIS — E2839 Other primary ovarian failure: Secondary | ICD-10-CM

## 2020-05-03 ENCOUNTER — Other Ambulatory Visit: Payer: Self-pay

## 2020-05-03 ENCOUNTER — Inpatient Hospital Stay: Payer: Medicare HMO | Attending: Hematology

## 2020-05-03 ENCOUNTER — Inpatient Hospital Stay: Payer: Medicare HMO | Admitting: Hematology

## 2020-05-03 ENCOUNTER — Other Ambulatory Visit: Payer: Self-pay | Admitting: *Deleted

## 2020-05-03 VITALS — BP 111/63 | HR 54 | Temp 97.0°F | Resp 18 | Ht 63.0 in | Wt 133.4 lb

## 2020-05-03 DIAGNOSIS — C8307 Small cell B-cell lymphoma, spleen: Secondary | ICD-10-CM

## 2020-05-03 DIAGNOSIS — R161 Splenomegaly, not elsewhere classified: Secondary | ICD-10-CM | POA: Insufficient documentation

## 2020-05-03 DIAGNOSIS — Z803 Family history of malignant neoplasm of breast: Secondary | ICD-10-CM | POA: Diagnosis not present

## 2020-05-03 DIAGNOSIS — N83202 Unspecified ovarian cyst, left side: Secondary | ICD-10-CM | POA: Diagnosis not present

## 2020-05-03 DIAGNOSIS — C884 Extranodal marginal zone B-cell lymphoma of mucosa-associated lymphoid tissue [MALT-lymphoma]: Secondary | ICD-10-CM | POA: Insufficient documentation

## 2020-05-03 DIAGNOSIS — D696 Thrombocytopenia, unspecified: Secondary | ICD-10-CM | POA: Diagnosis not present

## 2020-05-03 LAB — CMP (CANCER CENTER ONLY)
ALT: 22 U/L (ref 0–44)
AST: 19 U/L (ref 15–41)
Albumin: 4.1 g/dL (ref 3.5–5.0)
Alkaline Phosphatase: 83 U/L (ref 38–126)
Anion gap: 7 (ref 5–15)
BUN: 16 mg/dL (ref 8–23)
CO2: 26 mmol/L (ref 22–32)
Calcium: 9.3 mg/dL (ref 8.9–10.3)
Chloride: 105 mmol/L (ref 98–111)
Creatinine: 0.81 mg/dL (ref 0.44–1.00)
GFR, Estimated: >60 mL/min (ref >60)
Glucose, Bld: 84 mg/dL (ref 70–99)
Potassium: 4.4 mmol/L (ref 3.5–5.1)
Sodium: 138 mmol/L (ref 135–145)
Total Bilirubin: 0.8 mg/dL (ref 0.3–1.2)
Total Protein: 6.6 g/dL (ref 6.5–8.1)

## 2020-05-03 LAB — CBC WITH DIFFERENTIAL (CANCER CENTER ONLY)
Abs Immature Granulocytes: 0.01 10*3/uL (ref 0.00–0.07)
Basophils Absolute: 0 10*3/uL (ref 0.0–0.1)
Basophils Relative: 0 %
Eosinophils Absolute: 0.1 10*3/uL (ref 0.0–0.5)
Eosinophils Relative: 2 %
HCT: 42.2 % (ref 36.0–46.0)
Hemoglobin: 14.1 g/dL (ref 12.0–15.0)
Immature Granulocytes: 0 %
Lymphocytes Relative: 30 %
Lymphs Abs: 1.6 10*3/uL (ref 0.7–4.0)
MCH: 31.6 pg (ref 26.0–34.0)
MCHC: 33.4 g/dL (ref 30.0–36.0)
MCV: 94.6 fL (ref 80.0–100.0)
Monocytes Absolute: 0.4 10*3/uL (ref 0.1–1.0)
Monocytes Relative: 7 %
Neutro Abs: 3.3 10*3/uL (ref 1.7–7.7)
Neutrophils Relative %: 61 %
Platelet Count: 130 10*3/uL — ABNORMAL LOW (ref 150–400)
RBC: 4.46 MIL/uL (ref 3.87–5.11)
RDW: 13 % (ref 11.5–15.5)
WBC Count: 5.4 10*3/uL (ref 4.0–10.5)
nRBC: 0 % (ref 0.0–0.2)

## 2020-05-03 LAB — LACTATE DEHYDROGENASE: LDH: 185 U/L (ref 98–192)

## 2020-05-03 NOTE — Progress Notes (Signed)
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 female who has been self referred to [place] for evaluation and management of newly noted splenomegaly.  Patient does not have many chronic medical comorbidities. 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 She also visited [place], [place], [place] and a few other places last 4 years.  A couple months ago while still in [place] she noticed some fullness in the upper and left upper abdomen and had an ultrasound which showed splenomegaly with the spleen length of 16.6 cm with no overt focal splenic lesions. No overt abdominal adenopathy noted. No free fluid. Liver, gallbladder, pancreas, both kidneys loss of bowel were noted to be within normal limits. There was incidental finding of a 4.2 cm thin-walled unilocular left ovarian cyst with no solid components. Normal uterus and right ovary was seen.  Her blood counts done on 09/02/2015 showed normal hemoglobin of 14 with an MCV of 91, WBC count of 5.9k with normal differential. Platelet clumping was noted with reported count of 103k. Malaria parasite smear was done and no other parasites were noted.  INTERVAL HISTORY: Joanne Miller is here for management and evaluation of her Splenic marginal zone lymphoma. The patient's last visit with [place] was on 11/02/2019. The pt reports that she is doing well overall.  The pt reports that she has been doing well and received both her COVID vaccines and booster. She notes that she had a large bump in her left arm in December unexpectedly. This went away on its own and is better today. She has had blood drawn there before.   The pt  notes that she is not currently on any medications. She is eating well, as well as doing yoga and bodybuilding.  Lab results today 05/03/2020 of CBC w/diff and CMP is as follows: all values are WNL except for PLT at 130K. 05/03/2020 LDH at 185.   On review of systems, pt reports night sweats and denies fevers, chills, abdominal pain, fatigue, new lumps and bumps and any other symptoms.   MEDICAL HISTORY:   #1 Dyslipidemia - not on any medications #2 GERD #3 hot flashes menopausal status was previously on Climara patch #4 history of sebaceous cysts. #5 history of recent left breast cysts - aspirated by ultrasound guidance and noted to be negative for cancer. #6 family history of breast cancer in her sister when she was in her 61s. No genetic testing done at that time.  SURGICAL HISTORY:  #1 breast cysts 2 aspiration on the left side . #2 root canal treatments #3 sebaceous cyst excision   SOCIAL HISTORY: Social History   Socioeconomic History  . Marital status: Widowed    Spouse name: Not on file  . Number of children: Not on file  . Years of education: Not on file  . Highest education level: Not on file  Occupational History  . Not on file  Tobacco Use  . Smoking status: Never Smoker  . Smokeless tobacco: Never Used  Vaping Use  . Vaping Use: Never used  Substance and Sexual Activity  . Alcohol use: Yes  Alcohol/week: 1.0 standard drink    Types: 1 Glasses of wine per week    Comment: occasional  . Drug use: No  . Sexual activity: Not on file  Other Topics Concern  . Not on file  Social History Narrative  . Not on file   Social Determinants of Health   Financial Resource Strain: Not on file  Food Insecurity: Not on file  Transportation Needs: Not on file  Physical Activity: Not on file  Stress: Not on file  Social Connections: Not on file  Intimate Partner Violence: Not on file    FAMILY HISTORY: Family History  Problem Relation Age of Onset  .  Breast cancer Sister        early 43's  . Breast cancer Maternal Aunt        diagnosed in her 57's    ALLERGIES:  is allergic to amoxicillin-pot clavulanate, medroxyprogesterone acetate, and codeine.  MEDICATIONS:  Current Outpatient Medications  Medication Sig Dispense Refill  . aspirin 81 MG EC tablet Take 81 mg by mouth daily. Swallow whole. (Patient not taking: Reported on 11/02/2019)    . docusate sodium (COLACE) 100 MG capsule Take 1 capsule (100 mg total) by mouth 2 (two) times daily. (Patient not taking: Reported on 11/02/2019) 30 capsule 0  . ibuprofen (ADVIL,MOTRIN) 600 MG tablet Take 1 tablet (600 mg total) by mouth every 6 (six) hours as needed. (Patient not taking: Reported on 12/18/2017) 30 tablet 0  . Misc Natural Products (TART CHERRY ADVANCED PO) Take by mouth. (Patient not taking: Reported on 11/02/2019)    . Omega-3 Fatty Acids (FISH OIL) 1200 MG CAPS Take by mouth. (Patient not taking: Reported on 11/02/2019)    . ondansetron (ZOFRAN) 4 MG tablet Take 1 tablet (4 mg total) by mouth every 8 (eight) hours as needed for nausea or vomiting. (Patient not taking: Reported on 12/18/2017) 20 tablet 0  . venlafaxine XR (EFFEXOR-XR) 37.5 MG 24 hr capsule Take 1 capsule (37.5 mg total) by mouth daily with breakfast. (Patient not taking: Reported on 09/22/2019) 30 capsule 1  . vitamin E 100 UNIT capsule Take by mouth daily. (Patient not taking: Reported on 11/02/2019)     No current facility-administered medications for this visit.    REVIEW OF SYSTEMS:   10 Point review of Systems was done is negative except as noted above.   PHYSICAL EXAMINATION: ECOG PERFORMANCE STATUS: 0 - Asymptomatic  .BP 111/63   Pulse (!) 54   Temp (!) 97 F (36.1 C) (Tympanic)   Resp 18   Ht '5\' 3"'  (1.6 m)   Wt 133 lb 6.4 oz (60.5 kg)   SpO2 100%   BMI 23.63 kg/m   GENERAL:alert, in no acute distress and comfortable SKIN: no acute rashes, no significant lesions EYES: conjunctiva are pink and  non-injected, sclera anicteric OROPHARYNX: MMM, no exudates, no oropharyngeal erythema or ulceration NECK: supple, no JVD LYMPH:  no palpable lymphadenopathy in the cervical, axillary or inguinal regions LUNGS: clear to auscultation b/l with normal respiratory effort HEART: regular rate & rhythm ABDOMEN:  normoactive bowel sounds , non tender, not distended. Extremity: no pedal edema PSYCH: alert & oriented x 3 with fluent speech NEURO: no focal motor/sensory deficits   LABORATORY DATA:  I have reviewed the data as listed  . CBC Latest Ref Rng & Units 05/03/2020 11/02/2019 09/22/2019  WBC 4.0 - 10.5 K/uL 5.4 4.9 4.8  Hemoglobin 12.0 - 15.0 g/dL 14.1 14.3 14.5  Hematocrit 36.0 - 46.0 %  42.2 43.3 44.1  Platelets 150 - 400 K/uL 130(L) 142(L) 136(L)   . CBC    Component Value Date/Time   WBC 5.4 05/03/2020 1248   WBC 4.9 11/02/2019 0930   RBC 4.46 05/03/2020 1248   HGB 14.1 05/03/2020 1248   HGB 14.0 03/22/2017 0950   HCT 42.2 05/03/2020 1248   HCT 41.9 03/22/2017 0950   PLT 130 (L) 05/03/2020 1248   PLT 136 (L) 03/22/2017 0950   MCV 94.6 05/03/2020 1248   MCV 93.3 03/22/2017 0950   MCH 31.6 05/03/2020 1248   MCHC 33.4 05/03/2020 1248   RDW 13.0 05/03/2020 1248   RDW 15.0 (H) 03/22/2017 0950   LYMPHSABS 1.6 05/03/2020 1248   LYMPHSABS 1.5 03/22/2017 0950   MONOABS 0.4 05/03/2020 1248   MONOABS 0.3 03/22/2017 0950   EOSABS 0.1 05/03/2020 1248   EOSABS 0.1 03/22/2017 0950   BASOSABS 0.0 05/03/2020 1248   BASOSABS 0.0 03/22/2017 0950    . CMP Latest Ref Rng & Units 05/03/2020 11/02/2019 09/22/2019  Glucose 70 - 99 mg/dL 84 92 78  BUN 8 - 23 mg/dL '16 12 12  ' Creatinine 0.44 - 1.00 mg/dL 0.81 0.83 0.79  Sodium 135 - 145 mmol/L 138 139 142  Potassium 3.5 - 5.1 mmol/L 4.4 4.1 4.4  Chloride 98 - 111 mmol/L 105 105 108  CO2 22 - 32 mmol/L '26 26 23  ' Calcium 8.9 - 10.3 mg/dL 9.3 9.3 9.4  Total Protein 6.5 - 8.1 g/dL 6.6 6.8 6.5  Total Bilirubin 0.3 - 1.2 mg/dL 0.8 0.7 0.6   Alkaline Phos 38 - 126 U/L 83 86 84  AST 15 - 41 U/L '19 16 20  ' ALT 0 - 44 U/L '22 14 28   ' . Lab Results  Component Value Date   LDH 185 05/03/2020        Hematopathology Order10/03/2017 [place]  BONE MARROW BIOPSY 01/25/2017: Interpretation Fourteen of the twenty metaphase cells analyzed are normal (46,XX). Six cells are abnormal and contain a deletion within the long arm of chromosome 7 and additional material of unknown origin attached to the long arm of chromosome 14 at or near the [facility] locus. Deletion of 7q is seen in mature B-cell neoplasms including marginal zone lymphoma. Ten cells were analyzed from the 24 hour unstimulated culture and ten cells were analyzed from the 72 hour culture stimulated with pokeweed mitogen, phorbol 12-myristate 13-acetate (PMA), and a CpG-oligodeoxynucleotide (ODN). The abnormal clone was only seen in the 72 hour stimulated culture.   Component Name Value Ref Range  Case Report Surgical Pathology Report Case: [id number]  Authorizing Provider:Rebecca Karie Kirks Sawchak,Collected: 01/25/2017 1100  AGNP  [facility]ing [facility]IONReceived:01/25/2017 [place] Pathologist: Trudee Kuster,  MD  Specimens: A) - Bone Marrow Right - Aspirate  B) - Bone Marrow Right - Biopsy  C) - Peripheral  Blood   Final Diagnosis Bone marrow, right iliac, aspiration and biopsy -  Hypercellular bone marrow (70%) with involvement by mature B-cell lymphoma with non-specific immunophenotype (representing approximately 20% of marrow cellularity by immunohistochemical analysis) (See Comment)  -  Cytogenetic studies are pending.     RADIOGRAPHIC STUDIES: I have personally reviewed the radiological images as listed and agreed with the findings in the report. No results found.  ASSESSMENT & PLAN:   70 y.o. female with   1) Splenic marginal zone lymphoma. Presented with splenomegaly and cytopenia with constitutional sypmtoms fatigue and night sweats - now resolved with Rituxan treatment. HIV,  hepatitis C and hepatitis B serologies unrevealing  SPEP shows no M spike and negative IFE.  BM Bx done at [facility] -  Hypercellular bone marrow (70%) with involvement by mature B-cell lymphoma with non-specific immunophenotype (representing approximately 20% of marrow cellularity by immunohistochemical analysis) Cytogenetics 7q deletion  2) Hot flashes grade 2  Patient declines consideration of medications for this.  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 Now resolved  4) Rituxan related Rash - grade 1. Resolved and did not recur with dose 2 and 3 and 4 with adjusted premedications.  5) left ovarian cyst - 4.2 cm . 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 CA-125 levels were 28.6 on the normal range of 0-35 . Alpha-fetoprotein was within normal limits and 1.8 . CEA level was 1.3 . 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 Patient notes that her sister had breast cancer in her 30s . Has not had genetic testing . No other family history of breast cancer ovarian cancer pancreatic  cancer or melanoma . She has had recent left breast cysts that were aspirated in [place] and per her report were noted to be benign . -yrly MMG with PCP (last done 12/31/2017) - negative   8) history of depression was previously on Lexapro currently not on any medications . Lost her husband 2009 to throat cancer .  PLAN: -Discussed pt labwork today, 05/03/2020; blood counts stable, blood chemistries normal except PLT at 130K, LDH normal. -No lab or clinical evidence of recurrence/progression  of pt's Splenic Marginal Zone Lymphoma at this time. -Recommend pt continue to hydrate and reduce caffeine intake and take Vitamin D daily to help with night sweats. -Will see back in 6 months with labs.   FOLLOW UP: RTC with Dr Irene Limbo in 6 months with labs     The total time spent in the appointment was 20 minutes and more than 50% was on counseling and direct patient cares.     Sullivan Lone MD [place] AAHIVMS [facility] Hematology/Oncology Physician [facility]  (Office):       [phone] (Work cell):  [phone] (Fax):           [phone]    .I have reviewed the above documentation for accuracy and completeness, and I agree with the above. Brunetta Genera MD

## 2020-05-18 DIAGNOSIS — N3020 Other chronic cystitis without hematuria: Secondary | ICD-10-CM | POA: Diagnosis not present

## 2020-06-24 DIAGNOSIS — N390 Urinary tract infection, site not specified: Secondary | ICD-10-CM | POA: Diagnosis not present

## 2020-06-24 DIAGNOSIS — E785 Hyperlipidemia, unspecified: Secondary | ICD-10-CM | POA: Diagnosis not present

## 2020-06-24 DIAGNOSIS — Z8509 Personal history of malignant neoplasm of other digestive organs: Secondary | ICD-10-CM | POA: Diagnosis not present

## 2020-06-24 DIAGNOSIS — N3020 Other chronic cystitis without hematuria: Secondary | ICD-10-CM | POA: Diagnosis not present

## 2020-06-28 ENCOUNTER — Other Ambulatory Visit: Payer: Medicare HMO

## 2020-06-28 ENCOUNTER — Ambulatory Visit: Payer: Medicare HMO | Admitting: Hematology

## 2020-07-26 ENCOUNTER — Other Ambulatory Visit: Payer: Self-pay

## 2020-07-26 ENCOUNTER — Ambulatory Visit
Admission: RE | Admit: 2020-07-26 | Discharge: 2020-07-26 | Disposition: A | Discharge status: Home / Self Care | Payer: Medicare HMO | Source: Ambulatory Visit | Attending: Family Medicine | Admitting: Family Medicine

## 2020-07-26 DIAGNOSIS — M8589 Other specified disorders of bone density and structure, multiple sites: Secondary | ICD-10-CM | POA: Diagnosis not present

## 2020-07-26 DIAGNOSIS — M810 Age-related osteoporosis without current pathological fracture: Secondary | ICD-10-CM | POA: Diagnosis not present

## 2020-07-26 DIAGNOSIS — Z780 Asymptomatic menopausal state: Secondary | ICD-10-CM | POA: Diagnosis not present

## 2020-07-26 DIAGNOSIS — E2839 Other primary ovarian failure: Secondary | ICD-10-CM

## 2020-09-06 DIAGNOSIS — R220 Localized swelling, mass and lump, head: Secondary | ICD-10-CM | POA: Diagnosis not present

## 2020-09-06 DIAGNOSIS — M810 Age-related osteoporosis without current pathological fracture: Secondary | ICD-10-CM | POA: Diagnosis not present

## 2020-09-28 DIAGNOSIS — Z90722 Acquired absence of ovaries, bilateral: Secondary | ICD-10-CM | POA: Diagnosis not present

## 2020-09-28 DIAGNOSIS — M810 Age-related osteoporosis without current pathological fracture: Secondary | ICD-10-CM | POA: Diagnosis not present

## 2020-09-28 DIAGNOSIS — Z6823 Body mass index (BMI) 23.0-23.9, adult: Secondary | ICD-10-CM | POA: Diagnosis not present

## 2020-10-04 DIAGNOSIS — D1801 Hemangioma of skin and subcutaneous tissue: Secondary | ICD-10-CM | POA: Diagnosis not present

## 2020-10-04 DIAGNOSIS — L578 Other skin changes due to chronic exposure to nonionizing radiation: Secondary | ICD-10-CM | POA: Diagnosis not present

## 2020-10-04 DIAGNOSIS — L814 Other melanin hyperpigmentation: Secondary | ICD-10-CM | POA: Diagnosis not present

## 2020-10-04 DIAGNOSIS — L821 Other seborrheic keratosis: Secondary | ICD-10-CM | POA: Diagnosis not present

## 2020-10-20 NOTE — Progress Notes (Signed)
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 female who has been self referred to [place] for evaluation and management of newly noted splenomegaly.  Patient does not have many chronic medical comorbidities. 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 She also visited [place], [place], [place] and a few other places last 4 years.  A couple months ago while still in [place] she noticed some fullness in the upper and left upper abdomen and had an ultrasound which showed splenomegaly with the spleen length of 16.6 cm with no overt focal splenic lesions. No overt abdominal adenopathy noted. No free fluid. Liver, gallbladder, pancreas, both kidneys loss of bowel were noted to be within normal limits. There was incidental finding of a 4.2 cm thin-walled unilocular left ovarian cyst with no solid components. Normal uterus and right ovary was seen.  Her blood counts done on 09/02/2015 showed normal hemoglobin of 14 with an MCV of 91, WBC count of 5.9k with normal differential. Platelet clumping was noted with reported count of 103k. Malaria parasite smear was done and no other parasites were noted.  INTERVAL HISTORY:  Ms. Bains is here for management and evaluation of her Splenic marginal zone lymphoma. The patient's last visit with [place] was on 05/03/2020. The pt reports that she is doing well overall.  The pt reports that she has been doing well. She has no new symptoms or concerns. The patient believes she had the flu and tested negative for COVID a few months ago. She is due for the second booster and will be getting this after she returns from her beach trip. She  continues to have the consistent hot flashes.  Lab results today 10/21/2020 of CBC w/diff and CMP is as follows: all values are WNL except for Plt of 135K, Glucose of 65. 10/21/2020 LDH of 188.  On review of systems, pt reports chronic hot flashes and denies fevers, chills, abdominal pain, abdominal distention, changes in bowel and urinary habits, acute skin rashes, acute infection issues, and any other symptoms.   MEDICAL HISTORY:   #1 Dyslipidemia - not on any medications #2 GERD #3 hot flashes menopausal status was previously on Climara patch #4 history of sebaceous cysts. #5 history of recent left breast cysts - aspirated by ultrasound guidance and noted to be negative for cancer. #6 family history of breast cancer in her sister when she was in her 70s. No genetic testing done at that time.  SURGICAL HISTORY:  #1 breast cysts 2 aspiration on the left side . #2 root canal treatments #3 sebaceous cyst excision   SOCIAL HISTORY: Social History   Socioeconomic History   Marital status: Widowed    Spouse name: Not on file   Number of children: Not on file   Years of education: Not on file   Highest education level: Not on file  Occupational History   Not on file  Tobacco Use   Smoking status: Never   Smokeless tobacco: Never  Vaping Use   Vaping Use: Never used  Substance and Sexual Activity   Alcohol use: Yes    Alcohol/week: 1.0 standard drink  Types: 1 Glasses of wine per week    Comment: occasional   Drug use: No   Sexual activity: Not on file  Other Topics Concern   Not on file  Social History Narrative   Not on file   Social Determinants of Health   Financial Resource Strain: Not on file  Food Insecurity: Not on file  Transportation Needs: Not on file  Physical Activity: Not on file  Stress: Not on file  Social Connections: Not on file  Intimate Partner Violence: Not on file    FAMILY HISTORY: Family History  Problem Relation Age of Onset    Breast cancer Sister        early 94's   Breast cancer Maternal Aunt        diagnosed in her 36's    ALLERGIES:  is allergic to amoxicillin-pot clavulanate, medroxyprogesterone acetate, and codeine.  MEDICATIONS:  Current Outpatient Medications  Medication Sig Dispense Refill   cholecalciferol (VITAMIN D3) 25 MCG (1000 UNIT) tablet Take 1,000 Units by mouth daily.     aspirin 81 MG EC tablet Take 81 mg by mouth daily. Swallow whole.     docusate sodium (COLACE) 100 MG capsule Take 1 capsule (100 mg total) by mouth 2 (two) times daily. 30 capsule 0   ibuprofen (ADVIL,MOTRIN) 600 MG tablet Take 1 tablet (600 mg total) by mouth every 6 (six) hours as needed. 30 tablet 0   Misc Natural Products (TART CHERRY ADVANCED PO) Take by mouth.     Omega-3 Fatty Acids (FISH OIL) 1200 MG CAPS Take by mouth.     ondansetron (ZOFRAN) 4 MG tablet Take 1 tablet (4 mg total) by mouth every 8 (eight) hours as needed for nausea or vomiting. 20 tablet 0   venlafaxine XR (EFFEXOR-XR) 37.5 MG 24 hr capsule Take 1 capsule (37.5 mg total) by mouth daily with breakfast. 30 capsule 1   vitamin E 100 UNIT capsule Take by mouth daily.     No current facility-administered medications for this visit.    REVIEW OF SYSTEMS:   10 Point review of Systems was done is negative except as noted above.   PHYSICAL EXAMINATION: ECOG PERFORMANCE STATUS: 0 - Asymptomatic  .BP 108/67 (BP Location: Left Arm, Patient Position: Sitting)   Pulse (!) 53   Temp 98.3 F (36.8 C)   Resp 17   Wt 133 lb 8 oz (60.6 kg)   SpO2 100%   BMI 23.65 kg/m   NAD. GENERAL:alert, in no acute distress and comfortable SKIN: no acute rashes, no significant lesions EYES: conjunctiva are pink and non-injected, sclera anicteric OROPHARYNX: MMM, no exudates, no oropharyngeal erythema or ulceration NECK: supple, no JVD LYMPH:  no palpable lymphadenopathy in the cervical, axillary or inguinal regions LUNGS: clear to auscultation b/l with normal  respiratory effort HEART: regular rate & rhythm ABDOMEN:  normoactive bowel sounds , non tender, not distended. Extremity: no pedal edema PSYCH: alert & oriented x 3 with fluent speech NEURO: no focal motor/sensory deficits   LABORATORY DATA:  I have reviewed the data as listed  . CBC Latest Ref Rng & Units 10/21/2020 05/03/2020 11/02/2019  WBC 4.0 - 10.5 K/uL 4.9 5.4 4.9  Hemoglobin 12.0 - 15.0 g/dL 14.8 14.1 14.3  Hematocrit 36.0 - 46.0 % 44.6 42.2 43.3  Platelets 150 - 400 K/uL 135(L) 130(L) 142(L)   . CBC    Component Value Date/Time   WBC 4.9 10/21/2020 0746   RBC 4.61 10/21/2020 0746   HGB 14.8  10/21/2020 0746   HGB 14.1 05/03/2020 1248   HGB 14.0 03/22/2017 0950   HCT 44.6 10/21/2020 0746   HCT 41.9 03/22/2017 0950   PLT 135 (L) 10/21/2020 0746   PLT 130 (L) 05/03/2020 1248   PLT 136 (L) 03/22/2017 0950   MCV 96.7 10/21/2020 0746   MCV 93.3 03/22/2017 0950   MCH 32.1 10/21/2020 0746   MCHC 33.2 10/21/2020 0746   RDW 12.8 10/21/2020 0746   RDW 15.0 (H) 03/22/2017 0950   LYMPHSABS 1.9 10/21/2020 0746   LYMPHSABS 1.5 03/22/2017 0950   MONOABS 0.4 10/21/2020 0746   MONOABS 0.3 03/22/2017 0950   EOSABS 0.2 10/21/2020 0746   EOSABS 0.1 03/22/2017 0950   BASOSABS 0.0 10/21/2020 0746   BASOSABS 0.0 03/22/2017 0950    . CMP Latest Ref Rng & Units 10/21/2020 05/03/2020 11/02/2019  Glucose 70 - 99 mg/dL 65(L) 84 92  BUN 8 - 23 mg/dL '12 16 12  ' Creatinine 0.44 - 1.00 mg/dL 0.86 0.81 0.83  Sodium 135 - 145 mmol/L 143 138 139  Potassium 3.5 - 5.1 mmol/L 4.1 4.4 4.1  Chloride 98 - 111 mmol/L 107 105 105  CO2 22 - 32 mmol/L '26 26 26  ' Calcium 8.9 - 10.3 mg/dL 9.4 9.3 9.3  Total Protein 6.5 - 8.1 g/dL 6.6 6.6 6.8  Total Bilirubin 0.3 - 1.2 mg/dL 0.6 0.8 0.7  Alkaline Phos 38 - 126 U/L 92 83 86  AST 15 - 41 U/L '24 19 16  ' ALT 0 - 44 U/L '30 22 14   ' . Lab Results  Component Value Date   LDH 188 10/21/2020        Hematopathology Order10/03/2017 [place] BIOPSY 01/25/2017: Interpretation Fourteen of the twenty metaphase cells analyzed are normal (46,XX). Six cells are abnormal and contain a deletion within the long arm of chromosome 7 and additional material of unknown origin attached to the long arm of chromosome 14 at or near the [facility] locus. Deletion of 7q is seen in mature B-cell neoplasms including marginal zone lymphoma. Ten cells were analyzed from the 24 hour unstimulated culture and ten cells were analyzed from the 72 hour culture stimulated with pokeweed mitogen, phorbol 12-myristate 13-acetate (PMA), and a CpG-oligodeoxynucleotide (ODN). The abnormal clone was only seen in the 72 hour stimulated culture.   Component Name Value Ref Range  Case Report Surgical Pathology Report                         Case: [id number]                                Authorizing Provider:  Rulon Sera,  Collected:           01/25/2017 1100                                    AGNP                                                                        Ordering Location:  [facility]      Received:            01/25/2017 [place]                                                                 Pathologist:           Trudee Kuster,                                                                          MD                                                                          Specimens:   A) - Bone Marrow Right - Aspirate                                                                  B) - Bone Marrow Right - Biopsy                                                                    C) - Peripheral Blood                                                                    Final Diagnosis Bone marrow, right iliac, aspiration and biopsy -  Hypercellular bone marrow (70%) with involvement by mature B-cell lymphoma with non-specific immunophenotype (representing approximately  20% of marrow cellularity by immunohistochemical analysis) (See Comment)  -  Cytogenetic studies are pending.     RADIOGRAPHIC STUDIES: I have personally reviewed the radiological images as listed and agreed with the findings in the report. No results found.  ASSESSMENT & PLAN:   70 y.o. female with   1) Splenic marginal zone lymphoma. Presented with splenomegaly and cytopenia with constitutional sypmtoms fatigue and  night sweats - now resolved with Rituxan treatment. HIV, hepatitis C and hepatitis B serologies unrevealing  SPEP shows no M spike and negative IFE.  BM Bx done at [facility] -  Hypercellular bone marrow (70%) with involvement by mature B-cell lymphoma with non-specific immunophenotype (representing approximately 20% of marrow cellularity by immunohistochemical analysis) Cytogenetics 7q deletion  2) Hot flashes grade 2  Patient declines consideration of medications for this.  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 Now resolved  4) Rituxan related Rash - grade 1. Resolved and did not recur with dose 2 and 3 and 4 with adjusted premedications.  5) left ovarian cyst - 4.2 cm . 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 CA-125 levels were 28.6 on the normal range of 0-35 . Alpha-fetoprotein was within normal limits and 1.8 . CEA level was 1.3 . 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 Patient notes that her sister had breast cancer in her 71s . Has not had genetic testing . No other family history of breast cancer ovarian cancer pancreatic cancer or melanoma . She has had recent left breast cysts that were aspirated in [place] and per her report were noted to be benign . -yrly MMG with PCP (last done 12/31/2017) - negative   8) history of depression was previously on Lexapro currently not on any medications .  Lost her husband 2009 to throat cancer .  PLAN: -Discussed pt labwork today, 10/20/2020; LDH normal, chemistries normal, mild thrombocytopenia. -Recommended pt discuss other options for her hot flashes with PCP. -No lab or clinical evidence of recurrence/progression of pt's Splenic Marginal Zone Lymphoma at this time. -Will see back in 6 months with labs.   FOLLOW UP: RTC with Dr Irene Limbo in 6 months with labs     The total time spent in the appointment was 20 minutes and more than 50% was on counseling and direct patient cares.   Sullivan Lone MD [place] AAHIVMS [facility] Hematology/Oncology Physician [facility]  (Office):       [phone] (Work cell):  [phone] (Fax):           [phone]    I, Reinaldo Raddle, am acting as scribe for Dr. Sullivan Lone, MD.   .I have reviewed the above documentation for accuracy and completeness, and I agree with the above. Brunetta Genera MD

## 2020-10-21 ENCOUNTER — Inpatient Hospital Stay: Payer: Medicare HMO | Attending: Hematology

## 2020-10-21 ENCOUNTER — Inpatient Hospital Stay: Payer: Medicare HMO | Admitting: Hematology

## 2020-10-21 ENCOUNTER — Other Ambulatory Visit: Payer: Self-pay

## 2020-10-21 VITALS — BP 108/67 | HR 53 | Temp 98.3°F | Resp 17 | Wt 133.5 lb

## 2020-10-21 DIAGNOSIS — Z803 Family history of malignant neoplasm of breast: Secondary | ICD-10-CM | POA: Insufficient documentation

## 2020-10-21 DIAGNOSIS — D6959 Other secondary thrombocytopenia: Secondary | ICD-10-CM | POA: Diagnosis not present

## 2020-10-21 DIAGNOSIS — C8307 Small cell B-cell lymphoma, spleen: Secondary | ICD-10-CM

## 2020-10-21 LAB — CMP (CANCER CENTER ONLY)
ALT: 30 U/L (ref 0–44)
AST: 24 U/L (ref 15–41)
Albumin: 3.9 g/dL (ref 3.5–5.0)
Alkaline Phosphatase: 92 U/L (ref 38–126)
Anion gap: 10 (ref 5–15)
BUN: 12 mg/dL (ref 8–23)
CO2: 26 mmol/L (ref 22–32)
Calcium: 9.4 mg/dL (ref 8.9–10.3)
Chloride: 107 mmol/L (ref 98–111)
Creatinine: 0.86 mg/dL (ref 0.44–1.00)
GFR, Estimated: >60 mL/min (ref >60)
Glucose, Bld: 65 mg/dL — ABNORMAL LOW (ref 70–99)
Potassium: 4.1 mmol/L (ref 3.5–5.1)
Sodium: 143 mmol/L (ref 135–145)
Total Bilirubin: 0.6 mg/dL (ref 0.3–1.2)
Total Protein: 6.6 g/dL (ref 6.5–8.1)

## 2020-10-21 LAB — CBC WITH DIFFERENTIAL/PLATELET
Abs Immature Granulocytes: 0.01 10*3/uL (ref 0.00–0.07)
Basophils Absolute: 0 10*3/uL (ref 0.0–0.1)
Basophils Relative: 0 %
Eosinophils Absolute: 0.2 10*3/uL (ref 0.0–0.5)
Eosinophils Relative: 4 %
HCT: 44.6 % (ref 36.0–46.0)
Hemoglobin: 14.8 g/dL (ref 12.0–15.0)
Immature Granulocytes: 0 %
Lymphocytes Relative: 39 %
Lymphs Abs: 1.9 10*3/uL (ref 0.7–4.0)
MCH: 32.1 pg (ref 26.0–34.0)
MCHC: 33.2 g/dL (ref 30.0–36.0)
MCV: 96.7 fL (ref 80.0–100.0)
Monocytes Absolute: 0.4 10*3/uL (ref 0.1–1.0)
Monocytes Relative: 7 %
Neutro Abs: 2.5 10*3/uL (ref 1.7–7.7)
Neutrophils Relative %: 50 %
Platelets: 135 10*3/uL — ABNORMAL LOW (ref 150–400)
RBC: 4.61 MIL/uL (ref 3.87–5.11)
RDW: 12.8 % (ref 11.5–15.5)
WBC: 4.9 10*3/uL (ref 4.0–10.5)
nRBC: 0 % (ref 0.0–0.2)

## 2020-10-21 LAB — LACTATE DEHYDROGENASE: LDH: 188 U/L (ref 98–192)

## 2020-10-25 ENCOUNTER — Telehealth: Payer: Self-pay | Admitting: Hematology

## 2020-10-25 NOTE — Telephone Encounter (Signed)
Scheduled follow-up appointment per 7/8 los. Patient is aware. 

## 2020-11-01 ENCOUNTER — Ambulatory Visit: Payer: Medicare HMO | Admitting: Hematology

## 2020-11-01 ENCOUNTER — Other Ambulatory Visit: Payer: Medicare HMO

## 2020-11-18 DIAGNOSIS — G43B0 Ophthalmoplegic migraine, not intractable: Secondary | ICD-10-CM | POA: Diagnosis not present

## 2020-11-18 DIAGNOSIS — Z0100 Encounter for examination of eyes and vision without abnormal findings: Secondary | ICD-10-CM | POA: Diagnosis not present

## 2020-11-18 DIAGNOSIS — H40003 Preglaucoma, unspecified, bilateral: Secondary | ICD-10-CM | POA: Diagnosis not present

## 2020-11-18 DIAGNOSIS — H43393 Other vitreous opacities, bilateral: Secondary | ICD-10-CM | POA: Diagnosis not present

## 2020-11-18 DIAGNOSIS — H2513 Age-related nuclear cataract, bilateral: Secondary | ICD-10-CM | POA: Diagnosis not present

## 2020-12-02 DIAGNOSIS — R161 Splenomegaly, not elsewhere classified: Secondary | ICD-10-CM | POA: Diagnosis not present

## 2020-12-02 DIAGNOSIS — E785 Hyperlipidemia, unspecified: Secondary | ICD-10-CM | POA: Diagnosis not present

## 2020-12-02 DIAGNOSIS — Z8579 Personal history of other malignant neoplasms of lymphoid, hematopoietic and related tissues: Secondary | ICD-10-CM | POA: Diagnosis not present

## 2020-12-02 DIAGNOSIS — D696 Thrombocytopenia, unspecified: Secondary | ICD-10-CM | POA: Diagnosis not present

## 2020-12-02 DIAGNOSIS — N951 Menopausal and female climacteric states: Secondary | ICD-10-CM | POA: Diagnosis not present

## 2020-12-02 DIAGNOSIS — M810 Age-related osteoporosis without current pathological fracture: Secondary | ICD-10-CM | POA: Diagnosis not present

## 2020-12-29 ENCOUNTER — Other Ambulatory Visit: Payer: Self-pay | Admitting: Family Medicine

## 2020-12-29 DIAGNOSIS — Z1231 Encounter for screening mammogram for malignant neoplasm of breast: Secondary | ICD-10-CM

## 2021-01-17 ENCOUNTER — Other Ambulatory Visit: Payer: Self-pay

## 2021-01-17 ENCOUNTER — Ambulatory Visit
Admission: RE | Admit: 2021-01-17 | Discharge: 2021-01-17 | Disposition: A | Discharge status: Home / Self Care | Payer: Medicare HMO | Source: Ambulatory Visit | Attending: Family Medicine | Admitting: Family Medicine

## 2021-01-17 DIAGNOSIS — Z1231 Encounter for screening mammogram for malignant neoplasm of breast: Secondary | ICD-10-CM | POA: Diagnosis not present

## 2021-02-07 ENCOUNTER — Ambulatory Visit: Payer: Medicare HMO

## 2021-04-21 ENCOUNTER — Other Ambulatory Visit (HOSPITAL_COMMUNITY): Payer: Self-pay | Admitting: Family Medicine

## 2021-04-24 ENCOUNTER — Inpatient Hospital Stay: Payer: Medicare HMO

## 2021-04-24 ENCOUNTER — Other Ambulatory Visit: Payer: Self-pay

## 2021-04-24 ENCOUNTER — Inpatient Hospital Stay: Payer: Medicare HMO | Attending: Hematology | Admitting: Hematology

## 2021-04-24 VITALS — BP 117/54 | HR 52 | Temp 97.7°F | Resp 18 | Wt 137.9 lb

## 2021-04-24 DIAGNOSIS — Z803 Family history of malignant neoplasm of breast: Secondary | ICD-10-CM | POA: Insufficient documentation

## 2021-04-24 DIAGNOSIS — C8307 Small cell B-cell lymphoma, spleen: Secondary | ICD-10-CM

## 2021-04-24 DIAGNOSIS — C8300 Small cell B-cell lymphoma, unspecified site: Secondary | ICD-10-CM | POA: Diagnosis not present

## 2021-04-24 DIAGNOSIS — R232 Flushing: Secondary | ICD-10-CM | POA: Diagnosis not present

## 2021-04-24 DIAGNOSIS — R1013 Epigastric pain: Secondary | ICD-10-CM | POA: Diagnosis not present

## 2021-04-24 DIAGNOSIS — D696 Thrombocytopenia, unspecified: Secondary | ICD-10-CM | POA: Insufficient documentation

## 2021-04-24 DIAGNOSIS — E785 Hyperlipidemia, unspecified: Secondary | ICD-10-CM | POA: Insufficient documentation

## 2021-04-24 LAB — CMP (CANCER CENTER ONLY)
ALT: 18 U/L (ref 0–44)
AST: 17 U/L (ref 15–41)
Albumin: 4.4 g/dL (ref 3.5–5.0)
Alkaline Phosphatase: 65 U/L (ref 38–126)
Anion gap: 7 (ref 5–15)
BUN: 13 mg/dL (ref 8–23)
CO2: 28 mmol/L (ref 22–32)
Calcium: 9.3 mg/dL (ref 8.9–10.3)
Chloride: 104 mmol/L (ref 98–111)
Creatinine: 0.85 mg/dL (ref 0.44–1.00)
GFR, Estimated: >60 mL/min (ref >60)
Glucose, Bld: 88 mg/dL (ref 70–99)
Potassium: 4.1 mmol/L (ref 3.5–5.1)
Sodium: 139 mmol/L (ref 135–145)
Total Bilirubin: 0.6 mg/dL (ref 0.3–1.2)
Total Protein: 6.6 g/dL (ref 6.5–8.1)

## 2021-04-24 LAB — CBC WITH DIFFERENTIAL (CANCER CENTER ONLY)
Abs Immature Granulocytes: 0.01 K/uL (ref 0.00–0.07)
Basophils Absolute: 0 K/uL (ref 0.0–0.1)
Basophils Relative: 1 %
Eosinophils Absolute: 0.2 K/uL (ref 0.0–0.5)
Eosinophils Relative: 3 %
HCT: 43.4 % (ref 36.0–46.0)
Hemoglobin: 14.3 g/dL (ref 12.0–15.0)
Immature Granulocytes: 0 %
Lymphocytes Relative: 44 %
Lymphs Abs: 2.3 K/uL (ref 0.7–4.0)
MCH: 31.1 pg (ref 26.0–34.0)
MCHC: 32.9 g/dL (ref 30.0–36.0)
MCV: 94.3 fL (ref 80.0–100.0)
Monocytes Absolute: 0.3 K/uL (ref 0.1–1.0)
Monocytes Relative: 6 %
Neutro Abs: 2.4 K/uL (ref 1.7–7.7)
Neutrophils Relative %: 46 %
Platelet Count: 124 K/uL — ABNORMAL LOW (ref 150–400)
RBC: 4.6 MIL/uL (ref 3.87–5.11)
RDW: 12.8 % (ref 11.5–15.5)
WBC Count: 5.3 K/uL (ref 4.0–10.5)
nRBC: 0 % (ref 0.0–0.2)

## 2021-04-24 LAB — LACTATE DEHYDROGENASE: LDH: 172 U/L (ref 98–192)

## 2021-04-24 NOTE — Progress Notes (Signed)
[name]    HEMATOLOGY/ONCOLOGY CLINIC NOTE  Date of Service: 04/24/21   Patient Care Team: London Pepper, MD as PCP - General (Family Medicine)  CHIEF COMPLAINTS/PURPOSE OF CONSULTATION:   Follow-up for management of splenic marginal zone lymphoma  HISTORY OF PRESENTING ILLNESS:   Please see previous notes for details on initial presentation  INTERVAL HISTORY:  [name] is here for continued evaluation and management of her splenic marginal zone lymphoma.  She was last seen by [place] about 6 months ago.  She notes that she has been doing well overall. No fevers no chills no night sweats no new fatigue no weight loss. She notes a dull bruise-like ache over her left costal margin with mild tenderness to palpation. She also notes some increase in discomfort with eating and some early satiety. No other acute abdominal discomfort. No change in bowel habits.  No evidence of GI bleeding. No hematuria. Patient notes that the pain does not keep her from doing her daily activities and has not really worsened over the last several months.  She does have some acid reflux symptoms.  MEDICAL HISTORY:   #1 Dyslipidemia - not on any medications #2 GERD #3 hot flashes menopausal status was previously on Climara patch #4 history of sebaceous cysts. #5 history of recent left breast cysts - aspirated by ultrasound guidance and noted to be negative for cancer. #6 family history of breast cancer in her sister when she was in her 6s. No genetic testing done at that time.  SURGICAL HISTORY:  #1 breast cysts 2 aspiration on the left side . #2 root canal treatments #3 sebaceous cyst excision   SOCIAL HISTORY: Social History   Socioeconomic History   Marital status: Widowed    Spouse name: Not on file   Number of children: Not on file   Years of education: Not on file   Highest education level: Not on file  Occupational History   Not on file  Tobacco Use   Smoking status: Never   Smokeless  tobacco: Never  Vaping Use   Vaping Use: Never used  Substance and Sexual Activity   Alcohol use: Yes    Alcohol/week: 1.0 standard drink    Types: 1 Glasses of wine per week    Comment: occasional   Drug use: No   Sexual activity: Not on file  Other Topics Concern   Not on file  Social History Narrative   Not on file   Social Determinants of Health   Financial Resource Strain: Not on file  Food Insecurity: Not on file  Transportation Needs: Not on file  Physical Activity: Not on file  Stress: Not on file  Social Connections: Not on file  Intimate Partner Violence: Not on file    FAMILY HISTORY: Family History  Problem Relation Age of Onset   Breast cancer Sister        early 56's   Breast cancer Maternal Aunt        diagnosed in her 28's    ALLERGIES:  is allergic to amoxicillin-pot clavulanate, medroxyprogesterone acetate, and codeine.  MEDICATIONS:  Current Outpatient Medications  Medication Sig Dispense Refill   aspirin 81 MG EC tablet Take 81 mg by mouth daily. Swallow whole.     cholecalciferol (VITAMIN D3) 25 MCG (1000 UNIT) tablet Take 1,000 Units by mouth daily.     docusate sodium (COLACE) 100 MG capsule Take 1 capsule (100 mg total) by mouth 2 (two) times daily. 30 capsule 0  ibuprofen (ADVIL,MOTRIN) 600 MG tablet Take 1 tablet (600 mg total) by mouth every 6 (six) hours as needed. 30 tablet 0   Misc Natural Products (TART CHERRY ADVANCED PO) Take by mouth.     Omega-3 Fatty Acids (FISH OIL) 1200 MG CAPS Take by mouth.     ondansetron (ZOFRAN) 4 MG tablet Take 1 tablet (4 mg total) by mouth every 8 (eight) hours as needed for nausea or vomiting. 20 tablet 0   venlafaxine XR (EFFEXOR-XR) 37.5 MG 24 hr capsule Take 1 capsule (37.5 mg total) by mouth daily with breakfast. 30 capsule 1   vitamin E 100 UNIT capsule Take by mouth daily.     No current facility-administered medications for this visit.    REVIEW OF SYSTEMS:   .10 Point review of Systems was  done is negative except as noted above.  PHYSICAL EXAMINATION: ECOG PERFORMANCE STATUS: 0 - Asymptomatic  .BP (!) 117/54    Pulse (!) 52    Temp 97.7 F (36.5 C)    Resp 18    Wt 137 lb 14.4 oz (62.6 kg)    SpO2 99%    BMI 24.43 kg/m  NAD GENERAL:alert, in no acute distress and comfortable SKIN: no acute rashes, no significant lesions EYES: conjunctiva are pink and non-injected, sclera anicteric OROPHARYNX: MMM, no exudates, no oropharyngeal erythema or ulceration NECK: supple, no JVD LYMPH:  no palpable lymphadenopathy in the cervical, axillary or inguinal regions LUNGS: clear to auscultation b/l with normal respiratory effort HEART: regular rate & rhythm ABDOMEN:  normoactive bowel sounds , not distended.  No palpable hepatosplenomegaly, mild tenderness over left costal margin and epigastric region to deep palpation. Extremity: no pedal edema PSYCH: alert & oriented x 3 with fluent speech NEURO: no focal motor/sensory deficits    LABORATORY DATA:  I have reviewed the data as listed  . CBC Latest Ref Rng & Units 04/24/2021 10/21/2020 05/03/2020  WBC 4.0 - 10.5 K/uL 5.3 4.9 5.4  Hemoglobin 12.0 - 15.0 g/dL 14.3 14.8 14.1  Hematocrit 36.0 - 46.0 % 43.4 44.6 42.2  Platelets 150 - 400 K/uL 124(L) 135(L) 130(L)   . CBC    Component Value Date/Time   WBC 5.3 04/24/2021 0818   WBC 4.9 10/21/2020 0746   RBC 4.60 04/24/2021 0818   HGB 14.3 04/24/2021 0818   HGB 14.0 03/22/2017 0950   HCT 43.4 04/24/2021 0818   HCT 41.9 03/22/2017 0950   PLT 124 (L) 04/24/2021 0818   PLT 136 (L) 03/22/2017 0950   MCV 94.3 04/24/2021 0818   MCV 93.3 03/22/2017 0950   MCH 31.1 04/24/2021 0818   MCHC 32.9 04/24/2021 0818   RDW 12.8 04/24/2021 0818   RDW 15.0 (H) 03/22/2017 0950   LYMPHSABS 2.3 04/24/2021 0818   LYMPHSABS 1.5 03/22/2017 0950   MONOABS 0.3 04/24/2021 0818   MONOABS 0.3 03/22/2017 0950   EOSABS 0.2 04/24/2021 0818   EOSABS 0.1 03/22/2017 0950   BASOSABS 0.0 04/24/2021 0818    BASOSABS 0.0 03/22/2017 0950    . CMP Latest Ref Rng & Units 04/24/2021 10/21/2020 05/03/2020  Glucose 70 - 99 mg/dL 88 65(L) 84  BUN 8 - 23 mg/dL '13 12 16  ' Creatinine 0.44 - 1.00 mg/dL 0.85 0.86 0.81  Sodium 135 - 145 mmol/L 139 143 138  Potassium 3.5 - 5.1 mmol/L 4.1 4.1 4.4  Chloride 98 - 111 mmol/L 104 107 105  CO2 22 - 32 mmol/L '28 26 26  ' Calcium 8.9 - 10.3 mg/dL 9.3  9.4 9.3  Total Protein 6.5 - 8.1 g/dL 6.6 6.6 6.6  Total Bilirubin 0.3 - 1.2 mg/dL 0.6 0.6 0.8  Alkaline Phos 38 - 126 U/L 65 92 83  AST 15 - 41 U/L '17 24 19  ' ALT 0 - 44 U/L '18 30 22   ' . Lab Results  Component Value Date   LDH 188 10/21/2020        Hematopathology Order10/03/2017 [place] BIOPSY 01/25/2017: Interpretation Fourteen of the twenty metaphase cells analyzed are normal (46,XX). Six cells are abnormal and contain a deletion within the long arm of chromosome 7 and additional material of unknown origin attached to the long arm of chromosome 14 at or near the [facility] locus. Deletion of 7q is seen in mature B-cell neoplasms including marginal zone lymphoma. Ten cells were analyzed from the 24 hour unstimulated culture and ten cells were analyzed from the 72 hour culture stimulated with pokeweed mitogen, phorbol 12-myristate 13-acetate (PMA), and a CpG-oligodeoxynucleotide (ODN). The abnormal clone was only seen in the 72 hour stimulated culture.   Component Name Value Ref Range  Case Report Surgical Pathology Report                         Case: [id number]                                Authorizing Provider:  Rulon Sera,  Collected:           01/25/2017 1100                                    AGNP                                                                        Ordering Location:     [facility]      Received:            01/25/2017 [place]                                                                 Pathologist:            Trudee Kuster,                                                                          MD  Specimens:   A) - Bone Marrow Right - Aspirate                                                                  B) - Bone Marrow Right - Biopsy                                                                    C) - Peripheral Blood                                                                    Final Diagnosis Bone marrow, right iliac, aspiration and biopsy -  Hypercellular bone marrow (70%) with involvement by mature B-cell lymphoma with non-specific immunophenotype (representing approximately 20% of marrow cellularity by immunohistochemical analysis) (See Comment)  -  Cytogenetic studies are pending.     RADIOGRAPHIC STUDIES: I have personally reviewed the radiological images as listed and agreed with the findings in the report. No results found.  ASSESSMENT & PLAN:   71 y.o. female with   1) history of splenic marginal zone lymphoma. Presented with splenomegaly and cytopenia with constitutional sypmtoms fatigue and night sweats - now resolved with Rituxan treatment. HIV, hepatitis C and hepatitis B serologies unrevealing  SPEP shows no M spike and negative IFE.  BM Bx done at [facility] -  Hypercellular bone marrow (70%) with involvement by mature B-cell lymphoma with non-specific immunophenotype (representing approximately 20% of marrow cellularity by immunohistochemical analysis) Cytogenetics 7q deletion  2) Hot flashes grade 2  Patient declines consideration of medications for this.  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  Minimal and stable.  4) history of Rituxan related Rash - grade 1. Resolved and did not recur with dose 2 and 3 and 4 with adjusted premedications.  5) left ovarian serous cystoadenoma status post surgery  6) family history of breast  cancer . Patient notes that her sister had breast cancer in her 65s . Has not had genetic testing . No other family history of breast cancer ovarian cancer pancreatic cancer or melanoma . She has had recent left breast cysts that were aspirated in [place] and per her report were noted to be benign . -yrly MMG with PCP (last done 12/31/2017) - negative   7) history of depression was previously on Lexapro currently not on any medications . Lost her husband 2009 to throat cancer .  PLAN: -Discussed labwork today CBC is stable with mild chronic thrombocytopenia.  Normal hemoglobin and WBC count no lymphocytosis. CMP within normal limits LDH within normal limits at 172 -Patient has lab or clinical findings suggestive of splenic marginal zone lymphoma progression at this time. -She has some mild discomfort in her epigastric area and left upper quadrant which seems to increase  with p.o. intake.  Also notes some early satiety.  Could be from GERD and gastritis. -Offered patient option to do CT abdomen and consider GI referral for EGD -she would like to hold off on this currently. -Recommended she start taking omeprazole 20 mg p.o. daily over-the-counter for 2 to 4 weeks to see if the symptoms resolve. -She should call us or her primary care physician if the symptoms persist or worsen or if she decides to get the CT abdomen and GI referral. -We will followed by a note to her primary care physician to follow-up on her symptoms and consider GI referral.  FOLLOW UP: return to clinic with Dr. Irene Limbo with labs in 6 months  Sullivan Lone MD [place] [facility] Hematology/Oncology Physician [facility]

## 2021-04-25 ENCOUNTER — Telehealth: Payer: Self-pay | Admitting: Hematology

## 2021-04-25 NOTE — Telephone Encounter (Signed)
Scheduled follow-up appointment per 1/9 los. Patient is aware. °

## 2021-04-28 ENCOUNTER — Other Ambulatory Visit: Payer: Self-pay

## 2021-04-28 ENCOUNTER — Ambulatory Visit (HOSPITAL_COMMUNITY)
Admission: RE | Admit: 2021-04-28 | Discharge: 2021-04-28 | Disposition: A | Discharge status: Home / Self Care | Payer: Self-pay | Source: Ambulatory Visit | Attending: Family Medicine | Admitting: Family Medicine

## 2021-04-28 DIAGNOSIS — I700 Atherosclerosis of aorta: Secondary | ICD-10-CM | POA: Insufficient documentation

## 2021-04-28 DIAGNOSIS — Z136 Encounter for screening for cardiovascular disorders: Secondary | ICD-10-CM | POA: Insufficient documentation

## 2021-06-06 ENCOUNTER — Ambulatory Visit (FREE_STANDING_LABORATORY_FACILITY): Payer: Medicare (Managed Care) | Admitting: Nurse Practitioner

## 2021-06-06 ENCOUNTER — Encounter (INDEPENDENT_AMBULATORY_CARE_PROVIDER_SITE_OTHER): Payer: Self-pay

## 2021-06-06 VITALS — BP 138/76 | HR 61 | Temp 101.1°F | Resp 18 | Ht 62.0 in | Wt 140.0 lb

## 2021-06-06 DIAGNOSIS — N3090 Cystitis, unspecified without hematuria: Secondary | ICD-10-CM

## 2021-06-06 DIAGNOSIS — R300 Dysuria: Secondary | ICD-10-CM

## 2021-06-06 LAB — MCKESSON POCT UA 120
Bilirubin UA: NEGATIVE
Glucose UA: NEGATIVE
Ketone UA: NEGATIVE
Nitrite UA: NEGATIVE
Protein UA: NEGATIVE
Specific Gravity UA: <=1.005
Urobilinogen UA: NEGATIVE
pH UA: 5.5

## 2021-06-06 MED ORDER — CIPROFLOXACIN HCL 250 MG PO TABS
250.0000 mg | ORAL_TABLET | Freq: Two times a day (BID) | ORAL | 0 refills | Status: AC
Start: 2021-06-06 — End: 2021-06-11

## 2021-06-06 MED ORDER — PHENAZOPYRIDINE HCL 100 MG PO TABS
100.0000 mg | ORAL_TABLET | Freq: Three times a day (TID) | ORAL | 0 refills | Status: AC | PRN
Start: 2021-06-06 — End: ?

## 2021-06-06 NOTE — Progress Notes (Signed)
Urgent Care Provider Note    Patient: Margaret Malone   Date: 06/06/2021   MRN: [id number]       Subjective     Chief Complaint   Patient presents with    Urinary Tract Infection Symptoms     71 yo female c/o UTI with lower abdominal pain that started a week ago but got worse this morning with some headache.        History of Present illness:  HPI    Margaret Malone is a 71 y.o. female who presents with mild and moderate which started 1 week ago and has been stable. Patient admits to dysuria, urinary frequency, urinary urgency, and fever . Patient denies fever , chills, vomiting , vaginal discharge, and abnormal vaginal bleeding. LMP unknown.  UTI 10 days ago  No LMP recorded.    Pertinent Past Medical, Surgical, Family and Social History were reviewed.      Current Outpatient Medications:     ciprofloxacin (CIPRO) 250 MG tablet, Take 1 tablet (250 mg) by mouth 2 (two) times daily for 5 days, Disp: 10 tablet, Rfl: 0    phenazopyridine (PYRIDIUM) 100 MG tablet, Take 1 tablet (100 mg) by mouth 3 (three) times daily as needed for Pain, Disp: 6 tablet, Rfl: 0    No Known Allergies    Medications and Allergies reviewed.         Objective     Vitals:    06/06/21 1324   BP: 138/76   Pulse: 61   Resp: 18   Temp: (!) 101.1 F (38.4 C)   SpO2: 99%       Physical Exam    General: no acute distress, well developed, well nourished.    Heart: regular rate    Lungs: normal work of breathing    Abdomen: soft, normal bowel sounds, non tender, no masses, and no CVA tenderness     Back: No CVA tenderness    UCC COURSE  LABS  The following POCT tests were ordered, reviewed and discussed with the patient/family.     Results       Procedure Component Value Units Date/Time    UA [[id number]]  (Abnormal) Collected: 06/06/21 1339    Specimen: Clean Catch Updated: 06/06/21 1342     Color, UA Light Yellow     Clarity, UA Clear     Leukocytes UA 2+ (125 Leu/ul)     Nitrite UA Negative     Urobilinogen UA Negative (0.2 mg/dl)     Protein UA Negative     pH  UA 5.5     Blood UA 3+ (200 Ery/ul)     Specific Gravity UA <=1.005     Ketone UA Negative     Bilirubin UA Negative     Glucose UA Negative            There were no x-rays reviewed with this patient during the visit.    No current facility-administered medications for this visit.             PROCEDURES:  Procedures    MDM:  F/u urine cx drink plenty of liquids.   DDX renal stone UTI pyelonephritis     Assessment     Margaret Malone was seen today for urinary tract infection symptoms.    Diagnoses and all orders for this visit:    Bladder infection  -     Urine culture    Dysuria  -     UA  Other orders  -     ciprofloxacin (CIPRO) 250 MG tablet; Take 1 tablet (250 mg) by mouth 2 (two) times daily for 5 days  -     phenazopyridine (PYRIDIUM) 100 MG tablet; Take 1 tablet (100 mg) by mouth 3 (three) times daily as needed for Pain        Plan and follow-up discussed with patient. See AVS for further documentation.

## 2021-10-23 ENCOUNTER — Telehealth: Payer: Self-pay | Admitting: Hematology

## 2021-10-23 NOTE — Telephone Encounter (Signed)
Left message with rescheduled upcoming appointment due to provider's PAL. 

## 2021-10-30 ENCOUNTER — Inpatient Hospital Stay: Payer: Medicare HMO | Admitting: Hematology

## 2021-10-30 ENCOUNTER — Inpatient Hospital Stay: Payer: Medicare HMO

## 2021-12-11 ENCOUNTER — Other Ambulatory Visit: Payer: Self-pay

## 2021-12-11 DIAGNOSIS — C8307 Small cell B-cell lymphoma, spleen: Secondary | ICD-10-CM

## 2021-12-12 ENCOUNTER — Other Ambulatory Visit: Payer: Self-pay

## 2021-12-12 ENCOUNTER — Inpatient Hospital Stay: Payer: Medicare HMO | Attending: Hematology

## 2021-12-12 ENCOUNTER — Inpatient Hospital Stay (HOSPITAL_BASED_OUTPATIENT_CLINIC_OR_DEPARTMENT_OTHER): Payer: Medicare HMO | Admitting: Hematology

## 2021-12-12 VITALS — BP 123/62 | HR 56 | Temp 97.9°F | Resp 14 | Wt 135.9 lb

## 2021-12-12 DIAGNOSIS — C884 Extranodal marginal zone B-cell lymphoma of mucosa-associated lymphoid tissue [MALT-lymphoma]: Secondary | ICD-10-CM | POA: Diagnosis present

## 2021-12-12 DIAGNOSIS — C8307 Small cell B-cell lymphoma, spleen: Secondary | ICD-10-CM

## 2021-12-12 DIAGNOSIS — R1030 Lower abdominal pain, unspecified: Secondary | ICD-10-CM | POA: Diagnosis not present

## 2021-12-12 LAB — CBC WITH DIFFERENTIAL (CANCER CENTER ONLY)
Abs Immature Granulocytes: 0.01 10*3/uL (ref 0.00–0.07)
Basophils Absolute: 0 10*3/uL (ref 0.0–0.1)
Basophils Relative: 1 %
Eosinophils Absolute: 0.1 10*3/uL (ref 0.0–0.5)
Eosinophils Relative: 3 %
HCT: 43.7 % (ref 36.0–46.0)
Hemoglobin: 15 g/dL (ref 12.0–15.0)
Immature Granulocytes: 0 %
Lymphocytes Relative: 38 %
Lymphs Abs: 1.8 10*3/uL (ref 0.7–4.0)
MCH: 32.5 pg (ref 26.0–34.0)
MCHC: 34.3 g/dL (ref 30.0–36.0)
MCV: 94.6 fL (ref 80.0–100.0)
Monocytes Absolute: 0.3 10*3/uL (ref 0.1–1.0)
Monocytes Relative: 7 %
Neutro Abs: 2.5 10*3/uL (ref 1.7–7.7)
Neutrophils Relative %: 51 %
Platelet Count: 103 10*3/uL — ABNORMAL LOW (ref 150–400)
RBC: 4.62 MIL/uL (ref 3.87–5.11)
RDW: 13 % (ref 11.5–15.5)
WBC Count: 4.8 10*3/uL (ref 4.0–10.5)
nRBC: 0 % (ref 0.0–0.2)

## 2021-12-12 LAB — CMP (CANCER CENTER ONLY)
ALT: 14 U/L (ref 0–44)
AST: 18 U/L (ref 15–41)
Albumin: 4.6 g/dL (ref 3.5–5.0)
Alkaline Phosphatase: 67 U/L (ref 38–126)
Anion gap: 8 (ref 5–15)
BUN: 15 mg/dL (ref 8–23)
CO2: 26 mmol/L (ref 22–32)
Calcium: 9.7 mg/dL (ref 8.9–10.3)
Chloride: 109 mmol/L (ref 98–111)
Creatinine: 0.81 mg/dL (ref 0.44–1.00)
GFR, Estimated: >60 mL/min (ref >60)
Glucose, Bld: 96 mg/dL (ref 70–99)
Potassium: 3.7 mmol/L (ref 3.5–5.1)
Sodium: 143 mmol/L (ref 135–145)
Total Bilirubin: 0.6 mg/dL (ref 0.3–1.2)
Total Protein: 6.6 g/dL (ref 6.5–8.1)

## 2021-12-12 LAB — LACTATE DEHYDROGENASE: LDH: 204 U/L — ABNORMAL HIGH (ref 98–192)

## 2021-12-12 NOTE — Progress Notes (Signed)
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 female here for continued evaluation and management of her splenic marginal zone lymphoma. She was last seen by [place] about 7 months ago. She reports She is doing well with no new symptoms or concerns.  She reports intermittent grade 2 fatigue when moving and notes she needs to sit down from time to time before moving again. We discussed starting Vitamin D 2000 units per day.  She notes hot flashes every hour and drenching night sweats. She notes that this prevents her from sleeping well and we discussed that this is likely contributing to her fatigue. We discussed trying Vitamin E and evening primrose tea to help with the hot flashes in addition to maintaining proper hydration.  She notes appointment with urogynecology 01/23/2022 for prolapsed uterus.  She reports abdominal pain while eating and early satiety. We discussed getting a CT scan to evaluate this which she is agreeable to.  No fever or chills. No new or unexpected weight loss. No other acute abdominal discomfort. No hematuria. No change in bowel habits or signs of GI bleeding. No black or bloody stools. No other new or acute focal symptoms.  Labs done today were reviewed in detail.  MEDICAL HISTORY:   #1 Dyslipidemia - not on any medications #2 GERD #3 hot flashes menopausal status was previously on Climara patch #4 history of sebaceous cysts. #5 history of recent left breast cysts - aspirated by ultrasound guidance and noted to be negative for cancer. #6 family history of breast cancer in her sister when she was in her 2s. No genetic  testing done at that time.  SURGICAL HISTORY:  #1 breast cysts 2 aspiration on the left side . #2 root canal treatments #3 sebaceous cyst excision   SOCIAL HISTORY: Social History   Socioeconomic History   Marital status: Widowed    Spouse name: Not on file   Number of children: Not on file   Years of education: Not on file   Highest education level: Not on file  Occupational History   Not on file  Tobacco Use   Smoking status: Never   Smokeless tobacco: Never  Vaping Use   Vaping Use: Never used  Substance and Sexual Activity   Alcohol use: Yes    Alcohol/week: 1.0 standard drink of alcohol    Types: 1 Glasses of wine per week    Comment: occasional   Drug use: No   Sexual activity: Not on file  Other Topics Concern   Not on file  Social History Narrative   Not on file   Social Determinants of Health   Financial Resource Strain: Not on file  Food Insecurity: Not on file  Transportation Needs: Not on file  Physical Activity: Not on file  Stress: Not on file  Social Connections: Not on file  Intimate Partner Violence: Not on file    FAMILY HISTORY: Family History  Problem Relation Age of Onset   Breast cancer Sister        early 81's   Breast cancer Maternal Aunt        diagnosed in her 63's    ALLERGIES:  is allergic to amoxicillin-pot clavulanate, medroxyprogesterone acetate, and codeine.  MEDICATIONS:  Current Outpatient Medications  Medication Sig Dispense Refill   aspirin 81 MG EC tablet Take 81 mg by mouth daily. Swallow whole.     cholecalciferol (VITAMIN D3) 25 MCG (1000 UNIT) tablet Take 1,000 Units by mouth daily.     docusate sodium (COLACE) 100 MG capsule Take 1 capsule (100 mg total) by mouth 2 (two) times daily. 30 capsule 0   ibuprofen (ADVIL,MOTRIN) 600 MG tablet Take 1 tablet (600 mg total) by mouth every 6 (six) hours as needed. 30 tablet 0   Misc Natural Products (TART CHERRY ADVANCED PO) Take by mouth.     Omega-3 Fatty Acids  (FISH OIL) 1200 MG CAPS Take by mouth.     ondansetron (ZOFRAN) 4 MG tablet Take 1 tablet (4 mg total) by mouth every 8 (eight) hours as needed for nausea or vomiting. 20 tablet 0   venlafaxine XR (EFFEXOR-XR) 37.5 MG 24 hr capsule Take 1 capsule (37.5 mg total) by mouth daily with breakfast. 30 capsule 1   vitamin E 100 UNIT capsule Take by mouth daily.     No current facility-administered medications for this visit.    REVIEW OF SYSTEMS:   .10 Point review of Systems was done is negative except as noted above.  PHYSICAL EXAMINATION: ECOG PERFORMANCE STATUS: 0 - Asymptomatic  .BP 123/62 (BP Location: Left Arm, Patient Position: Sitting)   Pulse (!) 56 Comment: Nurse was notify  Temp 97.9 F (36.6 C) (Temporal)   Resp 14   Wt 135 lb 14.4 oz (61.6 kg)   SpO2 98%   BMI 24.07 kg/m  NAD GENERAL:alert, in no acute distress and comfortable SKIN: no acute rashes, no significant lesions EYES: conjunctiva are pink and non-injected, sclera anicteric NECK: supple, no JVD LYMPH:  no palpable lymphadenopathy in the cervical, axillary or inguinal regions LUNGS: clear to auscultation b/l with normal respiratory effort HEART: regular rate & rhythm ABDOMEN:  normoactive bowel sounds , non tender, not distended. Extremity: no pedal edema PSYCH: alert & oriented x 3 with fluent speech NEURO: no focal motor/sensory deficits  LABORATORY DATA:  I have reviewed the data as listed  .    Latest Ref Rng & Units 12/12/2021    8:23 AM 04/24/2021    8:18 AM 10/21/2020    7:46 AM  CBC  WBC 4.0 - 10.5 K/uL 4.8  5.3  4.9   Hemoglobin 12.0 - 15.0 g/dL 15.0  14.3  14.8   Hematocrit 36.0 - 46.0 % 43.7  43.4  44.6   Platelets 150 - 400 K/uL 103  124  135    . CBC    Component Value Date/Time   WBC 4.8 12/12/2021 0823   WBC 4.9 10/21/2020 0746   RBC 4.62 12/12/2021 0823   HGB 15.0 12/12/2021 0823   HGB 14.0 03/22/2017 0950   HCT 43.7 12/12/2021 0823   HCT 41.9 03/22/2017 0950   PLT 103 (L)  12/12/2021 0823   PLT 136 (L) 03/22/2017 0950   MCV 94.6 12/12/2021 0823   MCV 93.3 03/22/2017 0950   MCH 32.5 12/12/2021 0823   MCHC 34.3 12/12/2021 0823   RDW 13.0 12/12/2021 0823   RDW 15.0 (H) 03/22/2017 0950   LYMPHSABS 1.8 12/12/2021 0823   LYMPHSABS 1.5 03/22/2017 0950   MONOABS 0.3 12/12/2021 0823   MONOABS 0.3 03/22/2017 0950   EOSABS 0.1 12/12/2021 0823   EOSABS 0.1 03/22/2017 0950   BASOSABS 0.0 12/12/2021  4034   BASOSABS 0.0 03/22/2017 0950    .    Latest Ref Rng & Units 12/12/2021    8:23 AM 04/24/2021    8:18 AM 10/21/2020    7:46 AM  CMP  Glucose 70 - 99 mg/dL 96  88  65   BUN 8 - 23 mg/dL '15  13  12   ' Creatinine 0.44 - 1.00 mg/dL 0.81  0.85  0.86   Sodium 135 - 145 mmol/L 143  139  143   Potassium 3.5 - 5.1 mmol/L 3.7  4.1  4.1   Chloride 98 - 111 mmol/L 109  104  107   CO2 22 - 32 mmol/L '26  28  26   ' Calcium 8.9 - 10.3 mg/dL 9.7  9.3  9.4   Total Protein 6.5 - 8.1 g/dL 6.6  6.6  6.6   Total Bilirubin 0.3 - 1.2 mg/dL 0.6  0.6  0.6   Alkaline Phos 38 - 126 U/L 67  65  92   AST 15 - 41 U/L '18  17  24   ' ALT 0 - 44 U/L '14  18  30    ' . Lab Results  Component Value Date   LDH 172 04/24/2021        Hematopathology Order10/03/2017 [place]  BONE MARROW BIOPSY 01/25/2017: Interpretation Fourteen of the twenty metaphase cells analyzed are normal (46,XX). Six cells are abnormal and contain a deletion within the long arm of chromosome 7 and additional material of unknown origin attached to the long arm of chromosome 14 at or near the [facility] locus. Deletion of 7q is seen in mature B-cell neoplasms including marginal zone lymphoma. Ten cells were analyzed from the 24 hour unstimulated culture and ten cells were analyzed from the 72 hour culture stimulated with pokeweed mitogen, phorbol 12-myristate 13-acetate (PMA), and a CpG-oligodeoxynucleotide (ODN). The abnormal clone was only seen in the 72 hour stimulated culture.   Component Name Value Ref Range  Case  Report Surgical Pathology Report                         Case: [id number]                                Authorizing Provider:  Rulon Sera,  Collected:           01/25/2017 1100                                    AGNP                                                                        Ordering Location:     [facility]      Received:            01/25/2017 [place]  Pathologist:           Trudee Kuster,                                                                          MD                                                                          Specimens:   A) - Bone Marrow Right - Aspirate                                                                  B) - Bone Marrow Right - Biopsy                                                                    C) - Peripheral Blood                                                                    Final Diagnosis Bone marrow, right iliac, aspiration and biopsy -  Hypercellular bone marrow (70%) with involvement by mature B-cell lymphoma with non-specific immunophenotype (representing approximately 20% of marrow cellularity by immunohistochemical analysis) (See Comment)  -  Cytogenetic studies are pending.     RADIOGRAPHIC STUDIES: I have personally reviewed the radiological images as listed and agreed with the findings in the report. No results found.  ASSESSMENT & PLAN:   71 y.o. female with   1) history of splenic marginal zone lymphoma. Presented with splenomegaly and cytopenia with constitutional sypmtoms fatigue and night sweats - now resolved with Rituxan treatment. HIV, hepatitis C and hepatitis B serologies unrevealing  SPEP shows no M spike and negative IFE.  BM Bx done at [facility] -  Hypercellular bone marrow (70%) with involvement by mature B-cell lymphoma with non-specific immunophenotype  (representing approximately 20% of marrow cellularity by immunohistochemical analysis) Cytogenetics 7q deletion  2) Hot flashes grade 2  Patient declines consideration of medications for this.  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  Minimal and stable.  4) history of Rituxan related Rash - grade 1. Resolved and did not recur with dose 2 and 3 and 4 with adjusted premedications.  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 Patient notes that her sister had breast cancer in her 72s . Has not had genetic testing . No other family history of breast cancer ovarian cancer pancreatic cancer or melanoma . She has had recent left breast cysts that were aspirated in [place] and per her report were noted to be benign . -yrly MMG with PCP (last done 12/31/2017) - negative   7) history of depression was previously on Lexapro currently not on any medications . Lost her husband 2009 to throat cancer .  PLAN: -Discussed labwork today CBC is stable with mild chronic thrombocytopenia.  Normal hemoglobin and WBC count no lymphocytosis. CMP within normal limits LDH is slightly elevated at 204. -Patient has lab or clinical findings suggestive of splenic marginal zone lymphoma progression at this time. -She has some mild discomfort in her epigastric area and left upper quadrant which seems to increase with p.o. intake.  Also notes some early satiety.  Could be from GERD and gastritis. -Recommended she start taking omeprazole 20 mg p.o. daily over-the-counter for 2 to 4 weeks to see if the symptoms resolve. -She should call us or her primary care physician if the symptoms persist or worsen or if she decides to get the CT abdomen and GI referral. -We will followed by a note to her primary care physician to follow-up on her symptoms and consider GI referral. -She notes appointment with urogynecology 01/23/2022 for prolapsed  uterus. -We discussed starting Vitamin D 2000 units per day. -We discussed trying Vitamin E and evening primrose tea to help with the hot flashes in addition to maintaining proper hydration. -We will order CT chest, abd, pelvis for further evaluation.  FOLLOW UP: CT Abd/pelvis w/ contrast in 1 week Phone visit with Dr Irene Limbo in 2 weeks  .The total time spent in the appointment was 25 minutes* .  All of the patient's questions were answered with apparent satisfaction. The patient knows to call the clinic with any problems, questions or concerns.   Sullivan Lone MD MS AAHIVMS [facility] Hematology/Oncology Physician [facility]  .*Total Encounter Time as defined by the Centers for Medicare and Medicaid Services includes, in addition to the face-to-face time of a patient visit (documented in the note above) non-face-to-face time: obtaining and reviewing outside history, ordering and reviewing medications, tests or procedures, care coordination (communications with other health care professionals or caregivers) and documentation in the medical record.   I, Melene Muller, am acting as scribe for Dr. Sullivan Lone, MD.  .I have reviewed the above documentation for accuracy and completeness, and I agree with the above.Brunetta Genera MD

## 2021-12-14 ENCOUNTER — Other Ambulatory Visit: Payer: Self-pay | Admitting: Internal Medicine

## 2021-12-14 ENCOUNTER — Other Ambulatory Visit: Payer: Self-pay | Admitting: Family Medicine

## 2021-12-14 DIAGNOSIS — Z1231 Encounter for screening mammogram for malignant neoplasm of breast: Secondary | ICD-10-CM

## 2021-12-19 ENCOUNTER — Ambulatory Visit (HOSPITAL_COMMUNITY)
Admission: RE | Admit: 2021-12-19 | Discharge: 2021-12-19 | Disposition: A | Discharge status: Home / Self Care | Payer: Medicare HMO | Source: Ambulatory Visit | Attending: Hematology | Admitting: Hematology

## 2021-12-19 DIAGNOSIS — R1030 Lower abdominal pain, unspecified: Secondary | ICD-10-CM | POA: Diagnosis present

## 2021-12-19 DIAGNOSIS — C8307 Small cell B-cell lymphoma, spleen: Secondary | ICD-10-CM | POA: Insufficient documentation

## 2021-12-19 MED ORDER — SODIUM CHLORIDE (PF) 0.9 % IJ SOLN
INTRAMUSCULAR | Status: AC
  Filled 2021-12-19: qty 50

## 2021-12-19 MED ORDER — IOHEXOL 300 MG/ML  SOLN
100.0000 mL | Freq: Once | INTRAMUSCULAR | Status: AC | PRN
  Administered 2021-12-19: 100 mL via INTRAVENOUS

## 2021-12-29 ENCOUNTER — Inpatient Hospital Stay: Payer: Medicare HMO | Attending: Hematology | Admitting: Hematology

## 2021-12-29 DIAGNOSIS — Z803 Family history of malignant neoplasm of breast: Secondary | ICD-10-CM | POA: Diagnosis not present

## 2021-12-29 DIAGNOSIS — C8307 Small cell B-cell lymphoma, spleen: Secondary | ICD-10-CM

## 2021-12-29 DIAGNOSIS — C884 Extranodal marginal zone B-cell lymphoma of mucosa-associated lymphoid tissue [MALT-lymphoma]: Secondary | ICD-10-CM | POA: Insufficient documentation

## 2022-01-01 ENCOUNTER — Telehealth: Payer: Self-pay | Admitting: Hematology

## 2022-01-01 NOTE — Telephone Encounter (Signed)
Scheduled follow-up appointment per 9/15 los. Patient is aware.

## 2022-01-04 NOTE — Progress Notes (Signed)
[name]    HEMATOLOGY/ONCOLOGY PHONE VISIT NOTE  Date of Service: 01/04/22   Patient Care Team: London Pepper, MD as PCP - General (Family Medicine)  CHIEF COMPLAINTS/PURPOSE OF CONSULTATION:   Discussion of CT abdomen pelvis wrist is in the context of splenic marginal zone lymphoma  HISTORY OF PRESENTING ILLNESS:   Please see previous notes for details on initial presentation  INTERVAL HISTORY: .Marland KitchenI connected with Joanne Miller on 12/29/2021 at  8:40 AM EDT by telephone visit and verified that I am speaking with the correct person using two identifiers.   I discussed the limitations, risks, security and privacy concerns of performing an evaluation and management service by telemedicine and the availability of in-person appointments. I also discussed with the patient that there may be a patient responsible charge related to this service. The patient expressed understanding and agreed to proceed.   Other persons participating in the visit and their role in the encounter: none   Patient's location: Home  Provider's location: [facility]   Chief Complaint: Discussion of CT abdomen pelvis results in the context of left upper quadrant abdominal pain and history of splenic marginal zone lymphoma.  Patient notes the left upper abdominal discomfort is improved some and she reports that it does not bothersome enough to keep her from doing what she wants to do and she would like to monitor things at this time. CT scan results were discussed and show mild increased splenomegaly.  Her spleen has increased from 10 to 11 cm on prior examination in 2018 up to 12 to 13 cm on the CT scan. We discussed that this is not a very large change. Also her recent labs showed mild thrombocytopenia of 103k but no significant anemia or lymphocytosis. No significant new bone pains weight loss anorexia or significant fatigue. Has had some hot flashes likely from hormonal changes which are unchanged.   MEDICAL HISTORY:    #1 Dyslipidemia - not on any medications #2 GERD #3 hot flashes menopausal status was previously on Climara patch #4 history of sebaceous cysts. #5 history of recent left breast cysts - aspirated by ultrasound guidance and noted to be negative for cancer. #6 family history of breast cancer in her sister when she was in her 90s. No genetic testing done at that time.  SURGICAL HISTORY:  #1 breast cysts 2 aspiration on the left side . #2 root canal treatments #3 sebaceous cyst excision   SOCIAL HISTORY: Social History   Socioeconomic History   Marital status: Widowed    Spouse name: Not on file   Number of children: Not on file   Years of education: Not on file   Highest education level: Not on file  Occupational History   Not on file  Tobacco Use   Smoking status: Never   Smokeless tobacco: Never  Vaping Use   Vaping Use: Never used  Substance and Sexual Activity   Alcohol use: Yes    Alcohol/week: 1.0 standard drink of alcohol    Types: 1 Glasses of wine per week    Comment: occasional   Drug use: No   Sexual activity: Not on file  Other Topics Concern   Not on file  Social History Narrative   Not on file   Social Determinants of Health   Financial Resource Strain: Not on file  Food Insecurity: Not on file  Transportation Needs: Not on file  Physical Activity: Not on file  Stress: Not on file  Social Connections: Not on file  Intimate Partner Violence: Not on file    FAMILY HISTORY: Family History  Problem Relation Age of Onset   Breast cancer Sister        early 71's   Breast cancer Maternal Aunt        diagnosed in her 30's    ALLERGIES:  is allergic to amoxicillin-pot clavulanate, medroxyprogesterone acetate, and codeine.  MEDICATIONS:  Current Outpatient Medications  Medication Sig Dispense Refill   aspirin 81 MG EC tablet Take 81 mg by mouth daily. Swallow whole.     cholecalciferol (VITAMIN D3) 25 MCG (1000 UNIT) tablet Take 1,000 Units by  mouth daily.     docusate sodium (COLACE) 100 MG capsule Take 1 capsule (100 mg total) by mouth 2 (two) times daily. 30 capsule 0   ibuprofen (ADVIL,MOTRIN) 600 MG tablet Take 1 tablet (600 mg total) by mouth every 6 (six) hours as needed. 30 tablet 0   Misc Natural Products (TART CHERRY ADVANCED PO) Take by mouth.     Omega-3 Fatty Acids (FISH OIL) 1200 MG CAPS Take by mouth.     ondansetron (ZOFRAN) 4 MG tablet Take 1 tablet (4 mg total) by mouth every 8 (eight) hours as needed for nausea or vomiting. 20 tablet 0   venlafaxine XR (EFFEXOR-XR) 37.5 MG 24 hr capsule Take 1 capsule (37.5 mg total) by mouth daily with breakfast. 30 capsule 1   vitamin E 100 UNIT capsule Take by mouth daily.     No current facility-administered medications for this visit.    REVIEW OF SYSTEMS:   Improvement in left upper quadrant abdominal discomfort No other acute new symptoms since her last clinic visit  PHYSICAL EXAMINATION: Telemedicine visit LABORATORY DATA:  I have reviewed the data as listed  .    Latest Ref Rng & Units 12/12/2021    8:23 AM 04/24/2021    8:18 AM 10/21/2020    7:46 AM  CBC  WBC 4.0 - 10.5 K/uL 4.8  5.3  4.9   Hemoglobin 12.0 - 15.0 g/dL 15.0  14.3  14.8   Hematocrit 36.0 - 46.0 % 43.7  43.4  44.6   Platelets 150 - 400 K/uL 103  124  135    . CBC    Component Value Date/Time   WBC 4.8 12/12/2021 0823   WBC 4.9 10/21/2020 0746   RBC 4.62 12/12/2021 0823   HGB 15.0 12/12/2021 0823   HGB 14.0 03/22/2017 0950   HCT 43.7 12/12/2021 0823   HCT 41.9 03/22/2017 0950   PLT 103 (L) 12/12/2021 0823   PLT 136 (L) 03/22/2017 0950   MCV 94.6 12/12/2021 0823   MCV 93.3 03/22/2017 0950   MCH 32.5 12/12/2021 0823   MCHC 34.3 12/12/2021 0823   RDW 13.0 12/12/2021 0823   RDW 15.0 (H) 03/22/2017 0950   LYMPHSABS 1.8 12/12/2021 0823   LYMPHSABS 1.5 03/22/2017 0950   MONOABS 0.3 12/12/2021 0823   MONOABS 0.3 03/22/2017 0950   EOSABS 0.1 12/12/2021 0823   EOSABS 0.1 03/22/2017 0950    BASOSABS 0.0 12/12/2021 0823   BASOSABS 0.0 03/22/2017 0950    .    Latest Ref Rng & Units 12/12/2021    8:23 AM 04/24/2021    8:18 AM 10/21/2020    7:46 AM  CMP  Glucose 70 - 99 mg/dL 96  88  65   BUN 8 - 23 mg/dL _0 Creatinine 0.44 - 1.00 mg/dL 0.81  0.85  0.86  Sodium 135 - 145 mmol/L 143  139  143   Potassium 3.5 - 5.1 mmol/L 3.7  4.1  4.1   Chloride 98 - 111 mmol/L 109  104  107   CO2 22 - 32 mmol/L _0 Calcium 8.9 - 10.3 mg/dL 9.7  9.3  9.4   Total Protein 6.5 - 8.1 g/dL 6.6  6.6  6.6   Total Bilirubin 0.3 - 1.2 mg/dL 0.6  0.6  0.6   Alkaline Phos 38 - 126 U/L 67  65  92   AST 15 - 41 U/L _1 ALT 0 - 44 U/L _2 . Lab Results  Component Value Date   LDH 204 (H) 12/12/2021        Hematopathology Order10/03/2017 [place]  BONE MARROW BIOPSY 01/25/2017: Interpretation Fourteen of the twenty metaphase cells analyzed are normal (46,XX). Six cells are abnormal and contain a deletion within the long arm of chromosome 7 and additional material of unknown origin attached to the long arm of chromosome 14 at or near the [facility] locus. Deletion of 7q is seen in mature B-cell neoplasms including marginal zone lymphoma. Ten cells were analyzed from the 24 hour unstimulated culture and ten cells were analyzed from the 72 hour culture stimulated with pokeweed mitogen, phorbol 12-myristate 13-acetate (PMA), and a CpG-oligodeoxynucleotide (ODN). The abnormal clone was only seen in the 72 hour stimulated culture.   Component Name Value Ref Range  Case Report Surgical Pathology Report                         Case: [id number]                                Authorizing Provider:  Rulon Sera,  Collected:           01/25/2017 1100                                    AGNP                                                                        Ordering Location:     [facility]      Received:            01/25/2017 [place]                                                                 Pathologist:           Trudee Kuster,  MD                                                                          Specimens:   A) - Bone Marrow Right - Aspirate                                                                  B) - Bone Marrow Right - Biopsy                                                                    C) - Peripheral Blood                                                                    Final Diagnosis Bone marrow, right iliac, aspiration and biopsy -  Hypercellular bone marrow (70%) with involvement by mature B-cell lymphoma with non-specific immunophenotype (representing approximately 20% of marrow cellularity by immunohistochemical analysis) (See Comment)  -  Cytogenetic studies are pending.     RADIOGRAPHIC STUDIES: I have personally reviewed the radiological images as listed and agreed with the findings in the report. CT Abdomen Pelvis W Contrast  Result Date: 12/20/2021 CLINICAL DATA:  Worsening left-sided abdominal pain. Worsening thrombocytopenia. Splenic marginal zone lymphoma. * Tracking Code: BO * EXAM: CT ABDOMEN AND PELVIS WITH CONTRAST TECHNIQUE: Multidetector CT imaging of the abdomen and pelvis was performed using the standard protocol following bolus administration of intravenous contrast. RADIATION DOSE REDUCTION: This exam was performed according to the departmental dose-optimization program which includes automated exposure control, adjustment of the mA and/or kV according to patient size and/or use of iterative reconstruction technique. CONTRAST:  125m OMNIPAQUE IOHEXOL 300 MG/ML  SOLN COMPARISON:  03/01/2017 FINDINGS: Lower Chest: No acute findings. Hepatobiliary: Multiple tiny hepatic cysts remains stable. No hepatic masses identified. Gallbladder is unremarkable. No evidence  of biliary ductal dilatation. Pancreas:  No mass or inflammatory changes. Spleen: Mild splenomegaly has increased to approximately 12-13 cm in length compared to 10-11 cm on prior exam. No evidence of splenic infarct or mass. Adrenals/Urinary Tract: No suspicious masses identified. No evidence of ureteral calculi or hydronephrosis. Stomach/Bowel: No evidence of obstruction, inflammatory process or abnormal fluid collections. Normal appendix visualized. Vascular/Lymphatic: No pathologically enlarged lymph nodes. No acute vascular findings. Reproductive:  No mass or other significant abnormality. Other:  None. Musculoskeletal:  No suspicious bone lesions identified. IMPRESSION: Increased mild splenomegaly. No evidence of lymphadenopathy or other significant abnormality. Electronically Signed   By: JMarlaine HindM.D.   On: 12/20/2021 12:42  ASSESSMENT & PLAN:   71 y.o. female with   1) history of splenic marginal zone lymphoma. Presented with splenomegaly and cytopenia with constitutional sypmtoms fatigue and night sweats - now resolved with Rituxan treatment. HIV, hepatitis C and hepatitis B serologies unrevealing  SPEP shows no M spike and negative IFE.  BM Bx done at [facility] -  Hypercellular bone marrow (70%) with involvement by mature B-cell lymphoma with non-specific immunophenotype (representing approximately 20% of marrow cellularity by immunohistochemical analysis) Cytogenetics 7q deletion  2) Hot flashes grade 2  Patient declines consideration of medications for this.  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  Minimal and stable.  4) history of Rituxan related Rash - grade 1. Resolved and did not recur with dose 2 and 3 and 4 with adjusted premedications.  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 Patient notes that her sister had breast cancer in her 42s . Has not had genetic testing . No other  family history of breast cancer ovarian cancer pancreatic cancer or melanoma . She has had recent left breast cysts that were aspirated in [place] and per her report were noted to be benign . -yrly MMG with PCP (last done 12/31/2017) - negative   7) history of depression was previously on Lexapro currently not on any medications . Lost her husband 2009 to throat cancer .  PLAN: -Patient called to discuss CT abdomen pelvis.  This does show some gradual increase in her splenomegaly which is up to 12 to 13cms compared to the previous size of 10 to 11 cm in 2018. Patient is having minimal left upper quadrant discomfort which is improved at this time. Currently she has no significant cytopenias other than some mild thrombocytopenia. No significant related constitutional symptoms at this time. No new fatigue or weight loss. She would like to continue closely monitoring the situation would not prefer to retreated with Rituxan at this time.  FOLLOW UP: Return to clinic with Dr. Irene Limbo with labs in 4 months  The total time spent in the appointment was 10 minutes*.  All of the patient's questions were answered with apparent satisfaction. The patient knows to call the clinic with any problems, questions or concerns.   Sullivan Lone MD MS AAHIVMS [facility] Hematology/Oncology Physician [facility]  .*Total Encounter Time as defined by the Centers for Medicare and Medicaid Services includes, in addition to the face-to-face time of a patient visit (documented in the note above) non-face-to-face time: obtaining and reviewing outside history, ordering and reviewing medications, tests or procedures, care coordination (communications with other health care professionals or caregivers) and documentation in the medical record.

## 2022-01-18 ENCOUNTER — Ambulatory Visit: Payer: Medicare HMO

## 2022-01-18 ENCOUNTER — Ambulatory Visit
Admission: RE | Admit: 2022-01-18 | Discharge: 2022-01-18 | Disposition: A | Discharge status: Home / Self Care | Payer: Medicare HMO | Source: Ambulatory Visit | Attending: Family Medicine | Admitting: Family Medicine

## 2022-01-18 DIAGNOSIS — Z1231 Encounter for screening mammogram for malignant neoplasm of breast: Secondary | ICD-10-CM

## 2022-01-18 HISTORY — DX: Encounter for nonprocreative screening for genetic disease carrier status: Z13.71

## 2022-03-02 ENCOUNTER — Encounter (HOSPITAL_BASED_OUTPATIENT_CLINIC_OR_DEPARTMENT_OTHER): Payer: Self-pay

## 2022-03-02 ENCOUNTER — Emergency Department (HOSPITAL_BASED_OUTPATIENT_CLINIC_OR_DEPARTMENT_OTHER)
Admission: EM | Admit: 2022-03-02 | Discharge: 2022-03-02 | Disposition: A | Discharge status: Home / Self Care | Payer: Medicare HMO | Attending: Emergency Medicine | Admitting: Emergency Medicine

## 2022-03-02 ENCOUNTER — Other Ambulatory Visit: Payer: Self-pay

## 2022-03-02 ENCOUNTER — Emergency Department (HOSPITAL_BASED_OUTPATIENT_CLINIC_OR_DEPARTMENT_OTHER): Payer: Medicare HMO

## 2022-03-02 DIAGNOSIS — Z79899 Other long term (current) drug therapy: Secondary | ICD-10-CM | POA: Diagnosis not present

## 2022-03-02 DIAGNOSIS — R112 Nausea with vomiting, unspecified: Secondary | ICD-10-CM

## 2022-03-02 DIAGNOSIS — Z7982 Long term (current) use of aspirin: Secondary | ICD-10-CM | POA: Diagnosis not present

## 2022-03-02 DIAGNOSIS — D72829 Elevated white blood cell count, unspecified: Secondary | ICD-10-CM | POA: Diagnosis not present

## 2022-03-02 DIAGNOSIS — I700 Atherosclerosis of aorta: Secondary | ICD-10-CM | POA: Insufficient documentation

## 2022-03-02 LAB — COMPREHENSIVE METABOLIC PANEL
ALT: 29 U/L (ref 0–44)
AST: 21 U/L (ref 15–41)
Albumin: 4.3 g/dL (ref 3.5–5.0)
Alkaline Phosphatase: 69 U/L (ref 38–126)
Anion gap: 10 (ref 5–15)
BUN: 11 mg/dL (ref 8–23)
CO2: 25 mmol/L (ref 22–32)
Calcium: 9 mg/dL (ref 8.9–10.3)
Chloride: 101 mmol/L (ref 98–111)
Creatinine, Ser: 0.74 mg/dL (ref 0.44–1.00)
GFR, Estimated: >60 mL/min (ref >60)
Glucose, Bld: 115 mg/dL — ABNORMAL HIGH (ref 70–99)
Potassium: 3.9 mmol/L (ref 3.5–5.1)
Sodium: 136 mmol/L (ref 135–145)
Total Bilirubin: 0.9 mg/dL (ref 0.3–1.2)
Total Protein: 6.2 g/dL — ABNORMAL LOW (ref 6.5–8.1)

## 2022-03-02 LAB — URINALYSIS, ROUTINE W REFLEX MICROSCOPIC
Bilirubin Urine: NEGATIVE
Glucose, UA: NEGATIVE mg/dL
Ketones, ur: 15 mg/dL — AB
Nitrite: NEGATIVE
Protein, ur: NEGATIVE mg/dL
Specific Gravity, Urine: 1.011 (ref 1.005–1.030)
pH: 7 (ref 5.0–8.0)

## 2022-03-02 LAB — CBC
HCT: 38.4 % (ref 36.0–46.0)
Hemoglobin: 12.7 g/dL (ref 12.0–15.0)
MCH: 31.1 pg (ref 26.0–34.0)
MCHC: 33.1 g/dL (ref 30.0–36.0)
MCV: 94.1 fL (ref 80.0–100.0)
Platelets: 141 10*3/uL — ABNORMAL LOW (ref 150–400)
RBC: 4.08 MIL/uL (ref 3.87–5.11)
RDW: 13.5 % (ref 11.5–15.5)
WBC: 6.7 10*3/uL (ref 4.0–10.5)
nRBC: 0 % (ref 0.0–0.2)

## 2022-03-02 LAB — LIPASE, BLOOD: Lipase: 28 U/L (ref 11–51)

## 2022-03-02 MED ORDER — PROMETHAZINE HCL 25 MG/ML IJ SOLN
INTRAMUSCULAR | Status: AC
  Administered 2022-03-02: 12.5 mg
  Filled 2022-03-02: qty 1

## 2022-03-02 MED ORDER — PROMETHAZINE HCL 25 MG PO TABS: 25.0000 mg | ORAL_TABLET | Freq: Four times a day (QID) | ORAL | 0 refills | Status: DC | PRN

## 2022-03-02 MED ORDER — MORPHINE SULFATE (PF) 4 MG/ML IV SOLN: 4.0000 mg | Freq: Once | INTRAVENOUS | Status: DC

## 2022-03-02 MED ORDER — DIPHENHYDRAMINE HCL 50 MG/ML IJ SOLN
12.5000 mg | Freq: Once | INTRAMUSCULAR | Status: DC
  Filled 2022-03-02: qty 1

## 2022-03-02 MED ORDER — LACTATED RINGERS IV BOLUS
1000.0000 mL | Freq: Once | INTRAVENOUS | Status: AC
  Administered 2022-03-02: 1000 mL via INTRAVENOUS

## 2022-03-02 MED ORDER — SODIUM CHLORIDE 0.9 % IV SOLN
12.5000 mg | Freq: Four times a day (QID) | INTRAVENOUS | Status: DC | PRN
  Administered 2022-03-02: 12.5 mg via INTRAVENOUS
  Filled 2022-03-02: qty 0.5

## 2022-03-02 MED ORDER — IOHEXOL 300 MG/ML  SOLN
100.0000 mL | Freq: Once | INTRAMUSCULAR | Status: AC | PRN
  Administered 2022-03-02: 80 mL via INTRAVENOUS

## 2022-03-02 MED ORDER — ONDANSETRON HCL 4 MG/2ML IJ SOLN
4.0000 mg | Freq: Once | INTRAMUSCULAR | Status: AC
  Administered 2022-03-02: 4 mg via INTRAVENOUS
  Filled 2022-03-02: qty 2

## 2022-03-02 MED ORDER — KETOROLAC TROMETHAMINE 15 MG/ML IJ SOLN
15.0000 mg | Freq: Once | INTRAMUSCULAR | Status: AC
  Administered 2022-03-02: 15 mg via INTRAVENOUS
  Filled 2022-03-02: qty 1

## 2022-03-02 NOTE — ED Notes (Signed)
Pt reports nausea has resolved -- now drinking ginger ale after being cleared for PO challenge by Dr. Nechama Guard

## 2022-03-02 NOTE — ED Notes (Addendum)
ED Provider at bedside. Provider made aware pt is refusing Morphine- pt would like something else for pain; reports ongoing anal pain described as pressure rated 7/10 with nausea -- pt states nausea has improved since anti-emetic but not completely resolved.

## 2022-03-02 NOTE — ED Provider Notes (Signed)
[facility] Provider Note   CSN: [id number] Arrival date & time: 03/02/22  1704     History  Chief Complaint  Patient presents with   Nausea    Jo[facility] is a 71 y.o. female.  With PMH of splenic lymphoma in remission, uterovaginal prolapse s/p LSC supracervical hysterectomy and sacral colpoplexy and mid urethral sling on 11/16 with Dr Landry Mellow who presents with worsening nausea, nonbloody nonbilious emesis and inability to keep foods down and generalized weakness.  Initially after the surgery yesterday she felt generally well.  She went home and started antibiotics with Flagyl and was taking Zofran as needed for nausea or vomiting and the pain medications as prescribed with tramadol and ibuprofen.  However waking up today she has felt horrible reporting lower abdominal pain and generalized fatigue and weakness.  She has been unable to keep any food or liquid down.  Her last bowel movement was this past Thursday but she is still passing gas.  She has had no fevers or discharge from surgical sites.  No vaginal discharge or anal discharge.  She is still peeing and denies any burning with peeing.  She has no chest pain, no shortness of breath, no focal weakness numbness or tingling.  She called her surgeon who advised her to go to a local ED to be evaluated.  HPI     Home Medications Prior to Admission medications   Medication Sig Start Date End Date Taking? Authorizing Provider  promethazine (PHENERGAN) 25 MG tablet Take 1 tablet (25 mg total) by mouth every 6 (six) hours as needed for nausea or vomiting. 03/02/22  Yes Elgie Congo, MD  aspirin 81 MG EC tablet Take 81 mg by mouth daily. Swallow whole.    [provider]  cholecalciferol (VITAMIN D3) 25 MCG (1000 UNIT) tablet Take 1,000 Units by mouth daily.    [provider]  docusate sodium (COLACE) 100 MG capsule Take 1 capsule (100 mg total) by mouth 2 (two) times daily.  10/30/17   Janyth Pupa, DO  ibuprofen (ADVIL,MOTRIN) 600 MG tablet Take 1 tablet (600 mg total) by mouth every 6 (six) hours as needed. 10/30/17   Janyth Pupa, DO  Misc Natural Products (TART CHERRY ADVANCED PO) Take by mouth.    [provider]  Omega-3 Fatty Acids (FISH OIL) 1200 MG CAPS Take by mouth.    [provider]  ondansetron (ZOFRAN) 4 MG tablet Take 1 tablet (4 mg total) by mouth every 8 (eight) hours as needed for nausea or vomiting. 10/30/17   Janyth Pupa, DO  venlafaxine XR (EFFEXOR-XR) 37.5 MG 24 hr capsule Take 1 capsule (37.5 mg total) by mouth daily with breakfast. 09/22/19   Brunetta Genera, MD  vitamin E 100 UNIT capsule Take by mouth daily.    [provider]      Allergies    Amoxicillin-pot clavulanate, Medroxyprogesterone acetate, and Codeine    Review of Systems   Review of Systems  Physical Exam Updated Vital Signs BP 120/67   Pulse 63   Temp 98.8 F (37.1 C) (Oral)   Resp 20   Ht '5\' 3"'$  (1.6 m)   Wt 61.6 kg   SpO2 94%   BMI 24.06 kg/m  Physical Exam Constitutional: Alert and oriented.  Slightly fatigued in appearance but no acute distress and nontoxic Eyes: Conjunctivae are normal. ENT      Head: Normocephalic and atraumatic.      Nose: No congestion.  Mouth/Throat: Mucous membranes are moist.      Neck: No stridor. Cardiovascular: S1, S2,  Normal and symmetric distal pulses are present in all extremities.Warm and well perfused. Respiratory: Normal respiratory effort. Breath sounds are normal. Gastrointestinal: Soft and nontender.  Scattered ecchymoses of the lower abdomen and rectal region.  There is no erythema, warmth or purulent discharge, perianal bruising present but no active discharge no fluctuance. Musculoskeletal: Normal range of motion in all extremities. Neurologic: Normal speech and language.  No facial droop.  Moving all extremities equally.  Steady ambulatory gait.  Sensation grossly intact.  No  gross focal neurologic deficits are appreciated. Skin: Skin is warm, dry and intact. No rash noted. Psychiatric: Mood and affect are normal. Speech and behavior are normal.  ED Results / Procedures / Treatments   Labs (all labs ordered are listed, but only abnormal results are displayed) Labs Reviewed  COMPREHENSIVE METABOLIC PANEL - Abnormal; Notable for the following components:      Result Value   Glucose, Bld 115 (*)    Total Protein 6.2 (*)    All other components within normal limits  CBC - Abnormal; Notable for the following components:   Platelets 141 (*)    All other components within normal limits  URINALYSIS, ROUTINE W REFLEX MICROSCOPIC - Abnormal; Notable for the following components:   Hgb urine dipstick SMALL (*)    Ketones, ur 15 (*)    Leukocytes,Ua SMALL (*)    All other components within normal limits  LIPASE, BLOOD    EKG EKG Interpretation  Date/Time:  Friday March 02 2022 18:56:30 EST Ventricular Rate:  63 PR Interval:  135 QRS Duration: 91 QT Interval:  421 QTC Calculation: 431 R Axis:   79 Text Interpretation: Sinus rhythm Confirmed by Georgina Snell [phone]) on 03/02/2022 7:34:27 PM  Radiology CT ABDOMEN PELVIS W CONTRAST  Result Date: 03/02/2022 CLINICAL DATA:  Postop pain EXAM: CT ABDOMEN AND PELVIS WITH CONTRAST TECHNIQUE: Multidetector CT imaging of the abdomen and pelvis was performed using the standard protocol following bolus administration of intravenous contrast. RADIATION DOSE REDUCTION: This exam was performed according to the departmental dose-optimization program which includes automated exposure control, adjustment of the mA and/or kV according to patient size and/or use of iterative reconstruction technique. CONTRAST:  60m OMNIPAQUE IOHEXOL 300 MG/ML  SOLN COMPARISON:  CT abdomen and pelvis dated September 5th 2023 FINDINGS: Lower chest: Bibasilar atelectasis Hepatobiliary: Scattered low-attenuation liver lesions which are  unchanged when compared with prior exam and likely simple cysts. No suspicious liver lesions. Gallbladder is unremarkable. No biliary ductal dilation. Pancreas: Unremarkable. No pancreatic ductal dilatation or surrounding inflammatory changes. Spleen: Unchanged splenomegaly, measuring up to 13.0 cm. Adrenals/Urinary Tract: Bilateral adrenal glands are unremarkable. No hydronephrosis. Prominent bilateral extrarenal pelvises. No nephrolithiasis. Small locules of air are seen in the urinary bladder. No bladder wall thickening. Stomach/Bowel: Stomach is within normal limits. Appendix appears normal. No evidence of bowel wall thickening, distention, or inflammatory changes. Vascular/Lymphatic: Mild aortic atherosclerosis. No enlarged abdominal or pelvic lymph nodes. Reproductive: Postsurgical changes of interval partial hysterectomy. Other: Small volume intraperitoneal free air and mild pelvic fat stranding locules of gas are seen in the anterior abdominal wall. Small perineal fluid collection seen anterior to the rectum measuring 1.7 x 1.5 cm on series 2, image 79. Musculoskeletal: No acute or significant osseous findings. IMPRESSION: 1. Postsurgical changes of interval partial hysterectomy and sacrocolpopexy with small volume intraperitoneal free air and mild pelvic fat stranding. 2. Small perineal fluid collection  is seen anterior to the rectum, possibly postsurgical. Correlate clinically for signs of infection. 3. Small locules of air are seen in the urinary bladder, likely due to recent instrumentation, although infection could also have this appearance. Correlate with urinalysis. 4. 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 5. Aortic Atherosclerosis (ICD10-I70.0). Electronically Signed   By: Yetta Glassman M.D.   On: 03/02/2022 20:07    Procedures Procedures  Remain on constant cardiac monitoring which I personally reviewed normal sinus rhythm.  Medications Ordered in  ED Medications  promethazine (PHENERGAN) 12.5 mg in sodium chloride 0.9 % 50 mL IVPB (0 mg Intravenous Stopped 03/02/22 1921)  morphine (PF) 4 MG/ML injection 4 mg (4 mg Intravenous Patient Refused/Not Given 03/02/22 1919)  diphenhydrAMINE (BENADRYL) injection 12.5 mg (12.5 mg Intravenous Patient Refused/Not Given 03/02/22 2013)  promethazine (PHENERGAN) 25 MG/ML injection (12.5 mg  Given 03/02/22 1814)  lactated ringers bolus 1,000 mL (0 mLs Intravenous Stopped 03/02/22 2116)  iohexol (OMNIPAQUE) 300 MG/ML solution 100 mL (80 mLs Intravenous Contrast Given 03/02/22 1927)  ondansetron (ZOFRAN) injection 4 mg (4 mg Intravenous Given 03/02/22 2013)  ketorolac (TORADOL) 15 MG/ML injection 15 mg (15 mg Intravenous Given 03/02/22 2013)  lactated ringers bolus 1,000 mL (0 mLs Intravenous Stopped 03/02/22 2230)    ED Course/ Medical Decision Making/ A&P Clinical Course as of 03/02/22 2320  Fri Mar 02, 2022  2034 Patient ambulating to the bathroom now.  She does endorse significant improvement in symptoms.  CTAP with results as listed below but mainly all postsurgical changes.  Evaluated her rectum which showed no purulent discharge, no fluctuance, no findings concerning for external infection especially with normal white blood cell count.  IMPRESSION: 1. Postsurgical changes of interval partial hysterectomy and sacrocolpopexy with small volume intraperitoneal free air and mild pelvic fat stranding. 2. Small perineal fluid collection is seen anterior to the rectum, possibly postsurgical. Correlate clinically for signs of infection. 3. Small locules of air are seen in the urinary bladder, likely due to recent instrumentation, although infection could also have this appearance. Correlate with urinalysis. 4. 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 5. Aortic Atherosclerosis (ICD10-I70.0).   [VB]  2053 Chalmers Guest): SMALL [VB]  2055 Spoke with Dr Donald Pore  gynecology of [facility] who referred me to Dr. Eulogio Bear on-call fellow for urogynecology.  She recommends continued symptom control and p.o. challenge and discharge with more antiemetics.  She recommends admission to [facility] since this seems more like a medical issue and there are no postop complications if she is still unable to tolerate p.o. [VB]  2236 Patient reassessed, she is feeling markedly improved.  She ambulated herself to the bathroom without assistance.  She is tolerating p.o. without nausea or vomiting.  She has plans to follow-up with her surgeon on Monday.  Discharged with prescription for Phenergan and advised holding off from using tramadol unless severe pain.  She is in agreement with plan and safe for discharge. [VB]    Clinical Course User Index [VB] Elgie Congo, MD                           Medical Decision Making ALEETA SCHMALTZ is a 71 y.o. female.  With PMH of splenic lymphoma in remission, uterovaginal prolapse s/p LSC supracervical hysterectomy and sacral colpoplexy and mid urethral sling on 11/16 with Dr Landry Mellow who presents with worsening nausea, nonbloody nonbilious emesis and inability to keep  foods down and generalized weakness.  Regarding patient's symptoms, concern for possible UTI, medication side effects, constipation, postop complication, dehydration or AKI among multiple other etiologies of symptoms.  She has no focal deficits, no concern for CVA.  Not concern for cardiopulmonary pathology with no cardiopulmonary complaints and EKG obtained normal sinus rhythm with no ST/T changes suggestive of ischemia.  Labs obtained and reviewed which were generally reassuring with normal white blood cell count 6.7 and normal hemoglobin 12.7, no anemia.  Creatinine was 0.74 within normal limits.  Glucose 115 potassium 3.9.  Lipase within normal limits no concern for pancreatitis.  UA obtained with ketones suggestive of dehydration with hemoglobin and small  leukocyte esterase but with no UTI symptoms and no nitrite and only 0-5 WBCs, holding off from treating as UTI and allowing for reflex culture to result.  CT abdomen pelvis obtained with IV contrast to further evaluate for any postop complication which I personally reviewed and showed generally suspected postop changes "1. Postsurgical changes of interval partial hysterectomy and  sacrocolpopexy with small volume intraperitoneal free air and mild  pelvic fat stranding.  2. Small perineal fluid collection is seen anterior to the rectum,  possibly postsurgical. Correlate clinically for signs of infection.  3. Small locules of air are seen in the urinary bladder, likely due  to recent instrumentation, although infection could also have this  appearance. Correlate with urinalysis.  4. Unchanged splenomegaly, compatible with history of marginal zone  lymphoma.  5. Aortic Atherosclerosis (ICD10-I70.0). "  I discussed case Spoke with Dr Donald Pore gynecology of [facility] who referred me to Dr. Eulogio Bear on-call fellow for urogynecology.  She recommends continued symptom control and p.o. challenge and discharge with more antiemetics.  She recommends admission to [facility] since this seems more like a medical issue and there are no postop complications if she is still unable to tolerate p.o.  Patient was given IV fluids, IV Phenergan, Iv toradol and IV Zofran with significant improvement in symptoms.  She feels safe with discharge and follow-up with her surgeon on Monday.  I have no concern for infection based off work-up and CT scan and physical exam today especially with normal white blood cell count.  Discharged with Phenergan and advised holding off from tramadol unless severe pain and strict return precautions.  She is in agreement with plan and safe for discharge.  Amount and/or Complexity of Data Reviewed Labs: ordered. Decision-making details documented in ED Course. Radiology:  ordered.  Risk Prescription drug management.   Final Clinical Impression(s) / ED Diagnoses Final diagnoses:  Nausea and vomiting, unspecified vomiting type    Rx / DC Orders ED Discharge Orders          Ordered    promethazine (PHENERGAN) 25 MG tablet  Every 6 hours PRN        03/02/22 2237              Elgie Congo, MD 03/02/22 2320

## 2022-03-02 NOTE — ED Notes (Signed)
Fluid complete... Pump cut off and fluids disconnected.

## 2022-03-02 NOTE — ED Triage Notes (Signed)
Had surgery yesterday for vaginal prolapse.  Today present for uncontrolled nausea.  Lots of rectal pain.  States medications not helping.  Present in W/C  Very weak with transport to bed.

## 2022-03-02 NOTE — Discharge Instructions (Signed)
You have been seen in the Emergency Department (ED)  today for nausea and vomiting after your recent surgery.  Your work up today has not shown a clear cause for your symptoms, but were overall reassuring including your blood work, urine and CT scan.  I suspect this could be related to the meds you are currently taking.  Hold off from taking tramadol unless severe uncontrolled pain. You have been prescribed Phenergan; please use as prescribed as needed for your nausea.  Follow up with your surgeon as soon as possible, ideally within one week, regarding today's emergent visit and your symptoms of nausea/vomiting.   Return to the Emergency Department (ED)  if you develop severe abdominal pain, bloody vomiting, bloody diarrhea, if you are unable to tolerate fluids due to vomiting, or if you develop other symptoms that concern you.

## 2022-03-02 NOTE — ED Notes (Signed)
[place] for OBGYN Consult w/ Dr. Landry Mellow (or whoever is on call for her if she is not available) per EDP at 838pm

## 2022-03-20 ENCOUNTER — Encounter (INDEPENDENT_AMBULATORY_CARE_PROVIDER_SITE_OTHER): Payer: Self-pay

## 2022-03-20 ENCOUNTER — Ambulatory Visit (INDEPENDENT_AMBULATORY_CARE_PROVIDER_SITE_OTHER): Payer: Medicare (Managed Care) | Admitting: Family Nurse Practitioner

## 2022-03-20 VITALS — BP 114/67 | HR 71 | Temp 97.9°F | Resp 18 | Ht 62.0 in | Wt 135.0 lb

## 2022-03-20 DIAGNOSIS — R300 Dysuria: Secondary | ICD-10-CM

## 2022-03-20 LAB — MCKESSON POCT UA 120
Bilirubin UA: NEGATIVE
Blood UA: NEGATIVE
Glucose UA: NEGATIVE
Ketone UA: NEGATIVE
Leukocytes UA: NEGATIVE
Nitrite UA: NEGATIVE
Protein UA: NEGATIVE
Specific Gravity UA: 1.01
Urobilinogen UA: NEGATIVE
pH UA: 6.5

## 2022-03-20 NOTE — Patient Instructions (Signed)
Start taking antibiotic as directed.  Your urine culture is pending.  We will call you if the antibiotic you are on needs to be changed based on the results of the culture.  Stay well hydrated.  Follow-up the primary care provider  Return for worsening symptoms

## 2022-03-20 NOTE — Progress Notes (Signed)
[facility]  URGENT  CARE  PROGRESS NOTE     Patient: Margaret Malone   Date: 03/20/2022   MRN: [id number]       Margaret Malone is a 71 y.o. female      HISTORY     History obtained from: Patient    Chief Complaint   Patient presents with    Urinary Tract Infection Symptoms     X few weeks. Just finished antibiotics for UTI, nitrofurantoin 100 mg. Dysuria,right back pain.         Intermittent dysuria and right-sided back pain has been ongoing over the last week or so.  Patient was recently treated with Macrobid for urinary tract infection.  She denies new abdominal pain or fevers at home.  She denies bleeding or vaginal discharge.  Tells me the pain she has with urination is only when she feels like she really "has to go."      Urinary Tract Infection Symptoms          Review of Systems   Genitourinary:  Positive for dysuria.   All other systems reviewed and are negative.      History:    Pertinent Past Medical, Surgical, Family and Social History were reviewed.        Current Outpatient Medications:     phenazopyridine (PYRIDIUM) 100 MG tablet, Take 1 tablet (100 mg) by mouth 3 (three) times daily as needed for Pain (Patient not taking: Reported on 03/20/2022), Disp: 6 tablet, Rfl: 0    Allergies   Allergen Reactions    Amoxicillin-Pot Clavulanate Other (See Comments)     Muscle tone loss, and "passed out, and after 2 hours I was fine"    Codeine Nausea And Vomiting    Medroxyprogesterone Other (See Comments)     "Passed out"       Medications and Allergies reviewed.    PHYSICAL EXAM     Vitals:    03/20/22 1213   BP: 114/67   BP Site: Right arm   Patient Position: Standing   Cuff Size: Medium   Pulse: 71   Resp: 18   Temp: 97.9 F (36.6 C)   TempSrc: Tympanic   SpO2: 97%   Weight: 61.2 kg (135 lb)   Height: 1.575 m (5\' 2" )       Physical Exam  Constitutional:       General: She is not in acute distress.     Appearance: She is well-developed.   HENT:      Head: Normocephalic and atraumatic.     Eyes: Conjunctivae are normal.  Cardiovascular:      Rate and Rhythm: Normal rate and regular rhythm.   Pulmonary:      Effort: Pulmonary effort is normal.      Breath sounds: Normal breath sounds.   Abdominal:      Tenderness: There is no abdominal tenderness. There is no right CVA tenderness or left CVA tenderness.   Neurological:      Mental Status: She is alert and oriented to person, place, and time.   Skin:     General: Skin is warm.   Vitals and nursing note reviewed.         UCC COURSE     LABS  The following POCT tests were ordered, reviewed and discussed with the patient/family.     Results       Procedure Component Value Units Date/Time    UA [[id number]]  (Normal) Collected: 03/20/22 1226  Specimen: Clean Catch Updated: 03/20/22 1227     Color, UA Yellow     Clarity, UA Clear     Leukocytes UA Negative     Nitrite UA Negative     Urobilinogen UA Negative (0.2 mg/dl)     Protein UA Negative     pH UA 6.5     Blood UA Negative     Specific Gravity UA 1.010     Ketone UA Negative     Bilirubin UA Negative     Glucose UA Negative            There were no x-rays reviewed with this patient during the visit.    No current facility-administered medications for this visit.       PROCEDURES     Procedures    MEDICAL DECISION MAKING     History, physical, labs/studies most consistent with dysuria as the diagnosis.    Chart Review:  Prior PCP, Specialist and/or ED notes reviewed today: Yes  Prior labs/images/studies reviewed today: Yes    Differential Diagnosis: Acute pyelonephritis, kidney stones, bladder cancer, irritated urethra, vaginitis, interstitial cystitis, PID, bladder spasms        ASSESSMENT     Encounter Diagnosis   Name Primary?    Dysuria Yes                PLAN      PLAN: 71 year old female presents with dysuria and right-sided back pain that been ongoing over the last week or so.  She recently finished antibiotic treatment for urinary tract infection.  Patient vital signs are reassuring.  She is well-appearing.  Her exam is  without abdominal tenderness or CVA tenderness.  Her urinalysis is negative here.  Urine culture is pending.  Have discussed management with patient and will hold off on antibiotic treatment until culture is returned.  She is agreeable to this plan.  Discussed symptomatic management otherwise.  Discussed follow-up with primary care provider and urology as needed.  Acuity: Patient presented for dysuria which are new for the patient.     Radiology ordered/considered:  Considered need for imaging however not indicated as patient has no signs to suggest intraabdominal or retroperitoneal process     Tests ordered/considered:   UV and urine culture. Considered need for blood work however patient is well-appearing and suspicion for systemic illness is low.    Prescriptions:  Considered however will await culture    Records reviewed:  Record review    History obtained by other:  N/a     Systemic:  Patient's symptoms do not appear to be a manifestation of underlying systemic disease.              Orders Placed This Encounter   Procedures    Urine culture    UA     Requested Prescriptions      No prescriptions requested or ordered in this encounter       Discussed results and diagnosis with patient/family.  Reviewed warning signs for worsening condition, as well as, indications for follow-up with primary care physician and return to urgent care clinic.   Patient/family expressed understanding of instructions.     An After Visit Summary was provided to the patient.

## 2022-04-24 DIAGNOSIS — C8580 Other specified types of non-Hodgkin lymphoma, unspecified site: Secondary | ICD-10-CM | POA: Diagnosis not present

## 2022-04-24 DIAGNOSIS — E785 Hyperlipidemia, unspecified: Secondary | ICD-10-CM | POA: Diagnosis not present

## 2022-04-24 DIAGNOSIS — M810 Age-related osteoporosis without current pathological fracture: Secondary | ICD-10-CM | POA: Diagnosis not present

## 2022-04-24 DIAGNOSIS — Z0000 Encounter for general adult medical examination without abnormal findings: Secondary | ICD-10-CM | POA: Diagnosis not present

## 2022-04-25 ENCOUNTER — Other Ambulatory Visit: Payer: Self-pay | Admitting: Family Medicine

## 2022-04-25 DIAGNOSIS — M810 Age-related osteoporosis without current pathological fracture: Secondary | ICD-10-CM

## 2022-04-27 ENCOUNTER — Other Ambulatory Visit: Payer: Self-pay | Admitting: *Deleted

## 2022-04-27 ENCOUNTER — Ambulatory Visit: Payer: Medicare HMO | Admitting: Obstetrics and Gynecology

## 2022-04-27 DIAGNOSIS — C8307 Small cell B-cell lymphoma, spleen: Secondary | ICD-10-CM

## 2022-04-30 ENCOUNTER — Inpatient Hospital Stay: Payer: Medicare (Managed Care) | Attending: Hematology | Admitting: Hematology

## 2022-04-30 ENCOUNTER — Inpatient Hospital Stay: Payer: Medicare (Managed Care)

## 2022-04-30 VITALS — BP 121/58 | HR 56 | Temp 97.7°F | Resp 16 | Ht 63.0 in | Wt 135.5 lb

## 2022-04-30 DIAGNOSIS — C8300 Small cell B-cell lymphoma, unspecified site: Secondary | ICD-10-CM | POA: Insufficient documentation

## 2022-04-30 DIAGNOSIS — C8307 Small cell B-cell lymphoma, spleen: Secondary | ICD-10-CM

## 2022-04-30 DIAGNOSIS — D696 Thrombocytopenia, unspecified: Secondary | ICD-10-CM | POA: Diagnosis not present

## 2022-04-30 LAB — CBC WITH DIFFERENTIAL (CANCER CENTER ONLY)
Abs Immature Granulocytes: 0 10*3/uL (ref 0.00–0.07)
Basophils Absolute: 0 10*3/uL (ref 0.0–0.1)
Basophils Relative: 0 %
Eosinophils Absolute: 0.1 10*3/uL (ref 0.0–0.5)
Eosinophils Relative: 2 %
HCT: 42.2 % (ref 36.0–46.0)
Hemoglobin: 14.1 g/dL (ref 12.0–15.0)
Immature Granulocytes: 0 %
Lymphocytes Relative: 44 %
Lymphs Abs: 2.5 10*3/uL (ref 0.7–4.0)
MCH: 31.6 pg (ref 26.0–34.0)
MCHC: 33.4 g/dL (ref 30.0–36.0)
MCV: 94.6 fL (ref 80.0–100.0)
Monocytes Absolute: 0.4 10*3/uL (ref 0.1–1.0)
Monocytes Relative: 7 %
Neutro Abs: 2.7 10*3/uL (ref 1.7–7.7)
Neutrophils Relative %: 47 %
Platelet Count: 124 10*3/uL — ABNORMAL LOW (ref 150–400)
RBC: 4.46 MIL/uL (ref 3.87–5.11)
RDW: 13.2 % (ref 11.5–15.5)
WBC Count: 5.6 10*3/uL (ref 4.0–10.5)
nRBC: 0 % (ref 0.0–0.2)

## 2022-04-30 LAB — CMP (CANCER CENTER ONLY)
ALT: 12 U/L (ref 0–44)
AST: 17 U/L (ref 15–41)
Albumin: 4.2 g/dL (ref 3.5–5.0)
Alkaline Phosphatase: 80 U/L (ref 38–126)
Anion gap: 6 (ref 5–15)
BUN: 12 mg/dL (ref 8–23)
CO2: 29 mmol/L (ref 22–32)
Calcium: 9.5 mg/dL (ref 8.9–10.3)
Chloride: 107 mmol/L (ref 98–111)
Creatinine: 0.75 mg/dL (ref 0.44–1.00)
GFR, Estimated: >60 mL/min (ref >60)
Glucose, Bld: 96 mg/dL (ref 70–99)
Potassium: 4.5 mmol/L (ref 3.5–5.1)
Sodium: 142 mmol/L (ref 135–145)
Total Bilirubin: 0.6 mg/dL (ref 0.3–1.2)
Total Protein: 6.4 g/dL — ABNORMAL LOW (ref 6.5–8.1)

## 2022-04-30 LAB — LACTATE DEHYDROGENASE: LDH: 187 U/L (ref 98–192)

## 2022-04-30 NOTE — Progress Notes (Signed)
Joanne Miller    HEMATOLOGY/ONCOLOGY PHONE VISIT NOTE  Date of Service: 04/30/22   Patient Care Team: London Pepper, MD as PCP - General (Family Medicine)  CHIEF COMPLAINTS/PURPOSE OF CONSULTATION:   F/u for splenic marginal zone lymphoma  HISTORY OF PRESENTING ILLNESS:   Please see previous notes for details on initial presentation  INTERVAL HISTORY: Joanne Miller is a 72 y.o. female here for continued evaluation and management of her splenic marginal zone lymphoma.  I had a phone visit with the patient on 12/29/2021 and she complained of mild left upper abdominal discomfort and some episodes of hot flashes due to hormonal change.   Patient reports she has been doing well without any new medical concerns since our last visit. She recently had laparoscopic supracervical hysterectomy, laparoscopic sacral colpopexy, retropubic midurethral sling, posterior colporrhapy and anal sphincteroplasty in November 2023 without any side effects.   She complains of mild night sweats due to hormonal change, but notes that thy have been getting better.   She denies fever, chills, abdominal pain, fatigue, unexpected weight loss, new infection issues, back pain, or leg swelling.   Patient is complaint with all of her medications.  She has received her Influenza vaccine, COVID-19 Booster, and RSV vaccine. She is up to date with all of her age appropriate vaccines.  MEDICAL HISTORY:   #1 Dyslipidemia - not on any medications #2 GERD #3 hot flashes menopausal status was previously on Climara patch #4 history of sebaceous cysts. #5 history of recent left breast cysts - aspirated by ultrasound guidance and noted to be negative for cancer. #6 family history of breast cancer in her sister when she was in her 81s. No genetic testing done at that time.  SURGICAL HISTORY:  #1 breast cysts 2 aspiration on the left side . #2 root canal treatments #3 sebaceous cyst excision   SOCIAL HISTORY: Social History    Socioeconomic History   Marital status: Widowed    Spouse name: Not on file   Number of children: Not on file   Years of education: Not on file   Highest education level: Not on file  Occupational History   Not on file  Tobacco Use   Smoking status: Never   Smokeless tobacco: Never  Vaping Use   Vaping Use: Never used  Substance and Sexual Activity   Alcohol use: Yes    Alcohol/week: 1.0 standard drink of alcohol    Types: 1 Glasses of wine per week    Comment: occasional   Drug use: No   Sexual activity: Not on file  Other Topics Concern   Not on file  Social History Narrative   Not on file   Social Determinants of Health   Financial Resource Strain: Not on file  Food Insecurity: Not on file  Transportation Needs: Not on file  Physical Activity: Not on file  Stress: Not on file  Social Connections: Not on file  Intimate Partner Violence: Not on file    FAMILY HISTORY: Family History  Problem Relation Age of Onset   Breast cancer Sister 37   Breast cancer Maternal Aunt        diagnosed in her 60-70s    ALLERGIES:  is allergic to amoxicillin-pot clavulanate, medroxyprogesterone acetate, and codeine.  MEDICATIONS:  Current Outpatient Medications  Medication Sig Dispense Refill   aspirin 81 MG EC tablet Take 81 mg by mouth daily. Swallow whole.     cholecalciferol (VITAMIN D3) 25 MCG (1000 UNIT) tablet Take 1,000 Units by  mouth daily.     docusate sodium (COLACE) 100 MG capsule Take 1 capsule (100 mg total) by mouth 2 (two) times daily. 30 capsule 0   ibuprofen (ADVIL,MOTRIN) 600 MG tablet Take 1 tablet (600 mg total) by mouth every 6 (six) hours as needed. 30 tablet 0   Misc Natural Products (TART CHERRY ADVANCED PO) Take by mouth.     Omega-3 Fatty Acids (FISH OIL) 1200 MG CAPS Take by mouth.     ondansetron (ZOFRAN) 4 MG tablet Take 1 tablet (4 mg total) by mouth every 8 (eight) hours as needed for nausea or vomiting. 20 tablet 0   promethazine (PHENERGAN)  25 MG tablet Take 1 tablet (25 mg total) by mouth every 6 (six) hours as needed for nausea or vomiting. 30 tablet 0   venlafaxine XR (EFFEXOR-XR) 37.5 MG 24 hr capsule Take 1 capsule (37.5 mg total) by mouth daily with breakfast. 30 capsule 1   vitamin E 100 UNIT capsule Take by mouth daily.     No current facility-administered medications for this visit.    REVIEW OF SYSTEMS:   Improvement in left upper quadrant abdominal discomfort No other acute new symptoms since her last clinic visit  PHYSICAL EXAMINATION: .BP (!) 121/58 (BP Location: Left Arm, Patient Position: Sitting) Comment: nurse notified  Pulse (!) 56   Temp 97.7 F (36.5 C) (Temporal)   Resp 16   Ht '5\' 3"'$  (1.6 m)   Wt 135 lb 8 oz (61.5 kg)   SpO2 97%   BMI 24.00 kg/m  . GENERAL:alert, in no acute distress and comfortable SKIN: no acute rashes, no significant lesions EYES: conjunctiva are pink and non-injected, sclera anicteric OROPHARYNX: MMM, no exudates, no oropharyngeal erythema or ulceration NECK: supple, no JVD LYMPH:  no palpable lymphadenopathy in the cervical, axillary or inguinal regions LUNGS: clear to auscultation b/l with normal respiratory effort HEART: regular rate & rhythm ABDOMEN:  normoactive bowel sounds , non tender, not distended. Just palpable splenomegaly. Extremity: no pedal edema PSYCH: alert & oriented x 3 with fluent speech NEURO: no focal motor/sensory deficits  LABORATORY DATA:  I have reviewed the data as listed  .    Latest Ref Rng & Units 04/30/2022    8:27 AM 03/02/2022    5:38 PM 12/12/2021    8:23 AM  CBC  WBC 4.0 - 10.5 K/uL 5.6  6.7  4.8   Hemoglobin 12.0 - 15.0 g/dL 14.1  12.7  15.0   Hematocrit 36.0 - 46.0 % 42.2  38.4  43.7   Platelets 150 - 400 K/uL 124  141  103    . CBC    Component Value Date/Time   WBC 5.6 04/30/2022 0827   WBC 6.7 03/02/2022 1738   RBC 4.46 04/30/2022 0827   HGB 14.1 04/30/2022 0827   HGB 14.0 03/22/2017 0950   HCT 42.2 04/30/2022 0827    HCT 41.9 03/22/2017 0950   PLT 124 (L) 04/30/2022 0827   PLT 136 (L) 03/22/2017 0950   MCV 94.6 04/30/2022 0827   MCV 93.3 03/22/2017 0950   MCH 31.6 04/30/2022 0827   MCHC 33.4 04/30/2022 0827   RDW 13.2 04/30/2022 0827   RDW 15.0 (H) 03/22/2017 0950   LYMPHSABS 2.5 04/30/2022 0827   LYMPHSABS 1.5 03/22/2017 0950   MONOABS 0.4 04/30/2022 0827   MONOABS 0.3 03/22/2017 0950   EOSABS 0.1 04/30/2022 0827   EOSABS 0.1 03/22/2017 0950   BASOSABS 0.0 04/30/2022 0827   BASOSABS 0.0 03/22/2017 0950    .  Latest Ref Rng & Units 04/30/2022    8:27 AM 03/02/2022    5:38 PM 12/12/2021    8:23 AM  CMP  Glucose 70 - 99 mg/dL 96  115  96   BUN 8 - 23 mg/dL '12  11  15   '$ Creatinine 0.44 - 1.00 mg/dL 0.75  0.74  0.81   Sodium 135 - 145 mmol/L 142  136  143   Potassium 3.5 - 5.1 mmol/L 4.5  3.9  3.7   Chloride 98 - 111 mmol/L 107  101  109   CO2 22 - 32 mmol/L '29  25  26   '$ Calcium 8.9 - 10.3 mg/dL 9.5  9.0  9.7   Total Protein 6.5 - 8.1 g/dL 6.4  6.2  6.6   Total Bilirubin 0.3 - 1.2 mg/dL 0.6  0.9  0.6   Alkaline Phos 38 - 126 U/L 80  69  67   AST 15 - 41 U/L '17  21  18   '$ ALT 0 - 44 U/L '12  29  14    '$ . Lab Results  Component Value Date   LDH 187 04/30/2022        Hematopathology Order10/03/2017 [place]  BONE MARROW BIOPSY 01/25/2017: Interpretation Fourteen of the twenty metaphase cells analyzed are normal (46,XX). Six cells are abnormal and contain a deletion within the long arm of chromosome 7 and additional material of unknown origin attached to the long arm of chromosome 14 at or near the [facility] locus. Deletion of 7q is seen in mature B-cell neoplasms including marginal zone lymphoma. Ten cells were analyzed from the 24 hour unstimulated culture and ten cells were analyzed from the 72 hour culture stimulated with pokeweed mitogen, phorbol 12-myristate 13-acetate (PMA), and a CpG-oligodeoxynucleotide (ODN). The abnormal clone was only seen in the 72 hour stimulated  culture.   Component Name Value Ref Range  Case Report Surgical Pathology Report                         Case: [id number]                                Authorizing Provider:  Rulon Sera,  Collected:           01/25/2017 1100                                    AGNP                                                                        Ordering Location:     [facility]      Received:            01/25/2017 [place]  Pathologist:           Trudee Kuster,                                                                          MD                                                                          Specimens:   A) - Bone Marrow Right - Aspirate                                                                  B) - Bone Marrow Right - Biopsy                                                                    C) - Peripheral Blood                                                                    Final Diagnosis Bone marrow, right iliac, aspiration and biopsy -  Hypercellular bone marrow (70%) with involvement by mature B-cell lymphoma with non-specific immunophenotype (representing approximately 20% of marrow cellularity by immunohistochemical analysis) (See Comment)  -  Cytogenetic studies are pending.     RADIOGRAPHIC STUDIES: I have personally reviewed the radiological images as listed and agreed with the findings in the report. No results found.  ASSESSMENT & PLAN:   72 y.o. female with   1) history of splenic marginal zone lymphoma. Presented with splenomegaly and cytopenia with constitutional sypmtoms fatigue and night sweats - now resolved with Rituxan treatment. HIV, hepatitis C and hepatitis B serologies unrevealing  SPEP shows no M spike and negative IFE.  BM Bx done at [facility] -  Hypercellular bone marrow (70%) with involvement by mature  B-cell lymphoma with non-specific immunophenotype (representing approximately 20% of marrow cellularity by immunohistochemical analysis) Cytogenetics 7q deletion  2) Hot flashes grade 2  Patient declines consideration of medications for this.  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  Minimal and stable.  4) history of Rituxan related Rash - grade 1. Resolved and did not recur with dose 2 and 3 and 4 with adjusted premedications.  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 Patient notes that her sister had breast cancer in her 9s . Has not had genetic testing . No other family history of breast cancer ovarian cancer pancreatic cancer or melanoma . She has had recent left breast cysts that were aspirated in [place] and per her report were noted to be benign . -yrly MMG with PCP (last done 12/31/2017) - negative   7) history of depression was previously on Lexapro currently not on any medications . Lost her husband 2009 to throat cancer .  PLAN: -Discussed lab results from today, 04/30/2022, with the patient. CBC and CMP are stable. -LDH 187 - wnl -Answered all patient's questions.  -Currently she has no significant cytopenias other than some mild thrombocytopenia. -No significant related constitutional symptoms at this time. -No new fatigue or weight loss. She would like to continue closely monitoring the situation would not prefer to retreated with Rituxan at this time which is appropriate.     FOLLOW-UP: Return to clinic with Dr. Irene Limbo with labs in 6 months  The total time spent in the appointment was 20 minutes* .  All of the patient's questions were answered with apparent satisfaction. The patient knows to call the clinic with any problems, questions or concerns.   Sullivan Lone MD MS AAHIVMS [facility] Hematology/Oncology Physician [facility]  .*Total Encounter Time as  defined by the Centers for Medicare and Medicaid Services includes, in addition to the face-to-face time of a patient visit (documented in the note above) non-face-to-face time: obtaining and reviewing outside history, ordering and reviewing medications, tests or procedures, care coordination (communications with other health care professionals or caregivers) and documentation in the medical record.   I, Cleda Mccreedy, am acting as a Education administrator for Sullivan Lone, MD.  .I have reviewed the above documentation for accuracy and completeness, and I agree with the above. Brunetta Genera MD

## 2022-06-04 ENCOUNTER — Encounter: Payer: Self-pay | Admitting: *Deleted

## 2022-06-07 DIAGNOSIS — H2513 Age-related nuclear cataract, bilateral: Secondary | ICD-10-CM | POA: Diagnosis not present

## 2022-08-02 ENCOUNTER — Other Ambulatory Visit: Payer: Medicare (Managed Care)

## 2022-09-12 DIAGNOSIS — N390 Urinary tract infection, site not specified: Secondary | ICD-10-CM | POA: Diagnosis not present

## 2022-09-18 DIAGNOSIS — E785 Hyperlipidemia, unspecified: Secondary | ICD-10-CM | POA: Diagnosis not present

## 2022-09-18 DIAGNOSIS — D696 Thrombocytopenia, unspecified: Secondary | ICD-10-CM | POA: Diagnosis not present

## 2022-09-18 DIAGNOSIS — C8307 Small cell B-cell lymphoma, spleen: Secondary | ICD-10-CM | POA: Diagnosis not present

## 2022-09-18 DIAGNOSIS — F325 Major depressive disorder, single episode, in full remission: Secondary | ICD-10-CM | POA: Diagnosis not present

## 2022-09-18 DIAGNOSIS — M810 Age-related osteoporosis without current pathological fracture: Secondary | ICD-10-CM | POA: Diagnosis not present

## 2022-10-05 ENCOUNTER — Other Ambulatory Visit: Payer: Self-pay

## 2022-10-05 DIAGNOSIS — C8307 Small cell B-cell lymphoma, spleen: Secondary | ICD-10-CM

## 2022-10-08 ENCOUNTER — Telehealth: Payer: Self-pay | Admitting: Hematology

## 2022-10-08 ENCOUNTER — Other Ambulatory Visit: Payer: Self-pay

## 2022-10-08 ENCOUNTER — Inpatient Hospital Stay: Payer: Medicare (Managed Care)

## 2022-10-08 ENCOUNTER — Encounter: Payer: Self-pay | Admitting: Hematology

## 2022-10-08 ENCOUNTER — Inpatient Hospital Stay: Payer: Medicare (Managed Care) | Attending: Hematology | Admitting: Hematology

## 2022-10-08 VITALS — BP 123/48 | HR 61 | Temp 98.2°F | Resp 18 | Wt 133.8 lb

## 2022-10-08 DIAGNOSIS — D696 Thrombocytopenia, unspecified: Secondary | ICD-10-CM | POA: Diagnosis not present

## 2022-10-08 DIAGNOSIS — Z7189 Other specified counseling: Secondary | ICD-10-CM | POA: Diagnosis not present

## 2022-10-08 DIAGNOSIS — Z8572 Personal history of non-Hodgkin lymphomas: Secondary | ICD-10-CM | POA: Insufficient documentation

## 2022-10-08 DIAGNOSIS — N951 Menopausal and female climacteric states: Secondary | ICD-10-CM | POA: Diagnosis not present

## 2022-10-08 DIAGNOSIS — Z803 Family history of malignant neoplasm of breast: Secondary | ICD-10-CM | POA: Insufficient documentation

## 2022-10-08 DIAGNOSIS — C8307 Small cell B-cell lymphoma, spleen: Secondary | ICD-10-CM

## 2022-10-08 LAB — CBC WITH DIFFERENTIAL (CANCER CENTER ONLY)
Abs Immature Granulocytes: 0.01 10*3/uL (ref 0.00–0.07)
Basophils Absolute: 0 10*3/uL (ref 0.0–0.1)
Basophils Relative: 1 %
Eosinophils Absolute: 0.1 10*3/uL (ref 0.0–0.5)
Eosinophils Relative: 1 %
HCT: 43.6 % (ref 36.0–46.0)
Hemoglobin: 14.4 g/dL (ref 12.0–15.0)
Immature Granulocytes: 0 %
Lymphocytes Relative: 41 %
Lymphs Abs: 2.4 10*3/uL (ref 0.7–4.0)
MCH: 31.1 pg (ref 26.0–34.0)
MCHC: 33 g/dL (ref 30.0–36.0)
MCV: 94.2 fL (ref 80.0–100.0)
Monocytes Absolute: 0.3 10*3/uL (ref 0.1–1.0)
Monocytes Relative: 5 %
Neutro Abs: 2.9 10*3/uL (ref 1.7–7.7)
Neutrophils Relative %: 52 %
Platelet Count: 88 10*3/uL — ABNORMAL LOW (ref 150–400)
RBC: 4.63 MIL/uL (ref 3.87–5.11)
RDW: 13.2 % (ref 11.5–15.5)
Smear Review: NORMAL
WBC Count: 5.7 10*3/uL (ref 4.0–10.5)
nRBC: 0 % (ref 0.0–0.2)

## 2022-10-08 LAB — CMP (CANCER CENTER ONLY)
ALT: 11 U/L (ref 0–44)
AST: 16 U/L (ref 15–41)
Albumin: 4.3 g/dL (ref 3.5–5.0)
Alkaline Phosphatase: 64 U/L (ref 38–126)
Anion gap: 7 (ref 5–15)
BUN: 15 mg/dL (ref 8–23)
CO2: 30 mmol/L (ref 22–32)
Calcium: 10 mg/dL (ref 8.9–10.3)
Chloride: 106 mmol/L (ref 98–111)
Creatinine: 0.93 mg/dL (ref 0.44–1.00)
GFR, Estimated: >60 mL/min (ref >60)
Glucose, Bld: 87 mg/dL (ref 70–99)
Potassium: 4.2 mmol/L (ref 3.5–5.1)
Sodium: 143 mmol/L (ref 135–145)
Total Bilirubin: 0.7 mg/dL (ref 0.3–1.2)
Total Protein: 6.2 g/dL — ABNORMAL LOW (ref 6.5–8.1)

## 2022-10-08 LAB — LACTATE DEHYDROGENASE: LDH: 179 U/L (ref 98–192)

## 2022-10-08 NOTE — Progress Notes (Signed)
[name]    HEMATOLOGY/ONCOLOGY CLINIC VISIT NOTE  Date of Service: 10/08/22   Patient Care Team: [name], MD as PCP - General (Family Medicine)  CHIEF COMPLAINTS/PURPOSE OF CONSULTATION:   F/u for splenic marginal zone lymphoma  HISTORY OF PRESENTING ILLNESS:   Please see previous notes for details on initial presentation  INTERVAL HISTORY:  [name] is a 72 y.o. female here for continued evaluation and management of her splenic marginal zone lymphoma. Patient was last seen by me on 04/30/2022 and complained of mild night sweats.   Patient reports she has been doing well. She notes abdominal discomfort that she attributes to her spleen growing. She reports occasional pain similar to cramps when exercising, and continuous dragging pain.   She notes night sweats during the day and night that occurs more often. Patient also complains of fatigue in the afternoons. She takes naps in the afternoons that improve fatigue.  Patient reports increased thickness in the neck. She complains of pain in the right lower quadrant of the abdomen, worsened upon palpation. Patient notes her stool is lighter and looser since 2 weeks ago that occurs everyday. She denies any new medications or changes in eating habits.   She denies fever, chills, unexplained bleeding, unexpected weight loss, new infection issues, back pain, or leg swelling. Patient notes she recently traveled out of the country and will be on vacation at the beach starting 10/13/2022.   MEDICAL HISTORY:   #1 Dyslipidemia - not on any medications #2 GERD #3 hot flashes menopausal status was previously on Climara patch #4 history of sebaceous cysts. #5 history of recent left breast cysts - aspirated by ultrasound guidance and noted to be negative for cancer. #6 family history of breast cancer in her sister when she was in her 30s. No genetic testing done at that time.  SURGICAL HISTORY:  #1 breast cysts 2 aspiration on the left side  . #2 root canal treatments #3 sebaceous cyst excision   SOCIAL HISTORY: Social History   Socioeconomic History   Marital status: Widowed    Spouse name: Not on file   Number of children: Not on file   Years of education: Not on file   Highest education level: Not on file  Occupational History   Not on file  Tobacco Use   Smoking status: Never   Smokeless tobacco: Never  Vaping Use   Vaping Use: Never used  Substance and Sexual Activity   Alcohol use: Yes    Alcohol/week: 1.0 standard drink of alcohol    Types: 1 Glasses of wine per week    Comment: occasional   Drug use: No   Sexual activity: Not on file  Other Topics Concern   Not on file  Social History Narrative   Not on file   Social Determinants of Health   Financial Resource Strain: Not on file  Food Insecurity: Not on file  Transportation Needs: Not on file  Physical Activity: Not on file  Stress: Not on file  Social Connections: Not on file  Intimate Partner Violence: Not on file    FAMILY HISTORY: Family History  Problem Relation Age of Onset   Breast cancer Sister 49   Breast cancer Maternal Aunt        diagnosed in her 60-70s    ALLERGIES:  is allergic to amoxicillin-pot clavulanate, medroxyprogesterone acetate, and codeine.  MEDICATIONS:  Current Outpatient Medications  Medication Sig Dispense Refill   aspirin 81 MG EC tablet Take 81 mg  by mouth daily. Swallow whole.     cholecalciferol (VITAMIN D3) 25 MCG (1000 UNIT) tablet Take 1,000 Units by mouth daily.     docusate sodium (COLACE) 100 MG capsule Take 1 capsule (100 mg total) by mouth 2 (two) times daily. 30 capsule 0   ibuprofen (ADVIL,MOTRIN) 600 MG tablet Take 1 tablet (600 mg total) by mouth every 6 (six) hours as needed. 30 tablet 0   Misc Natural Products (TART CHERRY ADVANCED PO) Take by mouth.     Omega-3 Fatty Acids (FISH OIL) 1200 MG CAPS Take by mouth.     ondansetron (ZOFRAN) 4 MG tablet Take 1 tablet (4 mg total) by mouth  every 8 (eight) hours as needed for nausea or vomiting. 20 tablet 0   promethazine (PHENERGAN) 25 MG tablet Take 1 tablet (25 mg total) by mouth every 6 (six) hours as needed for nausea or vomiting. 30 tablet 0   venlafaxine XR (EFFEXOR-XR) 37.5 MG 24 hr capsule Take 1 capsule (37.5 mg total) by mouth daily with breakfast. 30 capsule 1   vitamin E 100 UNIT capsule Take by mouth daily.     No current facility-administered medications for this visit.    REVIEW OF SYSTEMS:   Improvement in left upper quadrant abdominal discomfort No other acute new symptoms since her last clinic visit  PHYSICAL EXAMINATION: .BP (!) 123/48   Pulse 61   Temp 98.2 F (36.8 C)   Resp 18   Wt 133 lb 12.8 oz (60.7 kg)   SpO2 98%   BMI 23.70 kg/m  . GENERAL:alert, in no acute distress and comfortable SKIN: no acute rashes, no significant lesions EYES: conjunctiva are pink and non-injected, sclera anicteric OROPHARYNX: MMM, no exudates, no oropharyngeal erythema or ulceration NECK: supple, no JVD LYMPH:  no palpable lymphadenopathy in the cervical, axillary or inguinal regions LUNGS: clear to auscultation b/l with normal respiratory effort HEART: regular rate & rhythm ABDOMEN:  normoactive bowel sounds , non tender, not distended. Just palpable splenomegaly. Extremity: no pedal edema PSYCH: alert & oriented x 3 with fluent speech NEURO: no focal motor/sensory deficits  LABORATORY DATA:  I have reviewed the data as listed  .    Latest Ref Rng & Units 10/08/2022    9:26 AM 04/30/2022    8:27 AM 03/02/2022    5:38 PM  CBC  WBC 4.0 - 10.5 K/uL 5.7  5.6  6.7   Hemoglobin 12.0 - 15.0 g/dL 16.1  09.6  04.5   Hematocrit 36.0 - 46.0 % 43.6  42.2  38.4   Platelets 150 - 400 K/uL 88  124  141    . CBC    Component Value Date/Time   WBC 5.7 10/08/2022 0926   WBC 6.7 03/02/2022 1738   RBC 4.63 10/08/2022 0926   HGB 14.4 10/08/2022 0926   HGB 14.0 03/22/2017 0950   HCT 43.6 10/08/2022 0926   HCT  41.9 03/22/2017 0950   PLT 88 (L) 10/08/2022 0926   PLT 136 (L) 03/22/2017 0950   MCV 94.2 10/08/2022 0926   MCV 93.3 03/22/2017 0950   MCH 31.1 10/08/2022 0926   MCHC 33.0 10/08/2022 0926   RDW 13.2 10/08/2022 0926   RDW 15.0 (H) 03/22/2017 0950   LYMPHSABS 2.4 10/08/2022 0926   LYMPHSABS 1.5 03/22/2017 0950   MONOABS 0.3 10/08/2022 0926   MONOABS 0.3 03/22/2017 0950   EOSABS 0.1 10/08/2022 0926   EOSABS 0.1 03/22/2017 0950   BASOSABS 0.0 10/08/2022 0926   BASOSABS  0.0 03/22/2017 0950    .    Latest Ref Rng & Units 10/08/2022    9:26 AM 04/30/2022    8:27 AM 03/02/2022    5:38 PM  CMP  Glucose 70 - 99 mg/dL 87  96  409   BUN 8 - 23 mg/dL 15  12  11    Creatinine 0.44 - 1.00 mg/dL 8.11  9.14  7.82   Sodium 135 - 145 mmol/L 143  142  136   Potassium 3.5 - 5.1 mmol/L 4.2  4.5  3.9   Chloride 98 - 111 mmol/L 106  107  101   CO2 22 - 32 mmol/L 30  29  25    Calcium 8.9 - 10.3 mg/dL 95.6  9.5  9.0   Total Protein 6.5 - 8.1 g/dL 6.2  6.4  6.2   Total Bilirubin 0.3 - 1.2 mg/dL 0.7  0.6  0.9   Alkaline Phos 38 - 126 U/L 64  80  69   AST 15 - 41 U/L 16  17  21    ALT 0 - 44 U/L 11  12  29     . Lab Results  Component Value Date   LDH 179 10/08/2022        Hematopathology Order10/03/2017 [facility]  BONE MARROW BIOPSY 01/25/2017: Interpretation Fourteen of the twenty metaphase cells analyzed are normal (46,XX). Six cells are abnormal and contain a deletion within the long arm of chromosome 7 and additional material of unknown origin attached to the long arm of chromosome 14 at or near the [facility] locus. Deletion of 7q is seen in mature B-cell neoplasms including marginal zone lymphoma. Ten cells were analyzed from the 24 hour unstimulated culture and ten cells were analyzed from the 72 hour culture stimulated with pokeweed mitogen, phorbol 12-myristate 13-acetate (PMA), and a CpG-oligodeoxynucleotide (ODN). The abnormal clone was only seen in the 72 hour stimulated culture.    Component Name Value Ref Range  Case Report Surgical Pathology Report                         Case: [id number]                                Authorizing Provider:  Vernie Murders,  Collected:           01/25/2017 1100                                    AGNP                                                                        Ordering Location:     [facility]      Received:            01/25/2017 1118                                    [place]  Pathologist:           Sondra Come,                                                                          MD                                                                          Specimens:   A) - Bone Marrow Right - Aspirate                                                                  B) - Bone Marrow Right - Biopsy                                                                    C) - Peripheral Blood                                                                    Final Diagnosis Bone marrow, right iliac, aspiration and biopsy -  Hypercellular bone marrow (70%) with involvement by mature B-cell lymphoma with non-specific immunophenotype (representing approximately 20% of marrow cellularity by immunohistochemical analysis) (See Comment)  -  Cytogenetic studies are pending.     RADIOGRAPHIC STUDIES: I have personally reviewed the radiological images as listed and agreed with the findings in the report. No results found.  ASSESSMENT & PLAN:   72 y.o. female with   1) history of splenic marginal zone lymphoma. Presented with splenomegaly and cytopenia with constitutional sypmtoms fatigue and night sweats - now resolved with Rituxan treatment. HIV, hepatitis C and hepatitis B serologies unrevealing  SPEP shows no M spike and negative IFE.  BM [facility]x [facility] -  Hypercellular bone marrow (70%) with involvement by mature B-cell  lymphoma with non-specific immunophenotype (representing approximately 20% of marrow cellularity by immunohistochemical analysis) Cytogenetics 7q deletion  2) Hot flashes grade 2  Patient declines consideration of medications for this.  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  Minimal and stable.  4) history of Rituxan related Rash - grade 1. Resolved and did not recur with dose 2 and 3 and 4 with adjusted premedications.  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 Patient notes that her sister had breast cancer in her 30s . Has not had genetic testing . No other family history of breast cancer ovarian cancer pancreatic cancer or melanoma . She has had recent left breast cysts that were aspirated in [place] and per her report were noted to be benign . -yrly MMG with PCP (last done 12/31/2017) - negative   7) history of depression was previously on Lexapro currently not on any medications . Lost her husband 2009 to throat cancer .  PLAN: -Discussed labs from today, 10/08/2022, in detail with patient. CBC shows WBC normal at 5.7 K, hemoglobin at 14.4, and platelets decreased at 88 K -No lymphocytosis -CMP and LDH WNL -Last scan was 6 months ago, where spleen was 13 cm, which has enlarged since then -Will order chest/abdomen/pelvis CT scan to confirm enlarged spleen -Discussed the option of starting Rituxan once a week for 4 doses and doing a scan after around 2 months, and doing a year's worth of maintenance dose of Rituxan to manage the enlarged spleen and further reduce size of spleen -Discussed the option of patient being given steroids to improve discomfort in abdomen -Patient will not receive steroids -Patient will start weekly Rituxan on 07/15 -- ordered placed for integrated scheduling. -Educated patient that pain in the abdomen is likely due to changes in bowel habits and enlarged  spleen -Educated patient on the cause of her enlarged spleen and what cells are a part of it, as well as medication that can improve an enlarged spleen  -Recommended patient to take probiotics and continue to drink kombucha  -Answered all of patient's questions    FOLLOW-UP: -Ct chest abd pelvis in 1-2 weeks -Schedule to start Rituxan weekly x 4 doses from first dose 7/16(patient out of town for 2 weeks prior to that)  -labs with each treatment - MD visit with C1 review CT -Karena Addison with C2 for toxicity (I am on PAL) visit with C4 with labs  The total time spent in the appointment was 32 minutes* .  All of the patient's questions were answered with apparent satisfaction. The patient knows to call the clinic with any problems, questions or concerns.   Wyvonnia Lora MD MS AAHIVMS [facility] Hematology/Oncology Physician [facility]  .*Total Encounter Time as defined by the Centers for Medicare and Medicaid Services includes, in addition to the face-to-face time of a patient visit (documented in the note above) non-face-to-face time: obtaining and reviewing outside history, ordering and reviewing medications, tests or procedures, care coordination (communications with other health care professionals or caregivers) and documentation in the medical record.   Ellouise Newer, acting as a Neurosurgeon for Wyvonnia Lora, MD.,have documented all relevant documentation on the behalf of Wyvonnia Lora, MD,as directed by  Wyvonnia Lora, MD while in the presence of Wyvonnia Lora, MD.  .I have reviewed the above documentation for accuracy and completeness, and I agree with the above. Johney Maine MD

## 2022-10-09 ENCOUNTER — Other Ambulatory Visit: Payer: Self-pay

## 2022-10-10 ENCOUNTER — Ambulatory Visit (HOSPITAL_COMMUNITY)
Admission: RE | Admit: 2022-10-10 | Discharge: 2022-10-10 | Disposition: A | Discharge status: Home / Self Care | Payer: Medicare (Managed Care) | Source: Ambulatory Visit | Attending: Hematology | Admitting: Hematology

## 2022-10-10 DIAGNOSIS — D696 Thrombocytopenia, unspecified: Secondary | ICD-10-CM | POA: Diagnosis not present

## 2022-10-10 DIAGNOSIS — I700 Atherosclerosis of aorta: Secondary | ICD-10-CM | POA: Diagnosis not present

## 2022-10-10 DIAGNOSIS — C8307 Small cell B-cell lymphoma, spleen: Secondary | ICD-10-CM | POA: Insufficient documentation

## 2022-10-10 DIAGNOSIS — R161 Splenomegaly, not elsewhere classified: Secondary | ICD-10-CM | POA: Diagnosis not present

## 2022-10-10 DIAGNOSIS — C8590 Non-Hodgkin lymphoma, unspecified, unspecified site: Secondary | ICD-10-CM | POA: Diagnosis not present

## 2022-10-10 MED ORDER — IOHEXOL 9 MG/ML PO SOLN
ORAL | Status: AC
  Filled 2022-10-10: qty 1000

## 2022-10-10 MED ORDER — IOHEXOL 300 MG/ML  SOLN
100.0000 mL | Freq: Once | INTRAMUSCULAR | Status: AC | PRN
  Administered 2022-10-10: 100 mL via INTRAVENOUS

## 2022-10-10 MED ORDER — IOHEXOL 9 MG/ML PO SOLN
1000.0000 mL | ORAL | Status: AC
  Administered 2022-10-10: 1000 mL via ORAL

## 2022-10-14 ENCOUNTER — Encounter: Payer: Self-pay | Admitting: Hematology

## 2022-10-15 ENCOUNTER — Other Ambulatory Visit: Payer: Self-pay | Admitting: Hematology

## 2022-10-15 MED ORDER — PREDNISONE 20 MG PO TABS: 60.0000 mg | ORAL_TABLET | Freq: Every day | ORAL | 0 refills | Status: AC

## 2022-10-24 NOTE — Progress Notes (Addendum)
Pharmacist Chemotherapy Monitoring - Initial Assessment    Anticipated start date: 10/31/22   The following has been reviewed per standard work regarding the patient's treatment regimen: The patient's diagnosis, treatment plan and drug doses, and organ/hematologic function Lab orders and baseline tests specific to treatment regimen  The treatment plan start date, drug sequencing, and pre-medications Prior authorization status  Patient's documented medication list, including drug-drug interaction screen and prescriptions for anti-emetics and supportive care specific to the treatment regimen The drug concentrations, fluid compatibility, administration routes, and timing of the medications to be used The patient's access for treatment and lifetime cumulative dose history, if applicable  The patient's medication allergies and previous infusion related reactions, if applicable   Changes made to treatment plan:  treatment plan date  Follow up needed:  N/A    Richardean Sale, RPH, BCPS, BCOP 10/24/2022  4:37 PM

## 2022-10-30 ENCOUNTER — Other Ambulatory Visit: Payer: Self-pay

## 2022-10-30 ENCOUNTER — Encounter: Payer: Self-pay | Admitting: Hematology

## 2022-10-30 DIAGNOSIS — C8307 Small cell B-cell lymphoma, spleen: Secondary | ICD-10-CM

## 2022-10-31 ENCOUNTER — Inpatient Hospital Stay: Payer: Medicare (Managed Care) | Admitting: Hematology

## 2022-10-31 ENCOUNTER — Ambulatory Visit (HOSPITAL_BASED_OUTPATIENT_CLINIC_OR_DEPARTMENT_OTHER): Payer: Medicare (Managed Care) | Admitting: Physician Assistant

## 2022-10-31 ENCOUNTER — Inpatient Hospital Stay: Payer: Medicare (Managed Care)

## 2022-10-31 ENCOUNTER — Other Ambulatory Visit: Payer: Self-pay

## 2022-10-31 ENCOUNTER — Inpatient Hospital Stay: Payer: Medicare (Managed Care) | Attending: Hematology

## 2022-10-31 ENCOUNTER — Other Ambulatory Visit: Payer: Self-pay | Admitting: Hematology

## 2022-10-31 VITALS — BP 118/58 | HR 68 | Temp 98.4°F | Resp 18 | Wt 133.5 lb

## 2022-10-31 DIAGNOSIS — C8307 Small cell B-cell lymphoma, spleen: Secondary | ICD-10-CM | POA: Insufficient documentation

## 2022-10-31 DIAGNOSIS — Z5112 Encounter for antineoplastic immunotherapy: Secondary | ICD-10-CM | POA: Insufficient documentation

## 2022-10-31 DIAGNOSIS — N649 Disorder of breast, unspecified: Secondary | ICD-10-CM

## 2022-10-31 DIAGNOSIS — Z7189 Other specified counseling: Secondary | ICD-10-CM | POA: Diagnosis not present

## 2022-10-31 DIAGNOSIS — Z79620 Long term (current) use of immunosuppressive biologic: Secondary | ICD-10-CM | POA: Insufficient documentation

## 2022-10-31 DIAGNOSIS — T50905A Adverse effect of unspecified drugs, medicaments and biological substances, initial encounter: Secondary | ICD-10-CM

## 2022-10-31 LAB — CBC WITH DIFFERENTIAL (CANCER CENTER ONLY)
Abs Immature Granulocytes: 0.01 10*3/uL (ref 0.00–0.07)
Basophils Absolute: 0 10*3/uL (ref 0.0–0.1)
Basophils Relative: 1 %
Eosinophils Absolute: 0.1 10*3/uL (ref 0.0–0.5)
Eosinophils Relative: 1 %
HCT: 40.7 % (ref 36.0–46.0)
Hemoglobin: 13.8 g/dL (ref 12.0–15.0)
Immature Granulocytes: 0 %
Lymphocytes Relative: 40 %
Lymphs Abs: 2.5 10*3/uL (ref 0.7–4.0)
MCH: 31.2 pg (ref 26.0–34.0)
MCHC: 33.9 g/dL (ref 30.0–36.0)
MCV: 92.1 fL (ref 80.0–100.0)
Monocytes Absolute: 0.3 10*3/uL (ref 0.1–1.0)
Monocytes Relative: 5 %
Neutro Abs: 3.3 10*3/uL (ref 1.7–7.7)
Neutrophils Relative %: 53 %
Platelet Count: 87 10*3/uL — ABNORMAL LOW (ref 150–400)
RBC: 4.42 MIL/uL (ref 3.87–5.11)
RDW: 13.2 % (ref 11.5–15.5)
Smear Review: NORMAL
WBC Count: 6.3 10*3/uL (ref 4.0–10.5)
nRBC: 0 % (ref 0.0–0.2)

## 2022-10-31 LAB — CMP (CANCER CENTER ONLY)
ALT: 13 U/L (ref 0–44)
AST: 15 U/L (ref 15–41)
Albumin: 4.2 g/dL (ref 3.5–5.0)
Alkaline Phosphatase: 66 U/L (ref 38–126)
Anion gap: 8 (ref 5–15)
BUN: 19 mg/dL (ref 8–23)
CO2: 25 mmol/L (ref 22–32)
Calcium: 9.6 mg/dL (ref 8.9–10.3)
Chloride: 109 mmol/L (ref 98–111)
Creatinine: 0.81 mg/dL (ref 0.44–1.00)
GFR, Estimated: >60 mL/min (ref >60)
Glucose, Bld: 94 mg/dL (ref 70–99)
Potassium: 3.9 mmol/L (ref 3.5–5.1)
Sodium: 142 mmol/L (ref 135–145)
Total Bilirubin: 0.5 mg/dL (ref 0.3–1.2)
Total Protein: 6.3 g/dL — ABNORMAL LOW (ref 6.5–8.1)

## 2022-10-31 LAB — LACTATE DEHYDROGENASE: LDH: 185 U/L (ref 98–192)

## 2022-10-31 LAB — HEPATITIS B CORE ANTIBODY, TOTAL: Hep B Core Total Ab: NONREACTIVE

## 2022-10-31 MED ORDER — ACETAMINOPHEN 325 MG PO TABS
650.0000 mg | ORAL_TABLET | Freq: Once | ORAL | Status: AC
  Administered 2022-10-31: 650 mg via ORAL
  Filled 2022-10-31: qty 2

## 2022-10-31 MED ORDER — SODIUM CHLORIDE 0.9 % IV SOLN
375.0000 mg/m2 | Freq: Once | INTRAVENOUS | Status: AC
  Administered 2022-10-31: 600 mg via INTRAVENOUS
  Filled 2022-10-31: qty 50

## 2022-10-31 MED ORDER — METHYLPREDNISOLONE SODIUM SUCC 125 MG IJ SOLR
125.0000 mg | Freq: Once | INTRAMUSCULAR | Status: AC | PRN
  Administered 2022-10-31: 60 mg via INTRAVENOUS

## 2022-10-31 MED ORDER — ONDANSETRON HCL 4 MG PO TABS: 4.0000 mg | ORAL_TABLET | Freq: Three times a day (TID) | ORAL | 0 refills | Status: DC | PRN

## 2022-10-31 MED ORDER — FAMOTIDINE IN NACL 20-0.9 MG/50ML-% IV SOLN
20.0000 mg | Freq: Once | INTRAVENOUS | Status: AC | PRN
  Administered 2022-10-31: 20 mg via INTRAVENOUS

## 2022-10-31 MED ORDER — SODIUM CHLORIDE 0.9 % IV SOLN: Freq: Once | INTRAVENOUS | Status: AC

## 2022-10-31 MED ORDER — METHYLPREDNISOLONE SODIUM SUCC 125 MG IJ SOLR
125.0000 mg | Freq: Once | INTRAMUSCULAR | Status: AC
  Administered 2022-10-31: 125 mg via INTRAVENOUS
  Filled 2022-10-31: qty 2

## 2022-10-31 MED ORDER — FAMOTIDINE IN NACL 20-0.9 MG/50ML-% IV SOLN
20.0000 mg | Freq: Once | INTRAVENOUS | Status: AC
  Administered 2022-10-31: 20 mg via INTRAVENOUS
  Filled 2022-10-31: qty 50

## 2022-10-31 MED ORDER — CETIRIZINE HCL 10 MG/ML IV SOLN
10.0000 mg | Freq: Once | INTRAVENOUS | Status: AC
  Administered 2022-10-31: 10 mg via INTRAVENOUS
  Filled 2022-10-31: qty 1

## 2022-10-31 NOTE — Progress Notes (Signed)
HEMATOLOGY/ONCOLOGY CLINIC VISIT NOTE  Date of Service: 10/31/22   Patient Care Team: [name], MD as PCP - General (Family Medicine)  CHIEF COMPLAINTS/PURPOSE OF CONSULTATION:   F/u for splenic marginal zone lymphoma  HISTORY OF PRESENTING ILLNESS:   Please see previous notes for details on initial presentation  INTERVAL HISTORY:  [name] is a 72 y.o. female here for continued evaluation and management of her splenic marginal zone lymphoma. Patient was last seen by me on 10/08/2022 and complained of abdominal discomfort, increased night sweats, fatigue, loose stools, and neck swelling.  Today, she is accompanied by a family member. Patient reports that Prednisone use did improve abdominal pain from her splenomegaly. She does complain of left lower abdominal pain at this time. Patient reports increased fatigue. She denies any leg swelling.  She complains of left breast discomfort present for a couple years with worsened aching recently. She denies any injuries or obvious trauma to the area. Patient was previously told that it was a cyst. Patient reports that fluid was removed from benign cyst in her left breast in 2013/14.  MEDICAL HISTORY:   #1 Dyslipidemia - not on any medications #2 GERD #3 hot flashes menopausal status was previously on Climara patch #4 history of sebaceous cysts. #5 history of recent left breast cysts - aspirated by ultrasound guidance and noted to be negative for cancer. #6 family history of breast cancer in her sister when she was in her 30s. No genetic testing done at that time.  SURGICAL HISTORY:  #1 breast cysts 2 aspiration on the left side . #2 root canal treatments #3 sebaceous cyst excision   SOCIAL HISTORY: Social History   Socioeconomic History   Marital status: Widowed    Spouse name: Not on file   Number of children: Not on file   Years of education: Not on file   Highest education level: Not on file  Occupational  History   Not on file  Tobacco Use   Smoking status: Never   Smokeless tobacco: Never  Vaping Use   Vaping status: Never Used  Substance and Sexual Activity   Alcohol use: Yes    Alcohol/week: 1.0 standard drink of alcohol    Types: 1 Glasses of wine per week    Comment: occasional   Drug use: No   Sexual activity: Not on file  Other Topics Concern   Not on file  Social History Narrative   Not on file   Social Determinants of Health   Financial Resource Strain: Not on file  Food Insecurity: No Food Insecurity (02/23/2022)   Received from [facility]   Hunger Vital Sign  Transportation Needs: Not on file  Physical Activity: Not on file  Stress: Not on file  Social Connections: Not on file  Intimate Partner Violence: Not on file    FAMILY HISTORY: Family History  Problem Relation Age of Onset   Breast cancer Sister 46   Breast cancer Maternal Aunt        diagnosed in her 60-70s    ALLERGIES:  is allergic to amoxicillin-pot clavulanate, medroxyprogesterone acetate, and codeine.  MEDICATIONS:  Current Outpatient Medications  Medication Sig Dispense Refill   aspirin 81 MG EC tablet Take 81 mg by mouth daily. Swallow whole.     cholecalciferol (VITAMIN D3) 25 MCG (1000 UNIT) tablet Take 1,000 Units by mouth daily.     docusate sodium (COLACE) 100 MG capsule Take 1 capsule (100 mg total) by mouth 2 (  two) times daily. 30 capsule 0   ibuprofen (ADVIL,MOTRIN) 600 MG tablet Take 1 tablet (600 mg total) by mouth every 6 (six) hours as needed. 30 tablet 0   Misc Natural Products (TART CHERRY ADVANCED PO) Take by mouth.     Omega-3 Fatty Acids (FISH OIL) 1200 MG CAPS Take by mouth.     ondansetron (ZOFRAN) 4 MG tablet Take 1 tablet (4 mg total) by mouth every 8 (eight) hours as needed for nausea or vomiting. 20 tablet 0   promethazine (PHENERGAN) 25 MG tablet Take 1 tablet (25 mg total) by mouth every 6 (six) hours as needed for nausea or vomiting. 30 tablet 0   venlafaxine  XR (EFFEXOR-XR) 37.5 MG 24 hr capsule Take 1 capsule (37.5 mg total) by mouth daily with breakfast. 30 capsule 1   vitamin E 100 UNIT capsule Take by mouth daily.     No current facility-administered medications for this visit.    REVIEW OF SYSTEMS:    10 Point review of Systems was done is negative except as noted above.   PHYSICAL EXAMINATION: VSS GENERAL:alert, in no acute distress and comfortable SKIN: no acute rashes, no significant lesions EYES: conjunctiva are pink and non-injected, sclera anicteric OROPHARYNX: MMM, no exudates, no oropharyngeal erythema or ulceration NECK: supple, no JVD LYMPH:  no palpable lymphadenopathy in the cervical, axillary or inguinal regions LUNGS: clear to auscultation b/l with normal respiratory effort HEART: regular rate & rhythm ABDOMEN:  normoactive bowel sounds , non tender, not distended. Extremity: no pedal edema PSYCH: alert & oriented x 3 with fluent speech NEURO: no focal motor/sensory deficits   LABORATORY DATA:  I have reviewed the data as listed  .    Latest Ref Rng & Units 11/06/2022   10:25 AM 10/31/2022    9:22 AM 10/08/2022    9:26 AM  CBC  WBC 4.0 - 10.5 K/uL 4.8  6.3  5.7   Hemoglobin 12.0 - 15.0 g/dL 40.9  81.1  91.4   Hematocrit 36.0 - 46.0 % 39.9  40.7  43.6   Platelets 150 - 400 K/uL 103  87  88    . CBC    Component Value Date/Time   WBC 4.8 11/06/2022 1025   WBC 6.7 03/02/2022 1738   RBC 4.49 11/06/2022 1025   HGB 13.8 11/06/2022 1025   HGB 14.0 03/22/2017 0950   HCT 39.9 11/06/2022 1025   HCT 41.9 03/22/2017 0950   PLT 103 (L) 11/06/2022 1025   PLT 136 (L) 03/22/2017 0950   MCV 88.9 11/06/2022 1025   MCV 93.3 03/22/2017 0950   MCH 30.7 11/06/2022 1025   MCHC 34.6 11/06/2022 1025   RDW 13.5 11/06/2022 1025   RDW 15.0 (H) 03/22/2017 0950   LYMPHSABS 1.6 11/06/2022 1025   LYMPHSABS 1.5 03/22/2017 0950   MONOABS 0.4 11/06/2022 1025   MONOABS 0.3 03/22/2017 0950   EOSABS 0.1 11/06/2022 1025   EOSABS  0.1 03/22/2017 0950   BASOSABS 0.0 11/06/2022 1025   BASOSABS 0.0 03/22/2017 0950    .    Latest Ref Rng & Units 11/06/2022   10:25 AM 10/31/2022    9:22 AM 10/08/2022    9:26 AM  CMP  Glucose 70 - 99 mg/dL 89  94  87   BUN 8 - 23 mg/dL 18  19  15    Creatinine 0.44 - 1.00 mg/dL 7.82  9.56  2.13   Sodium 135 - 145 mmol/L 137  142  143   Potassium  3.5 - 5.1 mmol/L 4.1  3.9  4.2   Chloride 98 - 111 mmol/L 106  109  106   CO2 22 - 32 mmol/L 24  25  30    Calcium 8.9 - 10.3 mg/dL 9.3  9.6  16.1   Total Protein 6.5 - 8.1 g/dL 5.9  6.3  6.2   Total Bilirubin 0.3 - 1.2 mg/dL 1.0  0.5  0.7   Alkaline Phos 38 - 126 U/L 59  66  64   AST 15 - 41 U/L 18  15  16    ALT 0 - 44 U/L 20  13  11     . Lab Results  Component Value Date   LDH 186 11/06/2022        Hematopathology Order10/03/2017 [facility]  BONE MARROW BIOPSY 01/25/2017: Interpretation Fourteen of the twenty metaphase cells analyzed are normal (46,XX). Six cells are abnormal and contain a deletion within the long arm of chromosome 7 and additional material of unknown origin attached to the long arm of chromosome 14 at or near the [facility] locus. Deletion of 7q is seen in mature B-cell neoplasms including marginal zone lymphoma. Ten cells were analyzed from the 24 hour unstimulated culture and ten cells were analyzed from the 72 hour culture stimulated with pokeweed mitogen, phorbol 12-myristate 13-acetate (PMA), and a CpG-oligodeoxynucleotide (ODN). The abnormal clone was only seen in the 72 hour stimulated culture.   Component Name Value Ref Range  Case Report Surgical Pathology Report                         Case: [id number]                                Authorizing Provider:  Vernie Murders,  Collected:           01/25/2017 1100                                    AGNP                                                                        Ordering Location:     [facility]      Received:             01/25/2017 1118                                    [place]                                                                 Pathologist:           Sondra Come,  MD                                                                          Specimens:   A) - Bone Marrow Right - Aspirate                                                                  B) - Bone Marrow Right - Biopsy                                                                    C) - Peripheral Blood                                                                    Final Diagnosis Bone marrow, right iliac, aspiration and biopsy -  Hypercellular bone marrow (70%) with involvement by mature B-cell lymphoma with non-specific immunophenotype (representing approximately 20% of marrow cellularity by immunohistochemical analysis) (See Comment)  -  Cytogenetic studies are pending.     RADIOGRAPHIC STUDIES: I have personally reviewed the radiological images as listed and agreed with the findings in the report. CT CHEST ABDOMEN PELVIS W CONTRAST  Result Date: 10/11/2022 CLINICAL DATA:  Splenic lymphoma, worsening thrombocytopenia and abdominal distention * Tracking Code: BO * EXAM: CT CHEST, ABDOMEN, AND PELVIS WITH CONTRAST TECHNIQUE: Multidetector CT imaging of the chest, abdomen and pelvis was performed following the standard protocol during bolus administration of intravenous contrast. RADIATION DOSE REDUCTION: This exam was performed according to the departmental dose-optimization program which includes automated exposure control, adjustment of the mA and/or kV according to patient size and/or use of iterative reconstruction technique. CONTRAST:  OMNIPAQUE IOHEXOL 300 MG/ML SOLN additional oral enteric contrast COMPARISON:  03/02/2022 FINDINGS: CT CHEST FINDINGS Cardiovascular: Scattered aortic atherosclerosis. Normal heart size. No pericardial  effusion. Mediastinum/Nodes: No enlarged mediastinal, hilar, or axillary lymph nodes. Thyroid gland, trachea, and esophagus demonstrate no significant findings. Lungs/Pleura: Bland, bandlike scarring or atelectasis of the bilateral lung bases. No pleural effusion or pneumothorax. Musculoskeletal: Rounded, masslike lesion of the lateral left breast measuring 4.1 x 2.8 cm (series 2, image 33) and in the lower outer right breast measuring 1.6 x 1.1 cm (series 2, image 40). No acute osseous findings. CT ABDOMEN PELVIS FINDINGS Hepatobiliary: No solid liver abnormality is seen. Multiple small benign hepatic cysts, unchanged, for which no specific further follow-up or characterization is required. No gallstones, gallbladder wall thickening, or biliary dilatation. Pancreas: Unremarkable. No pancreatic ductal dilatation or surrounding inflammatory changes. Spleen: Slightly worsened splenomegaly, maximum  span 15.5 cm, previously 13.7 cm when measured similarly (series 2, image 65). Adrenals/Urinary Tract: Adrenal glands are unremarkable. Kidneys are normal, without renal calculi, solid lesion, or hydronephrosis. Bladder is unremarkable. Stomach/Bowel: Stomach is within normal limits. Appendix appears normal. No evidence of bowel wall thickening, distention, or inflammatory changes. Vascular/Lymphatic: Scattered aortic atherosclerosis. No enlarged abdominal or pelvic lymph nodes. Reproductive: Status post hysterectomy. Other: No abdominal wall hernia or abnormality. No ascites. Musculoskeletal: No acute osseous findings. IMPRESSION: 1. Slightly worsened splenomegaly, maximum span of 15.5 cm, previously 13.7 cm when measured similarly. 2. No lymphadenopathy in the chest, abdomen, or pelvis. 3. Rounded, masslike lesions of the lateral left breast measuring 4.1 x 2.8 cm and in the lower outer right breast measuring 1.6 x 1.1 cm. Correlate with mammographic findings. Aortic Atherosclerosis (ICD10-I70.0). Electronically Signed    By: Jearld Lesch M.D.   On: 10/11/2022 17:37    ASSESSMENT & PLAN:   72 y.o. female with   1) history of splenic marginal zone lymphoma. Presented with splenomegaly and cytopenia with constitutional sypmtoms fatigue and night sweats - now resolved with Rituxan treatment. HIV, hepatitis C and hepatitis B serologies unrevealing  SPEP shows no M spike and negative IFE.  BM Bx done at [facility] -  Hypercellular bone marrow (70%) with involvement by mature B-cell lymphoma with non-specific immunophenotype (representing approximately 20% of marrow cellularity by immunohistochemical analysis) Cytogenetics 7q deletion  2) Hot flashes grade 2  Patient declines consideration of medications for this.  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  Minimal and stable.  4) history of Rituxan related Rash - grade 1. Resolved and did not recur with dose 2 and 3 and 4 with adjusted premedications.  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 Patient notes that her sister had breast cancer in her 30s . Has not had genetic testing . No other family history of breast cancer ovarian cancer pancreatic cancer or melanoma . She has had recent left breast cysts that were aspirated in [place] and per her report were noted to be benign . -yrly MMG with PCP (last done 12/31/2017) - negative   7) history of depression was previously on Lexapro currently not on any medications . Lost her husband 2009 to throat cancer .  PLAN:  -Discussed lab results on 10/31/2022 in detail with patient. CBC showed WBC of 6.3K, hemoglobin normal at 13.8, and platelets mildly low at 87K. -Discussed results of 10/10/2022 CT chest/abdomen/pelvis scan which showed: 1. Slightly worsened splenomegaly, maximum span of 15.5 cm, previously 13.7 cm when measured similarly. 2. No lymphadenopathy in the chest, abdomen, or pelvis. 3. Rounded, masslike lesions of  the lateral left breast measuring 4.1 x 2.8 cm and in the lower outer right breast measuring 1.6 x 1.1 cm. Correlate with mammographic findings. -mammogram in October 2023 showed normal findings -breast lesions may be part of marginal zone lymphoma process -would recommend repeat mammogram, [place] and potentially biopsy for further evaluation -informed patient that if US shows Fibrocystic changes, there would not be a need for aspiration, but if US shows mass-like findings, there may be a role for a core needle biopsy for further evaluation -Will switch Benadryl to IV form of Zyrtec which has slightly lower risk of delayed reactions -Patient reports improvement in abdominal pain related to enlarged spleen with Prednisone use -continue Rituxan treatment to manage the enlarged spleen -Answered all of patient's questions in  detail   FOLLOW-UP: F/u as per currently scheduled treatments and f/u for Rituxan. Mammogram/Breast US in 1-2weeks  The total time spent in the appointment was 30 minutes* .  All of the patient's questions were answered with apparent satisfaction. The patient knows to call the clinic with any problems, questions or concerns.   Wyvonnia Lora MD MS AAHIVMS [facility] Hematology/Oncology [facility]  .*Total Encounter Time as defined by the Centers for Medicare and Medicaid Services includes, in addition to the face-to-face time of a patient visit (documented in the note above) non-face-to-face time: obtaining and reviewing outside history, ordering and reviewing medications, tests or procedures, care coordination (communications with other health care professionals or caregivers) and documentation in the medical record.    I,Mitra Faeizi,acting as a Neurosurgeon for Wyvonnia Lora, MD.,have documented all relevant documentation on the behalf of Wyvonnia Lora, MD,as directed by  Wyvonnia Lora, MD while in the presence of Wyvonnia Lora, MD.  .I have reviewed the above  documentation for accuracy and completeness, and I agree with the above. Johney Maine MD

## 2022-10-31 NOTE — Progress Notes (Signed)
Patient became symptomatic 10 minutes into infusion, SOB, flushing, chest tightness, diaphoretic. Additional 20mg  of pepcid and 60mg  of solumedrol given along with fluids. Patient returned to baseline. MD notified and treatment was restarted. Patient tolerated the rest of her treatment with no issues. See progress note from Symptom management PA 10/31/22 @11 :30 for more information.

## 2022-10-31 NOTE — Progress Notes (Signed)
    DATE:  10/31/22                                        X CHEMO/IMMUNOTHERAPY REACTION           MD: Candise Che   AGENT/BLOOD PRODUCT RECEIVING TODAY:              Ruxience   AGENT/BLOOD PRODUCT RECEIVING IMMEDIATELY PRIOR TO REACTION:          Ruxience   VS: BP:     123/62   P:       66       SPO2:       99% RA                BP:     141/63   P:       69       SPO2:       99% RA     REACTION(S):           chest tightness, diaphoretic, facial flushing, back pain   PREMEDS:     Tylenol 650 mg PO, Pepcid 20 mg IV, Solu-medrol 125 mg IV, Cetirizine 10 mg IV   INTERVENTION: Pepcid 20 mg IV, solu-medrol 60 mg IV   Review of Systems  Review of Systems  Constitutional:  Positive for diaphoresis.  Respiratory:  Positive for chest tightness.   Musculoskeletal:  Positive for back pain.  Skin:  Positive for color change.  All other systems reviewed and are negative.    Physical Exam  Physical Exam Vitals and nursing note reviewed.  Constitutional:      Appearance: She is diaphoretic. She is not toxic-appearing.  HENT:     Head: Normocephalic.  Eyes:     Conjunctiva/sclera: Conjunctivae normal.  Cardiovascular:     Rate and Rhythm: Normal rate and regular rhythm.     Pulses: Normal pulses.     Heart sounds: Normal heart sounds.  Pulmonary:     Effort: Pulmonary effort is normal.     Breath sounds: Normal breath sounds.  Abdominal:     General: There is no distension.  Musculoskeletal:     Cervical back: Normal range of motion.  Skin:    General: Skin is warm.     Comments: Facial flushing  Neurological:     Mental Status: She is alert.     OUTCOME:            Patient became symptomatic 10 minutes into infusion. Emergency medications administered as above and patient returned to baseline. MD notified and treatment was restarted. Patient tolerated remainder of treatment well.

## 2022-10-31 NOTE — Patient Instructions (Signed)
[facility]  Discharge Instructions: Thank you for choosing [facility] to provide your oncology and hematology care.   If you have a lab appointment with the Cancer Center, please go directly to the Cancer Center and check in at the registration area.   Wear comfortable clothing and clothing appropriate for easy access to any Portacath or PICC line.   We strive to give you quality time with your provider. You may need to reschedule your appointment if you arrive late (15 or more minutes).  Arriving late affects you and other patients whose appointments are after yours.  Also, if you miss three or more appointments without notifying the office, you may be dismissed from the clinic at the provider's discretion.      For prescription refill requests, have your pharmacy contact our office and allow 72 hours for refills to be completed.    Today you received the following chemotherapy and/or immunotherapy agents rituxan      To help prevent nausea and vomiting after your treatment, we encourage you to take your nausea medication as directed.  BELOW ARE SYMPTOMS THAT SHOULD BE REPORTED IMMEDIATELY: *FEVER GREATER THAN 100.4 F (38 C) OR HIGHER *CHILLS OR SWEATING *NAUSEA AND VOMITING THAT IS NOT CONTROLLED WITH YOUR NAUSEA MEDICATION *UNUSUAL SHORTNESS OF BREATH *UNUSUAL BRUISING OR BLEEDING *URINARY PROBLEMS (pain or burning when urinating, or frequent urination) *BOWEL PROBLEMS (unusual diarrhea, constipation, pain near the anus) TENDERNESS IN MOUTH AND THROAT WITH OR WITHOUT PRESENCE OF ULCERS (sore throat, sores in mouth, or a toothache) UNUSUAL RASH, SWELLING OR PAIN  UNUSUAL VAGINAL DISCHARGE OR ITCHING   Items with * indicate a potential emergency and should be followed up as soon as possible or go to the Emergency Department if any problems should occur.  Please show the CHEMOTHERAPY ALERT CARD or IMMUNOTHERAPY ALERT CARD at check-in  to the Emergency Department and triage nurse.  Should you have questions after your visit or need to cancel or reschedule your appointment, please contact [facility]  Dept: [phone]  and follow the prompts.  Office hours are 8:00 a.m. to 4:30 p.m. Monday - Friday. Please note that voicemails left after 4:00 p.m. may not be returned until the following business day.  We are closed weekends and major holidays. You have access to a nurse at all times for urgent questions. Please call the main number to the clinic Dept: [phone] and follow the prompts.   For any non-urgent questions, you may also contact your provider using MyChart. We now offer e-Visits for anyone 18 and older to request care online for non-urgent symptoms. For details visit [email or web address].   Also download the MyChart app! Go to the app store, search "MyChart", open the app, select , and log in with your MyChart username and password.  

## 2022-11-01 ENCOUNTER — Encounter: Payer: Self-pay | Admitting: Hematology

## 2022-11-02 ENCOUNTER — Encounter: Payer: Self-pay | Admitting: Hematology

## 2022-11-02 NOTE — Progress Notes (Signed)
Hypersensitivity Reaction note  Date of event: 10/31/2022 Time of event: 11:07 Generic name of drug involved: ruxience  Name of provider notified of the hypersensitivity reaction:  Was agent that likely caused hypersensitivity reaction added to Allergies List within EMR? yes Chain of events including reaction signs/symptoms, treatment administered, and outcome (e.g., drug resumed; drug discontinued; sent to Emergency Department; etc.)   Patient became symptomatic 10 minutes into infusion, SOB, flushing, chest tightness, diaphoretic. Additional 20mg  of pepcid and 60mg  of solumedrol given along with fluids. Patient returned to baseline. MD notified and treatment was restarted. Patient tolerated the rest of her treatment with no issues.  Coralee Rud, RN 11/02/2022 1:05 PM

## 2022-11-05 ENCOUNTER — Encounter: Payer: Self-pay | Admitting: Hematology

## 2022-11-05 ENCOUNTER — Other Ambulatory Visit: Payer: Self-pay | Admitting: Physician Assistant

## 2022-11-05 DIAGNOSIS — C8307 Small cell B-cell lymphoma, spleen: Secondary | ICD-10-CM

## 2022-11-06 ENCOUNTER — Encounter: Payer: Self-pay | Admitting: Hematology

## 2022-11-06 ENCOUNTER — Inpatient Hospital Stay: Payer: Medicare (Managed Care)

## 2022-11-06 ENCOUNTER — Inpatient Hospital Stay (HOSPITAL_BASED_OUTPATIENT_CLINIC_OR_DEPARTMENT_OTHER): Payer: Medicare (Managed Care) | Admitting: Physician Assistant

## 2022-11-06 ENCOUNTER — Other Ambulatory Visit: Payer: Self-pay

## 2022-11-06 VITALS — BP 123/69 | HR 63 | Resp 18

## 2022-11-06 VITALS — BP 125/63 | HR 55 | Temp 98.2°F | Resp 18 | Ht 63.0 in | Wt 133.8 lb

## 2022-11-06 DIAGNOSIS — Z5112 Encounter for antineoplastic immunotherapy: Secondary | ICD-10-CM | POA: Diagnosis not present

## 2022-11-06 DIAGNOSIS — Z7189 Other specified counseling: Secondary | ICD-10-CM

## 2022-11-06 DIAGNOSIS — C8307 Small cell B-cell lymphoma, spleen: Secondary | ICD-10-CM

## 2022-11-06 LAB — CMP (CANCER CENTER ONLY)
ALT: 20 U/L (ref 0–44)
AST: 18 U/L (ref 15–41)
Albumin: 4.1 g/dL (ref 3.5–5.0)
Alkaline Phosphatase: 59 U/L (ref 38–126)
Anion gap: 7 (ref 5–15)
BUN: 18 mg/dL (ref 8–23)
CO2: 24 mmol/L (ref 22–32)
Calcium: 9.3 mg/dL (ref 8.9–10.3)
Chloride: 106 mmol/L (ref 98–111)
Creatinine: 0.7 mg/dL (ref 0.44–1.00)
GFR, Estimated: >60 mL/min (ref >60)
Glucose, Bld: 89 mg/dL (ref 70–99)
Potassium: 4.1 mmol/L (ref 3.5–5.1)
Sodium: 137 mmol/L (ref 135–145)
Total Bilirubin: 1 mg/dL (ref 0.3–1.2)
Total Protein: 5.9 g/dL — ABNORMAL LOW (ref 6.5–8.1)

## 2022-11-06 LAB — CBC WITH DIFFERENTIAL (CANCER CENTER ONLY)
Abs Immature Granulocytes: 0.02 10*3/uL (ref 0.00–0.07)
Basophils Absolute: 0 10*3/uL (ref 0.0–0.1)
Basophils Relative: 0 %
Eosinophils Absolute: 0.1 10*3/uL (ref 0.0–0.5)
Eosinophils Relative: 2 %
HCT: 39.9 % (ref 36.0–46.0)
Hemoglobin: 13.8 g/dL (ref 12.0–15.0)
Immature Granulocytes: 0 %
Lymphocytes Relative: 34 %
Lymphs Abs: 1.6 10*3/uL (ref 0.7–4.0)
MCH: 30.7 pg (ref 26.0–34.0)
MCHC: 34.6 g/dL (ref 30.0–36.0)
MCV: 88.9 fL (ref 80.0–100.0)
Monocytes Absolute: 0.4 10*3/uL (ref 0.1–1.0)
Monocytes Relative: 8 %
Neutro Abs: 2.7 10*3/uL (ref 1.7–7.7)
Neutrophils Relative %: 56 %
Platelet Count: 103 10*3/uL — ABNORMAL LOW (ref 150–400)
RBC: 4.49 MIL/uL (ref 3.87–5.11)
RDW: 13.5 % (ref 11.5–15.5)
WBC Count: 4.8 10*3/uL (ref 4.0–10.5)
nRBC: 0 % (ref 0.0–0.2)

## 2022-11-06 LAB — LACTATE DEHYDROGENASE: LDH: 186 U/L (ref 98–192)

## 2022-11-06 MED ORDER — SODIUM CHLORIDE 0.9 % IV SOLN
375.0000 mg/m2 | Freq: Once | INTRAVENOUS | Status: AC
  Administered 2022-11-06: 600 mg via INTRAVENOUS
  Filled 2022-11-06: qty 50

## 2022-11-06 MED ORDER — FAMOTIDINE IN NACL 20-0.9 MG/50ML-% IV SOLN
20.0000 mg | Freq: Once | INTRAVENOUS | Status: AC
  Administered 2022-11-06: 20 mg via INTRAVENOUS
  Filled 2022-11-06: qty 50

## 2022-11-06 MED ORDER — CETIRIZINE HCL 10 MG/ML IV SOLN
10.0000 mg | Freq: Once | INTRAVENOUS | Status: AC
  Administered 2022-11-06: 10 mg via INTRAVENOUS
  Filled 2022-11-06: qty 1

## 2022-11-06 MED ORDER — METHYLPREDNISOLONE SODIUM SUCC 125 MG IJ SOLR
125.0000 mg | Freq: Once | INTRAMUSCULAR | Status: AC
  Administered 2022-11-06: 125 mg via INTRAVENOUS
  Filled 2022-11-06: qty 2

## 2022-11-06 MED ORDER — SODIUM CHLORIDE 0.9 % IV SOLN: Freq: Once | INTRAVENOUS | Status: AC

## 2022-11-06 MED ORDER — ACETAMINOPHEN 325 MG PO TABS
650.0000 mg | ORAL_TABLET | Freq: Once | ORAL | Status: AC
  Administered 2022-11-06: 650 mg via ORAL
  Filled 2022-11-06: qty 2

## 2022-11-06 NOTE — Progress Notes (Signed)
Per Karena Addison, PA/Dorsey, MD to run Rituximab as a subsequent infusion but to remain at first rate titration for 1hr, dt hx reaction.

## 2022-11-06 NOTE — Patient Instructions (Signed)
[facility]  Discharge Instructions: Thank you for choosing [facility] to provide your oncology and hematology care.   If you have a lab appointment with the Cancer Center, please go directly to the Cancer Center and check in at the registration area.   Wear comfortable clothing and clothing appropriate for easy access to any Portacath or PICC line.   We strive to give you quality time with your provider. You may need to reschedule your appointment if you arrive late (15 or more minutes).  Arriving late affects you and other patients whose appointments are after yours.  Also, if you miss three or more appointments without notifying the office, you may be dismissed from the clinic at the provider's discretion.      For prescription refill requests, have your pharmacy contact our office and allow 72 hours for refills to be completed.    Today you received the following chemotherapy and/or immunotherapy agents Rituxan (Rituximab)   To help prevent nausea and vomiting after your treatment, we encourage you to take your nausea medication as directed.  BELOW ARE SYMPTOMS THAT SHOULD BE REPORTED IMMEDIATELY: *FEVER GREATER THAN 100.4 F (38 C) OR HIGHER *CHILLS OR SWEATING *NAUSEA AND VOMITING THAT IS NOT CONTROLLED WITH YOUR NAUSEA MEDICATION *UNUSUAL SHORTNESS OF BREATH *UNUSUAL BRUISING OR BLEEDING *URINARY PROBLEMS (pain or burning when urinating, or frequent urination) *BOWEL PROBLEMS (unusual diarrhea, constipation, pain near the anus) TENDERNESS IN MOUTH AND THROAT WITH OR WITHOUT PRESENCE OF ULCERS (sore throat, sores in mouth, or a toothache) UNUSUAL RASH, SWELLING OR PAIN  UNUSUAL VAGINAL DISCHARGE OR ITCHING   Items with * indicate a potential emergency and should be followed up as soon as possible or go to the Emergency Department if any problems should occur.  Please show the CHEMOTHERAPY ALERT CARD or IMMUNOTHERAPY ALERT CARD at  check-in to the Emergency Department and triage nurse.  Should you have questions after your visit or need to cancel or reschedule your appointment, please contact [facility]  Dept: [phone]  and follow the prompts.  Office hours are 8:00 a.m. to 4:30 p.m. Monday - Friday. Please note that voicemails left after 4:00 p.m. may not be returned until the following business day.  We are closed weekends and major holidays. You have access to a nurse at all times for urgent questions. Please call the main number to the clinic Dept: [phone] and follow the prompts.   For any non-urgent questions, you may also contact your provider using MyChart. We now offer e-Visits for anyone 61 and older to request care online for non-urgent symptoms. For details visit [email or web address].   Also download the MyChart app! Go to the app store, search "MyChart", open the app, select [place], and log in with your MyChart username and password.  Rituximab Injection What is this medication? RITUXIMAB (ri TUX i mab) treats leukemia and lymphoma. It works by blocking a protein that causes cancer cells to grow and multiply. This helps to slow or stop the spread of cancer cells. It may also be used to treat autoimmune conditions, such as arthritis. It works by slowing down an overactive immune system. It is a monoclonal antibody. This medicine may be used for other purposes; ask your health care provider or pharmacist if you have questions. COMMON BRAND NAME(S): RIABNI, Rituxan, RUXIENCE, truxima What should I tell my care team before I take this medication? They need to know  if you have any of these conditions: Chest pain Heart disease Immune system problems Infection, such as chickenpox, cold sores, hepatitis B, herpes Irregular heartbeat or rhythm Kidney disease Low blood counts, such as low white cells, platelets, red cells Lung disease Recent or upcoming  vaccine An unusual or allergic reaction to rituximab, other medications, foods, dyes, or preservatives Pregnant or trying to get pregnant Breast-feeding How should I use this medication? This medication is injected into a vein. It is given by a care team in a hospital or clinic setting. A special MedGuide will be given to you before each treatment. Be sure to read this information carefully each time. Talk to your care team about the use of this medication in children. While this medication may be prescribed for children as young as 6 months for selected conditions, precautions do apply. Overdosage: If you think you have taken too much of this medicine contact a poison control center or emergency room at once. NOTE: This medicine is only for you. Do not share this medicine with others. What if I miss a dose? Keep appointments for follow-up doses. It is important not to miss your dose. Call your care team if you are unable to keep an appointment. What may interact with this medication? Do not take this medication with any of the following: Live vaccines This medication may also interact with the following: Cisplatin This list may not describe all possible interactions. Give your health care provider a list of all the medicines, herbs, non-prescription drugs, or dietary supplements you use. Also tell them if you smoke, drink alcohol, or use illegal drugs. Some items may interact with your medicine. What should I watch for while using this medication? Your condition will be monitored carefully while you are receiving this medication. You may need blood work while taking this medication. This medication can cause serious infusion reactions. To reduce the risk your care team may give you other medications to take before receiving this one. Be sure to follow the directions from your care team. This medication may increase your risk of getting an infection. Call your care team for advice if you get a  fever, chills, sore throat, or other symptoms of a cold or flu. Do not treat yourself. Try to avoid being around people who are sick. Call your care team if you are around anyone with measles, chickenpox, or if you develop sores or blisters that do not heal properly. Avoid taking medications that contain aspirin, acetaminophen, ibuprofen, naproxen, or ketoprofen unless instructed by your care team. These medications may hide a fever. This medication may cause serious skin reactions. They can happen weeks to months after starting the medication. Contact your care team right away if you notice fevers or flu-like symptoms with a rash. The rash may be red or purple and then turn into blisters or peeling of the skin. You may also notice a red rash with swelling of the face, lips, or lymph nodes in your neck or under your arms. In some patients, this medication may cause a serious brain infection that may cause death. If you have any problems seeing, thinking, speaking, walking, or standing, tell your care team right away. If you cannot reach your care team, urgently seek another source of medical care. Talk to your care team if you may be pregnant. Serious birth defects can occur if you take this medication during pregnancy and for 12 months after the last dose. You will need a negative pregnancy test before  starting this medication. Contraception is recommended while taking this medication and for 12 months after the last dose. Your care team can help you find the option that works for you. Do not breastfeed while taking this medication and for at least 6 months after the last dose. What side effects may I notice from receiving this medication? Side effects that you should report to your care team as soon as possible: Allergic reactions or angioedema--skin rash, itching or hives, swelling of the face, eyes, lips, tongue, arms, or legs, trouble swallowing or breathing Bowel blockage--stomach cramping, unable to  have a bowel movement or pass gas, loss of appetite, vomiting Dizziness, loss of balance or coordination, confusion or trouble speaking Heart attack--pain or tightness in the chest, shoulders, arms, or jaw, nausea, shortness of breath, cold or clammy skin, feeling faint or lightheaded Heart rhythm changes--fast or irregular heartbeat, dizziness, feeling faint or lightheaded, chest pain, trouble breathing Infection--fever, chills, cough, sore throat, wounds that don't heal, pain or trouble when passing urine, general feeling of discomfort or being unwell Infusion reactions--chest pain, shortness of breath or trouble breathing, feeling faint or lightheaded Kidney injury--decrease in the amount of urine, swelling of the ankles, hands, or feet Liver injury--right upper belly pain, loss of appetite, nausea, light-colored stool, dark yellow or brown urine, yellowing skin or eyes, unusual weakness or fatigue Redness, blistering, peeling, or loosening of the skin, including inside the mouth Stomach pain that is severe, does not go away, or gets worse Tumor lysis syndrome (TLS)--nausea, vomiting, diarrhea, decrease in the amount of urine, dark urine, unusual weakness or fatigue, confusion, muscle pain or cramps, fast or irregular heartbeat, joint pain Side effects that usually do not require medical attention (report to your care team if they continue or are bothersome): Headache Joint pain Nausea Runny or stuffy nose Unusual weakness or fatigue This list may not describe all possible side effects. Call your doctor for medical advice about side effects. You may report side effects to FDA at 1-800-FDA-1088. Where should I keep my medication? This medication is given in a hospital or clinic. It will not be stored at home. NOTE: This sheet is a summary. It may not cover all possible information. If you have questions about this medicine, talk to your doctor, pharmacist, or health care provider.  2024  [employer]vier/Gold Standard (2021-08-24 00:00:00)

## 2022-11-08 ENCOUNTER — Encounter: Payer: Self-pay | Admitting: Hematology

## 2022-11-08 NOTE — Progress Notes (Signed)
HEMATOLOGY/ONCOLOGY CLINIC VISIT NOTE  Date of Service: 11/06/22   Patient Care Team: [name], MD as PCP - General (Family Medicine)  CHIEF COMPLAINTS/PURPOSE OF CONSULTATION:  Splenic marginal zone lymphoma  INTERVAL HISTORY:  [name] is a 72 y.o. female here for continued evaluation and management of her splenic marginal zone lymphoma. Patient was last seen by Dr. Candise Che on 10/31/2022  when she started weekly rituximab treatment. She did develop a hypersensitivity reaction with chest tightness, diaphoresis, facial flushing and back pain.  She was treated with emergency medications including Pepcid and Solu-Medrol with relief of symptoms.  She was able to complete the remaining rituximab infusion without any issues.  Today, [name] reports she is feeling well after feeling tired from the infusion last week.  She is able to complete all her daily activities on her own.  Patient experienced diarrhea for 1 day that resolved on its own without any intervention.  She reports shortness of breath with heavy exertion.  She denies fevers, chills, chest pain, shortness of breath, night sweats or cough.   MEDICAL HISTORY:  Past Medical History:  Diagnosis Date   Arthritis    left hand   BRCA gene mutation negative in female    Cancer [facility]) 2018   splenic lymphoma   H/O bladder infections    History of kidney stones    72 years old     SURGICAL HISTORY: Past Surgical History:  Procedure Laterality Date   BREAST CYST ASPIRATION Left    COLONOSCOPY     age 81, 82   LAPAROSCOPIC SALPINGO OOPHERECTOMY Bilateral 10/30/2017   Procedure: LAPAROSCOPIC BILATERAL SALPINGO OOPHORECTOMY, WITH PELVIC WASHINGS;  Surgeon: Myna Hidalgo, DO;  Location: [facility];  Service: Gynecology;  Laterality: Bilateral;      SOCIAL HISTORY: Social History   Socioeconomic History   Marital status: Widowed    Spouse name: Not on file   Number of children: Not on file   Years of education: Not on  file   Highest education level: Not on file  Occupational History   Not on file  Tobacco Use   Smoking status: Never   Smokeless tobacco: Never  Vaping Use   Vaping status: Never Used  Substance and Sexual Activity   Alcohol use: Yes    Alcohol/week: 1.0 standard drink of alcohol    Types: 1 Glasses of wine per week    Comment: occasional   Drug use: No   Sexual activity: Not on file  Other Topics Concern   Not on file  Social History Narrative   Not on file   Social Determinants of Health   Financial Resource Strain: Not on file  Food Insecurity: No Food Insecurity (02/23/2022)   Received from [facility]   Hunger Vital Sign  Transportation Needs: Not on file  Physical Activity: Not on file  Stress: Not on file  Social Connections: Not on file  Intimate Partner Violence: Not on file    FAMILY HISTORY: Family History  Problem Relation Age of Onset   Breast cancer Sister 24   Breast cancer Maternal Aunt        diagnosed in her 60-70s    ALLERGIES:  is allergic to amoxicillin-pot clavulanate, medroxyprogesterone acetate, ruxience [rituximab-pvvr], and codeine.  MEDICATIONS:  Current Outpatient Medications  Medication Sig Dispense Refill   cholecalciferol (VITAMIN D3) 25 MCG (1000 UNIT) tablet Take 1,000 Units by mouth daily.     vitamin E 100 UNIT capsule Take by  mouth daily.     aspirin 81 MG EC tablet Take 81 mg by mouth daily. Swallow whole. (Patient not taking: Reported on 11/06/2022)     docusate sodium (COLACE) 100 MG capsule Take 1 capsule (100 mg total) by mouth 2 (two) times daily. (Patient not taking: Reported on 11/06/2022) 30 capsule 0   ibuprofen (ADVIL,MOTRIN) 600 MG tablet Take 1 tablet (600 mg total) by mouth every 6 (six) hours as needed. (Patient not taking: Reported on 11/06/2022) 30 tablet 0   Misc Natural Products (TART CHERRY ADVANCED PO) Take by mouth. (Patient not taking: Reported on 11/06/2022)     Omega-3 Fatty Acids (FISH OIL) 1200 MG CAPS  Take by mouth. (Patient not taking: Reported on 11/06/2022)     ondansetron (ZOFRAN) 4 MG tablet Take 1 tablet (4 mg total) by mouth every 8 (eight) hours as needed for nausea or vomiting. (Patient not taking: Reported on 11/06/2022) 20 tablet 0   venlafaxine XR (EFFEXOR-XR) 37.5 MG 24 hr capsule Take 1 capsule (37.5 mg total) by mouth daily with breakfast. (Patient not taking: Reported on 11/06/2022) 30 capsule 1   No current facility-administered medications for this visit.    REVIEW OF SYSTEMS:   10 Point review of Systems was done is negative except as noted above.   PHYSICAL EXAMINATION: VSS GENERAL:alert, in no acute distress and comfortable SKIN: no acute rashes, no significant lesions EYES: conjunctiva are pink and non-injected, sclera anicteric OROPHARYNX: MMM, no exudates, no oropharyngeal erythema or ulceration LUNGS: clear to auscultation b/l with normal respiratory effort HEART: regular rate & rhythm Extremity: no pedal edema PSYCH: alert & oriented x 3 with fluent speech NEURO: no focal motor/sensory deficits   LABORATORY DATA:  I have reviewed the data as listed  .    Latest Ref Rng & Units 11/06/2022   10:25 AM 10/31/2022    9:22 AM 10/08/2022    9:26 AM  CBC  WBC 4.0 - 10.5 K/uL 4.8  6.3  5.7   Hemoglobin 12.0 - 15.0 g/dL 84.6  96.2  95.2   Hematocrit 36.0 - 46.0 % 39.9  40.7  43.6   Platelets 150 - 400 K/uL 103  87  88    . CBC    Component Value Date/Time   WBC 4.8 11/06/2022 1025   WBC 6.7 03/02/2022 1738   RBC 4.49 11/06/2022 1025   HGB 13.8 11/06/2022 1025   HGB 14.0 03/22/2017 0950   HCT 39.9 11/06/2022 1025   HCT 41.9 03/22/2017 0950   PLT 103 (L) 11/06/2022 1025   PLT 136 (L) 03/22/2017 0950   MCV 88.9 11/06/2022 1025   MCV 93.3 03/22/2017 0950   MCH 30.7 11/06/2022 1025   MCHC 34.6 11/06/2022 1025   RDW 13.5 11/06/2022 1025   RDW 15.0 (H) 03/22/2017 0950   LYMPHSABS 1.6 11/06/2022 1025   LYMPHSABS 1.5 03/22/2017 0950   MONOABS 0.4  11/06/2022 1025   MONOABS 0.3 03/22/2017 0950   EOSABS 0.1 11/06/2022 1025   EOSABS 0.1 03/22/2017 0950   BASOSABS 0.0 11/06/2022 1025   BASOSABS 0.0 03/22/2017 0950    .    Latest Ref Rng & Units 11/06/2022   10:25 AM 10/31/2022    9:22 AM 10/08/2022    9:26 AM  CMP  Glucose 70 - 99 mg/dL 89  94  87   BUN 8 - 23 mg/dL 18  19  15    Creatinine 0.44 - 1.00 mg/dL 8.41  3.24  4.01   Sodium  135 - 145 mmol/L 137  142  143   Potassium 3.5 - 5.1 mmol/L 4.1  3.9  4.2   Chloride 98 - 111 mmol/L 106  109  106   CO2 22 - 32 mmol/L 24  25  30    Calcium 8.9 - 10.3 mg/dL 9.3  9.6  16.1   Total Protein 6.5 - 8.1 g/dL 5.9  6.3  6.2   Total Bilirubin 0.3 - 1.2 mg/dL 1.0  0.5  0.7   Alkaline Phos 38 - 126 U/L 59  66  64   AST 15 - 41 U/L 18  15  16    ALT 0 - 44 U/L 20  13  11     . Lab Results  Component Value Date   LDH 186 11/06/2022        Hematopathology Order10/03/2017 [facility]  BONE MARROW BIOPSY 01/25/2017: Interpretation Fourteen of the twenty metaphase cells analyzed are normal (46,XX). Six cells are abnormal and contain a deletion within the long arm of chromosome 7 and additional material of unknown origin attached to the long arm of chromosome 14 at or near the [facility] locus. Deletion of 7q is seen in mature B-cell neoplasms including marginal zone lymphoma. Ten cells were analyzed from the 24 hour unstimulated culture and ten cells were analyzed from the 72 hour culture stimulated with pokeweed mitogen, phorbol 12-myristate 13-acetate (PMA), and a CpG-oligodeoxynucleotide (ODN). The abnormal clone was only seen in the 72 hour stimulated culture.   Component Name Value Ref Range  Case Report Surgical Pathology Report                         Case: [id number]                                Authorizing Provider:  Vernie Murders,  Collected:           01/25/2017 1100                                    AGNP                                                                         Ordering Location:     [facility]      Received:            01/25/2017 1118                                    [place]                                                                 Pathologist:           Sondra Come,  MD                                                                          Specimens:   A) - Bone Marrow Right - Aspirate                                                                  B) - Bone Marrow Right - Biopsy                                                                    C) - Peripheral Blood                                                                    Final Diagnosis Bone marrow, right iliac, aspiration and biopsy -  Hypercellular bone marrow (70%) with involvement by mature B-cell lymphoma with non-specific immunophenotype (representing approximately 20% of marrow cellularity by immunohistochemical analysis) (See Comment)  -  Cytogenetic studies are pending.     RADIOGRAPHIC STUDIES: I have personally reviewed the radiological images as listed and agreed with the findings in the report. CT CHEST ABDOMEN PELVIS W CONTRAST  Result Date: 10/11/2022 CLINICAL DATA:  Splenic lymphoma, worsening thrombocytopenia and abdominal distention * Tracking Code: BO * EXAM: CT CHEST, ABDOMEN, AND PELVIS WITH CONTRAST TECHNIQUE: Multidetector CT imaging of the chest, abdomen and pelvis was performed following the standard protocol during bolus administration of intravenous contrast. RADIATION DOSE REDUCTION: This exam was performed according to the departmental dose-optimization program which includes automated exposure control, adjustment of the mA and/or kV according to patient size and/or use of iterative reconstruction technique. CONTRAST:  OMNIPAQUE IOHEXOL 300 MG/ML SOLN additional oral enteric contrast COMPARISON:  03/02/2022 FINDINGS: CT CHEST FINDINGS  Cardiovascular: Scattered aortic atherosclerosis. Normal heart size. No pericardial effusion. Mediastinum/Nodes: No enlarged mediastinal, hilar, or axillary lymph nodes. Thyroid gland, trachea, and esophagus demonstrate no significant findings. Lungs/Pleura: Bland, bandlike scarring or atelectasis of the bilateral lung bases. No pleural effusion or pneumothorax. Musculoskeletal: Rounded, masslike lesion of the lateral left breast measuring 4.1 x 2.8 cm (series 2, image 33) and in the lower outer right breast measuring 1.6 x 1.1 cm (series 2, image 40). No acute osseous findings. CT ABDOMEN PELVIS FINDINGS Hepatobiliary: No solid liver abnormality is seen. Multiple small benign hepatic cysts, unchanged, for which no specific further follow-up or characterization is required. No gallstones, gallbladder wall thickening, or biliary dilatation. Pancreas: Unremarkable. No pancreatic ductal dilatation or surrounding inflammatory changes. Spleen: Slightly worsened splenomegaly, maximum  span 15.5 cm, previously 13.7 cm when measured similarly (series 2, image 65). Adrenals/Urinary Tract: Adrenal glands are unremarkable. Kidneys are normal, without renal calculi, solid lesion, or hydronephrosis. Bladder is unremarkable. Stomach/Bowel: Stomach is within normal limits. Appendix appears normal. No evidence of bowel wall thickening, distention, or inflammatory changes. Vascular/Lymphatic: Scattered aortic atherosclerosis. No enlarged abdominal or pelvic lymph nodes. Reproductive: Status post hysterectomy. Other: No abdominal wall hernia or abnormality. No ascites. Musculoskeletal: No acute osseous findings. IMPRESSION: 1. Slightly worsened splenomegaly, maximum span of 15.5 cm, previously 13.7 cm when measured similarly. 2. No lymphadenopathy in the chest, abdomen, or pelvis. 3. Rounded, masslike lesions of the lateral left breast measuring 4.1 x 2.8 cm and in the lower outer right breast measuring 1.6 x 1.1 cm. Correlate with  mammographic findings. Aortic Atherosclerosis (ICD10-I70.0). Electronically Signed   By: Jearld Lesch M.D.   On: 10/11/2022 17:37    ASSESSMENT & PLAN:   72 y.o. female with   1)Splenic marginal zone lymphoma. -Presented with splenomegaly and cytopenia with constitutional sypmtoms fatigue and night sweats - now resolved with Rituxan treatment. -HIV, hepatitis C and hepatitis B serologies unrevealing  -SPEP shows no M spike and negative IFE.  BM Bx done at [facility] -  Hypercellular bone marrow (70%) with involvement by mature B-cell lymphoma with non-specific immunophenotype (representing approximately 20% of marrow cellularity by immunohistochemical analysis) Cytogenetics 7q deletion  2) Hot flashes -Grade 2 -  Patient declines consideration of medications for this.  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  Minimal and stable.  4) History of Rituxan related Rash  -Grade 1. Resolved and did not recur with dose 2 and 3 and 4 with adjusted premedications.  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  -Patient notes that her sister had breast cancer in her 30s . Has not had genetic testing . No other family history of breast cancer ovarian cancer pancreatic cancer or melanoma . -She has had recent left breast cysts that were aspirated and per her report were noted to be benign . -Has yearly MMG with PCP (last done 12/31/2017) - negative  -Most recent CT CAP from 10/10/22 showed rounded, masslike lesions of the lateral left breast measuring 4.1 x 2.8 cm and in the lower outer right breast measuring 1.6 x 1.1 cm. Plan to obtain mammogram and [place] for further evaluation.   7) history of depression was previously on Lexapro currently not on any medications . Lost her husband 2009 to throat cancer .  PLAN: -Due for Cycle 2 of weekly Ritxumab today -Labs from today were reviewed and  adequate for treatment. WBC 4.8, Hgb 13.8, Plt improved to 103K. Creatinine and LFTs normal.  -Due to infusion reaction with Cycle 1, we will increase the first rate titration for one hour to minimize subsequent reactions.  -Proceed with treatment today without any dose modifications   FOLLOW-UP: -RTC in 1 week with labs and Cycle 3 of Rituximab -RTC in 2 weeks with labs, follow up with Dr. Candise Che before Cycle 4 of Rituximab -Mammogram/Breast US scheduled for 11/29/2022  All of the patient's questions were answered with apparent satisfaction. The patient knows to call the clinic with any problems, questions or concerns.  I have spent a total of 30 minutes minutes of face-to-face and non-face-to-face time, preparing to see the patient, performing a medically appropriate examination, counseling and educating the patient,  documenting clinical  information in the electronic health record, and care coordination.   Georga Kaufmann PA-C Dept of Hematology and Oncology [facility] Phone: [phone]

## 2022-11-12 ENCOUNTER — Other Ambulatory Visit: Payer: Self-pay

## 2022-11-12 DIAGNOSIS — C8307 Small cell B-cell lymphoma, spleen: Secondary | ICD-10-CM

## 2022-11-13 ENCOUNTER — Encounter: Payer: Self-pay | Admitting: Hematology

## 2022-11-13 ENCOUNTER — Inpatient Hospital Stay: Payer: Medicare (Managed Care)

## 2022-11-13 VITALS — BP 122/61 | HR 68 | Temp 98.3°F | Resp 16 | Ht 63.0 in | Wt 135.5 lb

## 2022-11-13 DIAGNOSIS — C8307 Small cell B-cell lymphoma, spleen: Secondary | ICD-10-CM

## 2022-11-13 DIAGNOSIS — Z7189 Other specified counseling: Secondary | ICD-10-CM

## 2022-11-13 DIAGNOSIS — Z5112 Encounter for antineoplastic immunotherapy: Secondary | ICD-10-CM | POA: Diagnosis not present

## 2022-11-13 LAB — CBC WITH DIFFERENTIAL (CANCER CENTER ONLY)
Abs Immature Granulocytes: 0.03 10*3/uL (ref 0.00–0.07)
Basophils Absolute: 0 10*3/uL (ref 0.0–0.1)
Basophils Relative: 0 %
Eosinophils Absolute: 0.1 10*3/uL (ref 0.0–0.5)
Eosinophils Relative: 2 %
HCT: 41.2 % (ref 36.0–46.0)
Hemoglobin: 14.1 g/dL (ref 12.0–15.0)
Immature Granulocytes: 1 %
Lymphocytes Relative: 28 %
Lymphs Abs: 1.8 10*3/uL (ref 0.7–4.0)
MCH: 31.1 pg (ref 26.0–34.0)
MCHC: 34.2 g/dL (ref 30.0–36.0)
MCV: 90.7 fL (ref 80.0–100.0)
Monocytes Absolute: 0.3 10*3/uL (ref 0.1–1.0)
Monocytes Relative: 5 %
Neutro Abs: 4.3 10*3/uL (ref 1.7–7.7)
Neutrophils Relative %: 64 %
Platelet Count: 164 10*3/uL (ref 150–400)
RBC: 4.54 MIL/uL (ref 3.87–5.11)
RDW: 14 % (ref 11.5–15.5)
WBC Count: 6.6 10*3/uL (ref 4.0–10.5)
nRBC: 0 % (ref 0.0–0.2)

## 2022-11-13 LAB — CMP (CANCER CENTER ONLY)
ALT: 17 U/L (ref 0–44)
AST: 17 U/L (ref 15–41)
Albumin: 4.3 g/dL (ref 3.5–5.0)
Alkaline Phosphatase: 61 U/L (ref 38–126)
Anion gap: 9 (ref 5–15)
BUN: 17 mg/dL (ref 8–23)
CO2: 23 mmol/L (ref 22–32)
Calcium: 8.9 mg/dL (ref 8.9–10.3)
Chloride: 107 mmol/L (ref 98–111)
Creatinine: 0.69 mg/dL (ref 0.44–1.00)
GFR, Estimated: >60 mL/min (ref >60)
Glucose, Bld: 72 mg/dL (ref 70–99)
Potassium: 3.6 mmol/L (ref 3.5–5.1)
Sodium: 139 mmol/L (ref 135–145)
Total Bilirubin: 0.9 mg/dL (ref 0.3–1.2)
Total Protein: 6.4 g/dL — ABNORMAL LOW (ref 6.5–8.1)

## 2022-11-13 LAB — LACTATE DEHYDROGENASE: LDH: 191 U/L (ref 98–192)

## 2022-11-13 MED ORDER — ACETAMINOPHEN 325 MG PO TABS
650.0000 mg | ORAL_TABLET | Freq: Once | ORAL | Status: AC
  Administered 2022-11-13: 650 mg via ORAL
  Filled 2022-11-13: qty 2

## 2022-11-13 MED ORDER — SODIUM CHLORIDE 0.9 % IV SOLN
375.0000 mg/m2 | Freq: Once | INTRAVENOUS | Status: AC
  Administered 2022-11-13: 600 mg via INTRAVENOUS
  Filled 2022-11-13: qty 50

## 2022-11-13 MED ORDER — CETIRIZINE HCL 10 MG/ML IV SOLN
10.0000 mg | Freq: Once | INTRAVENOUS | Status: AC
  Administered 2022-11-13: 10 mg via INTRAVENOUS
  Filled 2022-11-13: qty 1

## 2022-11-13 MED ORDER — METHYLPREDNISOLONE SODIUM SUCC 125 MG IJ SOLR
125.0000 mg | Freq: Once | INTRAMUSCULAR | Status: AC
  Administered 2022-11-13: 125 mg via INTRAVENOUS
  Filled 2022-11-13: qty 2

## 2022-11-13 MED ORDER — SODIUM CHLORIDE 0.9 % IV SOLN: Freq: Once | INTRAVENOUS | Status: AC

## 2022-11-13 MED ORDER — FAMOTIDINE IN NACL 20-0.9 MG/50ML-% IV SOLN
20.0000 mg | Freq: Once | INTRAVENOUS | Status: AC
  Administered 2022-11-13: 20 mg via INTRAVENOUS
  Filled 2022-11-13: qty 50

## 2022-11-13 NOTE — Patient Instructions (Signed)
[place] [facility]   Discharge Instructions: Thank you for choosing [facility] to provide your oncology and hematology care.   If you have a lab appointment with the Cancer Center, please go directly to the Cancer Center and check in at the registration area.   Wear comfortable clothing and clothing appropriate for easy access to any Portacath or PICC line.   We strive to give you quality time with your provider. You may need to reschedule your appointment if you arrive late (15 or more minutes).  Arriving late affects you and other patients whose appointments are after yours.  Also, if you miss three or more appointments without notifying the office, you may be dismissed from the clinic at the provider's discretion.      For prescription refill requests, have your pharmacy contact our office and allow 72 hours for refills to be completed.    Today you received the following chemotherapy and/or immunotherapy agents: Rituximab (Rituxan)      To help prevent nausea and vomiting after your treatment, we encourage you to take your nausea medication as directed.  BELOW ARE SYMPTOMS THAT SHOULD BE REPORTED IMMEDIATELY: *FEVER GREATER THAN 100.4 F (38 C) OR HIGHER *CHILLS OR SWEATING *NAUSEA AND VOMITING THAT IS NOT CONTROLLED WITH YOUR NAUSEA MEDICATION *UNUSUAL SHORTNESS OF BREATH *UNUSUAL BRUISING OR BLEEDING *URINARY PROBLEMS (pain or burning when urinating, or frequent urination) *BOWEL PROBLEMS (unusual diarrhea, constipation, pain near the anus) TENDERNESS IN MOUTH AND THROAT WITH OR WITHOUT PRESENCE OF ULCERS (sore throat, sores in mouth, or a toothache) UNUSUAL RASH, SWELLING OR PAIN  UNUSUAL VAGINAL DISCHARGE OR ITCHING   Items with * indicate a potential emergency and should be followed up as soon as possible or go to the Emergency Department if any problems should occur.  Please show the CHEMOTHERAPY ALERT CARD or IMMUNOTHERAPY ALERT  CARD at check-in to the Emergency Department and triage nurse.  Should you have questions after your visit or need to cancel or reschedule your appointment, please contact [facility]  Dept: [phone]  and follow the prompts.  Office hours are 8:00 a.m. to 4:30 p.m. Monday - Friday. Please note that voicemails left after 4:00 p.m. may not be returned until the following business day.  We are closed weekends and major holidays. You have access to a nurse at all times for urgent questions. Please call the main number to the clinic Dept: [phone] and follow the prompts.   For any non-urgent questions, you may also contact your provider using MyChart. We now offer e-Visits for anyone 25 and older to request care online for non-urgent symptoms. For details visit [email or web address].   Also download the MyChart app! Go to the app store, search "MyChart", open the app, select , and log in with your MyChart username and password.

## 2022-11-22 ENCOUNTER — Other Ambulatory Visit: Payer: Self-pay

## 2022-11-22 DIAGNOSIS — C8307 Small cell B-cell lymphoma, spleen: Secondary | ICD-10-CM

## 2022-11-23 ENCOUNTER — Inpatient Hospital Stay (HOSPITAL_BASED_OUTPATIENT_CLINIC_OR_DEPARTMENT_OTHER): Payer: Medicare (Managed Care) | Admitting: Hematology

## 2022-11-23 ENCOUNTER — Inpatient Hospital Stay: Payer: Medicare (Managed Care)

## 2022-11-23 ENCOUNTER — Other Ambulatory Visit: Payer: Self-pay

## 2022-11-23 ENCOUNTER — Inpatient Hospital Stay: Payer: Medicare (Managed Care) | Attending: Hematology

## 2022-11-23 VITALS — BP 140/65 | HR 68 | Resp 16

## 2022-11-23 VITALS — BP 126/58 | HR 53 | Temp 97.6°F | Resp 17 | Wt 134.4 lb

## 2022-11-23 DIAGNOSIS — C8307 Small cell B-cell lymphoma, spleen: Secondary | ICD-10-CM | POA: Diagnosis not present

## 2022-11-23 DIAGNOSIS — Z7189 Other specified counseling: Secondary | ICD-10-CM | POA: Diagnosis not present

## 2022-11-23 DIAGNOSIS — Z79620 Long term (current) use of immunosuppressive biologic: Secondary | ICD-10-CM | POA: Diagnosis not present

## 2022-11-23 DIAGNOSIS — Z5112 Encounter for antineoplastic immunotherapy: Secondary | ICD-10-CM | POA: Diagnosis not present

## 2022-11-23 LAB — CBC WITH DIFFERENTIAL (CANCER CENTER ONLY)
Abs Immature Granulocytes: 0.01 10*3/uL (ref 0.00–0.07)
Basophils Absolute: 0 10*3/uL (ref 0.0–0.1)
Basophils Relative: 1 %
Eosinophils Absolute: 0.1 10*3/uL (ref 0.0–0.5)
Eosinophils Relative: 1 %
HCT: 39.7 % (ref 36.0–46.0)
Hemoglobin: 13.7 g/dL (ref 12.0–15.0)
Immature Granulocytes: 0 %
Lymphocytes Relative: 37 %
Lymphs Abs: 1.6 10*3/uL (ref 0.7–4.0)
MCH: 30.9 pg (ref 26.0–34.0)
MCHC: 34.5 g/dL (ref 30.0–36.0)
MCV: 89.4 fL (ref 80.0–100.0)
Monocytes Absolute: 0.4 10*3/uL (ref 0.1–1.0)
Monocytes Relative: 9 %
Neutro Abs: 2.3 10*3/uL (ref 1.7–7.7)
Neutrophils Relative %: 52 %
Platelet Count: 138 10*3/uL — ABNORMAL LOW (ref 150–400)
RBC: 4.44 MIL/uL (ref 3.87–5.11)
RDW: 14.4 % (ref 11.5–15.5)
WBC Count: 4.4 10*3/uL (ref 4.0–10.5)
nRBC: 0 % (ref 0.0–0.2)

## 2022-11-23 LAB — CMP (CANCER CENTER ONLY)
ALT: 13 U/L (ref 0–44)
AST: 14 U/L — ABNORMAL LOW (ref 15–41)
Albumin: 4.3 g/dL (ref 3.5–5.0)
Alkaline Phosphatase: 58 U/L (ref 38–126)
Anion gap: 8 (ref 5–15)
BUN: 19 mg/dL (ref 8–23)
CO2: 24 mmol/L (ref 22–32)
Calcium: 9.1 mg/dL (ref 8.9–10.3)
Chloride: 107 mmol/L (ref 98–111)
Creatinine: 0.75 mg/dL (ref 0.44–1.00)
GFR, Estimated: >60 mL/min (ref >60)
Glucose, Bld: 95 mg/dL (ref 70–99)
Potassium: 4.1 mmol/L (ref 3.5–5.1)
Sodium: 139 mmol/L (ref 135–145)
Total Bilirubin: 1.1 mg/dL (ref 0.3–1.2)
Total Protein: 6.3 g/dL — ABNORMAL LOW (ref 6.5–8.1)

## 2022-11-23 LAB — LACTATE DEHYDROGENASE: LDH: 172 U/L (ref 98–192)

## 2022-11-23 MED ORDER — ACETAMINOPHEN 325 MG PO TABS
650.0000 mg | ORAL_TABLET | Freq: Once | ORAL | Status: AC
  Administered 2022-11-23: 650 mg via ORAL
  Filled 2022-11-23: qty 2

## 2022-11-23 MED ORDER — SODIUM CHLORIDE 0.9 % IV SOLN
375.0000 mg/m2 | Freq: Once | INTRAVENOUS | Status: AC
  Administered 2022-11-23: 600 mg via INTRAVENOUS
  Filled 2022-11-23: qty 50

## 2022-11-23 MED ORDER — METHYLPREDNISOLONE SODIUM SUCC 125 MG IJ SOLR
125.0000 mg | Freq: Once | INTRAMUSCULAR | Status: AC
  Administered 2022-11-23: 125 mg via INTRAVENOUS
  Filled 2022-11-23: qty 2

## 2022-11-23 MED ORDER — CETIRIZINE HCL 10 MG/ML IV SOLN
10.0000 mg | Freq: Once | INTRAVENOUS | Status: AC
  Administered 2022-11-23: 10 mg via INTRAVENOUS
  Filled 2022-11-23: qty 1

## 2022-11-23 MED ORDER — SODIUM CHLORIDE 0.9 % IV SOLN: Freq: Once | INTRAVENOUS | Status: AC

## 2022-11-23 MED ORDER — FAMOTIDINE IN NACL 20-0.9 MG/50ML-% IV SOLN
20.0000 mg | Freq: Once | INTRAVENOUS | Status: AC
  Administered 2022-11-23: 20 mg via INTRAVENOUS
  Filled 2022-11-23: qty 50

## 2022-11-23 NOTE — Progress Notes (Signed)
Patient seen by Dr. Kale  Vitals are within treatment parameters.  Labs reviewed: and are within treatment parameters.  Per physician team, patient is ready for treatment and there are NO modifications to the treatment plan.  

## 2022-11-23 NOTE — Patient Instructions (Signed)
[place]  Discharge Instructions: Thank you for choosing [facility] to provide your oncology and hematology care.   If you have a lab appointment with the [facility], please go directly to the [facility] and check in at the registration area.   Wear comfortable clothing and clothing appropriate for easy access to any Portacath or PICC line.   We strive to give you quality time with your provider. You may need to reschedule your appointment if you arrive late (15 or more minutes).  Arriving late affects you and other patients whose appointments are after yours.  Also, if you miss three or more appointments without notifying the office, you may be dismissed from the clinic at the provider's discretion.      For prescription refill requests, have your pharmacy contact our office and allow 72 hours for refills to be completed.    Today you received the following chemotherapy and/or immunotherapy agents: Rituximab.       To help prevent nausea and vomiting after your treatment, we encourage you to take your nausea medication as directed.  BELOW ARE SYMPTOMS THAT SHOULD BE REPORTED IMMEDIATELY: *FEVER GREATER THAN 100.4 F (38 C) OR HIGHER *CHILLS OR SWEATING *NAUSEA AND VOMITING THAT IS NOT CONTROLLED WITH YOUR NAUSEA MEDICATION *UNUSUAL SHORTNESS OF BREATH *UNUSUAL BRUISING OR BLEEDING *URINARY PROBLEMS (pain or burning when urinating, or frequent urination) *BOWEL PROBLEMS (unusual diarrhea, constipation, pain near the anus) TENDERNESS IN MOUTH AND THROAT WITH OR WITHOUT PRESENCE OF ULCERS (sore throat, sores in mouth, or a toothache) UNUSUAL RASH, SWELLING OR PAIN  UNUSUAL VAGINAL DISCHARGE OR ITCHING   Items with * indicate a potential emergency and should be followed up as soon as possible or go to the Emergency Department if any problems should occur.  Please show the CHEMOTHERAPY ALERT CARD or IMMUNOTHERAPY ALERT CARD at  check-in to the Emergency Department and triage nurse.  Should you have questions after your visit or need to cancel or reschedule your appointment, please contact [place]  Dept: [phone]  and follow the prompts.  Office hours are 8:00 a.m. to 4:30 p.m. Monday - Friday. Please note that voicemails left after 4:00 p.m. may not be returned until the following business day.  We are closed weekends and major holidays. You have access to a nurse at all times for urgent questions. Please call the main number to the clinic Dept: [phone] and follow the prompts.   For any non-urgent questions, you may also contact your provider using MyChart. We now offer e-Visits for anyone 20 and older to request care online for non-urgent symptoms. For details visit [email or web address].   Also download the MyChart app! Go to the app store, search "MyChart", open the app, select [place], and log in with your MyChart username and password.

## 2022-11-23 NOTE — Progress Notes (Signed)
HEMATOLOGY/ONCOLOGY CLINIC VISIT NOTE  Date of Service: 11/23/22   Patient Care Team: [name], MD as PCP - General (Family Medicine)  CHIEF COMPLAINTS/PURPOSE OF CONSULTATION:   F/u for splenic marginal zone lymphoma  HISTORY OF PRESENTING ILLNESS:   Please see previous notes for details on initial presentation  INTERVAL HISTORY:  [name] is a 72 y.o. female here for continued evaluation and management of her splenic marginal zone lymphoma. Patient was last seen by me on 10/31/2022 and complained of left lower abdominal pain, increased fatigue, and worsened left breast discomfort.  She experienced a reaction to Ruxience infusion with facial flushing, chest tightness, diaphoresis, and back pain. She was given  Pepcid 20 mg IV, solu-medrol 60 mg IV as an intervention which resolved symptoms. Patient was able to complete remaining treatment with no other issues.   Patient was seen by [facility] on 11/06/2022 and reported fatigue, an episode of diarrhea, and SOB with heavy exertion.   Today, she is here for toxicity check for cycle 4 day 1 of Rituxan treatment. She reports that her breast discomfort has been stable. Patient complains of fatigue which has improved from previously. Her abdominal pain has also improved. She has been tolerating Rituxan well with no major toxicity issues. Patient denies any back pain, leg swelling, or abdominal pain. She regularly consumes plenty of fruits and vegetables in her diet.   MEDICAL HISTORY:   #1 Dyslipidemia - not on any medications #2 GERD #3 hot flashes menopausal status was previously on Climara patch #4 history of sebaceous cysts. #5 history of recent left breast cysts - aspirated by ultrasound guidance and noted to be negative for cancer. #6 family history of breast cancer in her sister when she was in her 30s. No genetic testing done at that time.  SURGICAL HISTORY:  #1 breast cysts 2 aspiration on the left side . #2 root  canal treatments #3 sebaceous cyst excision   SOCIAL HISTORY: Social History   Socioeconomic History   Marital status: Widowed    Spouse name: Not on file   Number of children: Not on file   Years of education: Not on file   Highest education level: Not on file  Occupational History   Not on file  Tobacco Use   Smoking status: Never   Smokeless tobacco: Never  Vaping Use   Vaping status: Never Used  Substance and Sexual Activity   Alcohol use: Yes    Alcohol/week: 1.0 standard drink of alcohol    Types: 1 Glasses of wine per week    Comment: occasional   Drug use: No   Sexual activity: Not on file  Other Topics Concern   Not on file  Social History Narrative   Not on file   Social Determinants of Health   Financial Resource Strain: Not on file  Food Insecurity: No Food Insecurity (02/23/2022)   Received from [facility]   Hunger Vital Sign  Transportation Needs: Not on file  Physical Activity: Not on file  Stress: Not on file  Social Connections: Not on file  Intimate Partner Violence: Not on file    FAMILY HISTORY: Family History  Problem Relation Age of Onset   Breast cancer Sister 41   Breast cancer Maternal Aunt        diagnosed in her 60-70s    ALLERGIES:  is allergic to amoxicillin-pot clavulanate, medroxyprogesterone acetate, ruxience [rituximab-pvvr], and codeine.  MEDICATIONS:  Current Outpatient Medications  Medication Sig Dispense  Refill   cholecalciferol (VITAMIN D3) 25 MCG (1000 UNIT) tablet Take 1,000 Units by mouth daily.     ondansetron (ZOFRAN) 4 MG tablet Take 1 tablet (4 mg total) by mouth every 8 (eight) hours as needed for nausea or vomiting. (Patient not taking: Reported on 11/06/2022) 20 tablet 0   vitamin E 100 UNIT capsule Take by mouth daily.     No current facility-administered medications for this visit.    REVIEW OF SYSTEMS:    10 Point review of Systems was done is negative except as noted above.   PHYSICAL  EXAMINATION: .BP (!) 126/58 (BP Location: Right Arm, Patient Position: Sitting) Comment: Notified Nurse  Pulse (!) 53 Comment: Notified Nurse  Temp 97.6 F (36.4 C) (Oral)   Resp 17   Wt 134 lb 6.4 oz (61 kg)   SpO2 100%   BMI 23.81 kg/m   GENERAL:alert, in no acute distress and comfortable SKIN: no acute rashes, no significant lesions EYES: conjunctiva are pink and non-injected, sclera anicteric OROPHARYNX: MMM, no exudates, no oropharyngeal erythema or ulceration NECK: supple, no JVD LYMPH:  no palpable lymphadenopathy in the cervical, axillary or inguinal regions LUNGS: clear to auscultation b/l with normal respiratory effort HEART: regular rate & rhythm ABDOMEN:  normoactive bowel sounds , non tender, not distended. Extremity: no pedal edema PSYCH: alert & oriented x 3 with fluent speech NEURO: no focal motor/sensory deficits   LABORATORY DATA:  I have reviewed the data as listed  .    Latest Ref Rng & Units 11/23/2022   10:20 AM 11/13/2022   12:30 PM 11/06/2022   10:25 AM  CBC  WBC 4.0 - 10.5 K/uL 4.4  6.6  4.8   Hemoglobin 12.0 - 15.0 g/dL 16.1  09.6  04.5   Hematocrit 36.0 - 46.0 % 39.7  41.2  39.9   Platelets 150 - 400 K/uL 138  164  103    . CBC    Component Value Date/Time   WBC 4.4 11/23/2022 1020   WBC 6.7 03/02/2022 1738   RBC 4.44 11/23/2022 1020   HGB 13.7 11/23/2022 1020   HGB 14.0 03/22/2017 0950   HCT 39.7 11/23/2022 1020   HCT 41.9 03/22/2017 0950   PLT 138 (L) 11/23/2022 1020   PLT 136 (L) 03/22/2017 0950   MCV 89.4 11/23/2022 1020   MCV 93.3 03/22/2017 0950   MCH 30.9 11/23/2022 1020   MCHC 34.5 11/23/2022 1020   RDW 14.4 11/23/2022 1020   RDW 15.0 (H) 03/22/2017 0950   LYMPHSABS 1.6 11/23/2022 1020   LYMPHSABS 1.5 03/22/2017 0950   MONOABS 0.4 11/23/2022 1020   MONOABS 0.3 03/22/2017 0950   EOSABS 0.1 11/23/2022 1020   EOSABS 0.1 03/22/2017 0950   BASOSABS 0.0 11/23/2022 1020   BASOSABS 0.0 03/22/2017 0950    .    Latest Ref Rng  & Units 11/23/2022   10:20 AM 11/13/2022   12:30 PM 11/06/2022   10:25 AM  CMP  Glucose 70 - 99 mg/dL 95  72  89   BUN 8 - 23 mg/dL 19  17  18    Creatinine 0.44 - 1.00 mg/dL 4.09  8.11  9.14   Sodium 135 - 145 mmol/L 139  139  137   Potassium 3.5 - 5.1 mmol/L 4.1  3.6  4.1   Chloride 98 - 111 mmol/L 107  107  106   CO2 22 - 32 mmol/L 24  23  24    Calcium 8.9 - 10.3 mg/dL  9.1  8.9  9.3   Total Protein 6.5 - 8.1 g/dL 6.3  6.4  5.9   Total Bilirubin 0.3 - 1.2 mg/dL 1.1  0.9  1.0   Alkaline Phos 38 - 126 U/L 58  61  59   AST 15 - 41 U/L 14  17  18    ALT 0 - 44 U/L 13  17  20     . Lab Results  Component Value Date   LDH 172 11/23/2022        Hematopathology Order10/03/2017 [facility]  BONE MARROW BIOPSY 01/25/2017: Interpretation Fourteen of the twenty metaphase cells analyzed are normal (46,XX). Six cells are abnormal and contain a deletion within the long arm of chromosome 7 and additional material of unknown origin attached to the long arm of chromosome 14 at or near the [facility] locus. Deletion of 7q is seen in mature B-cell neoplasms including marginal zone lymphoma. Ten cells were analyzed from the 24 hour unstimulated culture and ten cells were analyzed from the 72 hour culture stimulated with pokeweed mitogen, phorbol 12-myristate 13-acetate (PMA), and a CpG-oligodeoxynucleotide (ODN). The abnormal clone was only seen in the 72 hour stimulated culture.   Component Name Value Ref Range  Case Report Surgical Pathology Report                         Case: [id number]                                Authorizing Provider:  Vernie Murders,  Collected:           01/25/2017 1100                                    AGNP                                                                        Ordering Location:     [facility]      Received:            01/25/2017 1118                                    [place]                                                                  Pathologist:           Sondra Come,  MD                                                                          Specimens:   A) - Bone Marrow Right - Aspirate                                                                  B) - Bone Marrow Right - Biopsy                                                                    C) - Peripheral Blood                                                                    Final Diagnosis Bone marrow, right iliac, aspiration and biopsy -  Hypercellular bone marrow (70%) with involvement by mature B-cell lymphoma with non-specific immunophenotype (representing approximately 20% of marrow cellularity by immunohistochemical analysis) (See Comment)  -  Cytogenetic studies are pending.     RADIOGRAPHIC STUDIES: I have personally reviewed the radiological images as listed and agreed with the findings in the report. No results found.  ASSESSMENT & PLAN:   72 y.o. female with   1) history of splenic marginal zone lymphoma. Presented with splenomegaly and cytopenia with constitutional sypmtoms fatigue and night sweats - now resolved with Rituxan treatment. HIV, hepatitis C and hepatitis B serologies unrevealing  SPEP shows no M spike and negative IFE.  BM Bx done at [facility] -  Hypercellular bone marrow (70%) with involvement by mature B-cell lymphoma with non-specific immunophenotype (representing approximately 20% of marrow cellularity by immunohistochemical analysis) Cytogenetics 7q deletion  2) Hot flashes grade 2  Patient declines consideration of medications for this.  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  Minimal and stable.  4) history of Rituxan related Rash - grade 1. Resolved and did not recur with dose 2 and 3 and 4 with adjusted premedications.  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 Patient notes that her sister had breast cancer in her 30s . Has not had genetic testing . No other family history of breast cancer ovarian cancer pancreatic cancer or melanoma . She has had recent left breast cysts that were aspirated in [place] and per her report were noted to be benign . -yrly MMG with PCP (last done 12/31/2017) - negative   7) history of depression was previously on Lexapro currently not on  any medications . Lost her husband 2009 to throat cancer .  PLAN:  -discussed lab results on 11/23/2022 in detail with patient. CBC showed WBC of 4.4K, hemoglobin of 13.7, and platelets of 138K. -platelet count has significantly improved -WBC and hemoglobin normal -CMP normal  -LDH normal -Patient has been tolerating Rituxan well with no significant toxicity issues -continue Rituxan treatment to manage the enlarged spleen with same premedications -discussed role for repeat CT abdomen or [place] in 2-3 months to evaluate spleen size and making treatment decisions -discussed details of maintenance Rituxan treatment option every 2 months for a year or only monitoring disease based on scan results -answered all of patient's questions in detail regarding maintenance treatment, diet and lab workup -informed patient that Rituxan generally causes lymphocytes to be low. In patient's case, her lymphocyte count is borderline low in 500s, which is not concerning -not unreasonable to cut down refined sugars, but would not recommend any crash diets. Recommend a balanced diet with natural and whole fruits/vegetables.  -discussed goal to keep vitamin D levels high to strengthen the immune system. Recommend patient to take 2000 units of vitamin D daily -patient is planning to receive a mammogram on 11/29/2022 to evaluate breast lesions   FOLLOW-UP: Follow-up for breast imaging as scheduled on 8/15 CT abdomen without contrast to reevaluate for resolution of splenomegaly in 6  weeks Return to clinic with Dr. Candise Che with labs in 8 weeks  The total time spent in the appointment was 20 minutes* .  All of the patient's questions were answered with apparent satisfaction. The patient knows to call the clinic with any problems, questions or concerns.   Wyvonnia Lora MD MS AAHIVMS [facility] Hematology/Oncology Physician [facility]  .*Total Encounter Time as defined by the Centers for Medicare and Medicaid Services includes, in addition to the face-to-face time of a patient visit (documented in the note above) non-face-to-face time: obtaining and reviewing outside history, ordering and reviewing medications, tests or procedures, care coordination (communications with other health care professionals or caregivers) and documentation in the medical record.    I,Mitra Faeizi,acting as a Neurosurgeon for Wyvonnia Lora, MD.,have documented all relevant documentation on the behalf of Wyvonnia Lora, MD,as directed by  Wyvonnia Lora, MD while in the presence of Wyvonnia Lora, MD.  .I have reviewed the above documentation for accuracy and completeness, and I agree with the above. Johney Maine MD

## 2022-11-29 ENCOUNTER — Encounter: Payer: Self-pay | Admitting: Hematology

## 2022-11-29 ENCOUNTER — Ambulatory Visit
Admission: RE | Admit: 2022-11-29 | Discharge: 2022-11-29 | Disposition: A | Discharge status: Home / Self Care | Payer: Medicare (Managed Care) | Source: Ambulatory Visit | Attending: Hematology | Admitting: Hematology

## 2022-11-29 ENCOUNTER — Other Ambulatory Visit: Payer: Self-pay

## 2022-11-29 DIAGNOSIS — C8307 Small cell B-cell lymphoma, spleen: Secondary | ICD-10-CM

## 2022-11-29 DIAGNOSIS — N6012 Diffuse cystic mastopathy of left breast: Secondary | ICD-10-CM | POA: Diagnosis not present

## 2022-11-29 DIAGNOSIS — N6011 Diffuse cystic mastopathy of right breast: Secondary | ICD-10-CM | POA: Diagnosis not present

## 2022-11-29 DIAGNOSIS — N6313 Unspecified lump in the right breast, lower outer quadrant: Secondary | ICD-10-CM | POA: Diagnosis not present

## 2022-11-29 DIAGNOSIS — N6320 Unspecified lump in the left breast, unspecified quadrant: Secondary | ICD-10-CM | POA: Diagnosis not present

## 2022-11-30 ENCOUNTER — Other Ambulatory Visit: Payer: Self-pay

## 2022-11-30 ENCOUNTER — Inpatient Hospital Stay: Payer: Medicare (Managed Care)

## 2022-11-30 ENCOUNTER — Inpatient Hospital Stay: Payer: Medicare (Managed Care) | Admitting: Hematology

## 2022-11-30 ENCOUNTER — Ambulatory Visit: Payer: Medicare (Managed Care)

## 2022-11-30 DIAGNOSIS — Z5112 Encounter for antineoplastic immunotherapy: Secondary | ICD-10-CM | POA: Diagnosis not present

## 2022-11-30 DIAGNOSIS — C8307 Small cell B-cell lymphoma, spleen: Secondary | ICD-10-CM

## 2022-11-30 LAB — CBC WITH DIFFERENTIAL (CANCER CENTER ONLY)
Abs Immature Granulocytes: 0.01 10*3/uL (ref 0.00–0.07)
Basophils Absolute: 0 10*3/uL (ref 0.0–0.1)
Basophils Relative: 1 %
Eosinophils Absolute: 0.1 10*3/uL (ref 0.0–0.5)
Eosinophils Relative: 2 %
HCT: 41.5 % (ref 36.0–46.0)
Hemoglobin: 14 g/dL (ref 12.0–15.0)
Immature Granulocytes: 0 %
Lymphocytes Relative: 42 %
Lymphs Abs: 1.7 10*3/uL (ref 0.7–4.0)
MCH: 31.1 pg (ref 26.0–34.0)
MCHC: 33.7 g/dL (ref 30.0–36.0)
MCV: 92.2 fL (ref 80.0–100.0)
Monocytes Absolute: 0.3 10*3/uL (ref 0.1–1.0)
Monocytes Relative: 8 %
Neutro Abs: 2 10*3/uL (ref 1.7–7.7)
Neutrophils Relative %: 47 %
Platelet Count: 119 10*3/uL — ABNORMAL LOW (ref 150–400)
RBC: 4.5 MIL/uL (ref 3.87–5.11)
RDW: 15 % (ref 11.5–15.5)
WBC Count: 4.1 10*3/uL (ref 4.0–10.5)
nRBC: 0 % (ref 0.0–0.2)

## 2022-11-30 LAB — CMP (CANCER CENTER ONLY)
ALT: 12 U/L (ref 0–44)
AST: 16 U/L (ref 15–41)
Albumin: 4.3 g/dL (ref 3.5–5.0)
Alkaline Phosphatase: 62 U/L (ref 38–126)
Anion gap: 7 (ref 5–15)
BUN: 16 mg/dL (ref 8–23)
CO2: 25 mmol/L (ref 22–32)
Calcium: 8.9 mg/dL (ref 8.9–10.3)
Chloride: 110 mmol/L (ref 98–111)
Creatinine: 0.75 mg/dL (ref 0.44–1.00)
GFR, Estimated: >60 mL/min (ref >60)
Glucose, Bld: 81 mg/dL (ref 70–99)
Potassium: 4.1 mmol/L (ref 3.5–5.1)
Sodium: 142 mmol/L (ref 135–145)
Total Bilirubin: 1 mg/dL (ref 0.3–1.2)
Total Protein: 6.5 g/dL (ref 6.5–8.1)

## 2022-11-30 LAB — LACTATE DEHYDROGENASE: LDH: 177 U/L (ref 98–192)

## 2022-12-06 ENCOUNTER — Telehealth: Payer: Self-pay | Admitting: Hematology

## 2022-12-06 NOTE — Telephone Encounter (Signed)
Left patient a message regarding appointment times/dates

## 2022-12-07 ENCOUNTER — Other Ambulatory Visit: Payer: Self-pay

## 2022-12-26 ENCOUNTER — Ambulatory Visit
Admission: RE | Admit: 2022-12-26 | Discharge: 2022-12-26 | Disposition: A | Discharge status: Home / Self Care | Payer: Medicare (Managed Care) | Source: Ambulatory Visit | Attending: Family Medicine | Admitting: Family Medicine

## 2022-12-26 DIAGNOSIS — N958 Other specified menopausal and perimenopausal disorders: Secondary | ICD-10-CM | POA: Diagnosis not present

## 2022-12-26 DIAGNOSIS — M8588 Other specified disorders of bone density and structure, other site: Secondary | ICD-10-CM | POA: Diagnosis not present

## 2022-12-26 DIAGNOSIS — M810 Age-related osteoporosis without current pathological fracture: Secondary | ICD-10-CM

## 2022-12-26 DIAGNOSIS — Z90722 Acquired absence of ovaries, bilateral: Secondary | ICD-10-CM | POA: Diagnosis not present

## 2022-12-26 DIAGNOSIS — E349 Endocrine disorder, unspecified: Secondary | ICD-10-CM | POA: Diagnosis not present

## 2023-01-04 ENCOUNTER — Ambulatory Visit (HOSPITAL_COMMUNITY)
Admission: RE | Admit: 2023-01-04 | Discharge: 2023-01-04 | Disposition: A | Discharge status: Home / Self Care | Payer: Medicare (Managed Care) | Source: Ambulatory Visit | Attending: Hematology

## 2023-01-04 DIAGNOSIS — K7689 Other specified diseases of liver: Secondary | ICD-10-CM | POA: Diagnosis not present

## 2023-01-04 DIAGNOSIS — R161 Splenomegaly, not elsewhere classified: Secondary | ICD-10-CM | POA: Diagnosis not present

## 2023-01-04 DIAGNOSIS — C8307 Small cell B-cell lymphoma, spleen: Secondary | ICD-10-CM | POA: Diagnosis not present

## 2023-01-11 DIAGNOSIS — Z01419 Encounter for gynecological examination (general) (routine) without abnormal findings: Secondary | ICD-10-CM | POA: Diagnosis not present

## 2023-01-11 DIAGNOSIS — Z6823 Body mass index (BMI) 23.0-23.9, adult: Secondary | ICD-10-CM | POA: Diagnosis not present

## 2023-01-17 ENCOUNTER — Other Ambulatory Visit: Payer: Self-pay

## 2023-01-17 DIAGNOSIS — C8307 Small cell B-cell lymphoma, spleen: Secondary | ICD-10-CM

## 2023-01-18 ENCOUNTER — Inpatient Hospital Stay: Payer: Medicare (Managed Care) | Attending: Hematology

## 2023-01-18 ENCOUNTER — Inpatient Hospital Stay: Payer: Medicare (Managed Care) | Admitting: Hematology

## 2023-01-18 VITALS — BP 125/66 | HR 50 | Temp 97.5°F | Resp 17 | Wt 133.4 lb

## 2023-01-18 DIAGNOSIS — C8307 Small cell B-cell lymphoma, spleen: Secondary | ICD-10-CM

## 2023-01-18 DIAGNOSIS — Z7189 Other specified counseling: Secondary | ICD-10-CM | POA: Diagnosis not present

## 2023-01-18 LAB — CBC WITH DIFFERENTIAL (CANCER CENTER ONLY)
Abs Immature Granulocytes: 0.01 10*3/uL (ref 0.00–0.07)
Basophils Absolute: 0 10*3/uL (ref 0.0–0.1)
Basophils Relative: 1 %
Eosinophils Absolute: 0.1 10*3/uL (ref 0.0–0.5)
Eosinophils Relative: 2 %
HCT: 43.9 % (ref 36.0–46.0)
Hemoglobin: 14.9 g/dL (ref 12.0–15.0)
Immature Granulocytes: 0 %
Lymphocytes Relative: 27 %
Lymphs Abs: 1.4 10*3/uL (ref 0.7–4.0)
MCH: 32 pg (ref 26.0–34.0)
MCHC: 33.9 g/dL (ref 30.0–36.0)
MCV: 94.2 fL (ref 80.0–100.0)
Monocytes Absolute: 0.3 10*3/uL (ref 0.1–1.0)
Monocytes Relative: 6 %
Neutro Abs: 3.4 10*3/uL (ref 1.7–7.7)
Neutrophils Relative %: 64 %
Platelet Count: 128 10*3/uL — ABNORMAL LOW (ref 150–400)
RBC: 4.66 MIL/uL (ref 3.87–5.11)
RDW: 13.6 % (ref 11.5–15.5)
WBC Count: 5.2 10*3/uL (ref 4.0–10.5)
nRBC: 0 % (ref 0.0–0.2)

## 2023-01-18 LAB — CMP (CANCER CENTER ONLY)
ALT: 12 U/L (ref 0–44)
AST: 16 U/L (ref 15–41)
Albumin: 4.5 g/dL (ref 3.5–5.0)
Alkaline Phosphatase: 80 U/L (ref 38–126)
Anion gap: 6 (ref 5–15)
BUN: 18 mg/dL (ref 8–23)
CO2: 27 mmol/L (ref 22–32)
Calcium: 9.7 mg/dL (ref 8.9–10.3)
Chloride: 108 mmol/L (ref 98–111)
Creatinine: 0.77 mg/dL (ref 0.44–1.00)
GFR, Estimated: >60 mL/min (ref >60)
Glucose, Bld: 95 mg/dL (ref 70–99)
Potassium: 4.1 mmol/L (ref 3.5–5.1)
Sodium: 141 mmol/L (ref 135–145)
Total Bilirubin: 0.6 mg/dL (ref 0.3–1.2)
Total Protein: 6.6 g/dL (ref 6.5–8.1)

## 2023-01-18 LAB — LACTATE DEHYDROGENASE: LDH: 191 U/L (ref 98–192)

## 2023-01-18 NOTE — Progress Notes (Signed)
HEMATOLOGY/ONCOLOGY CLINIC VISIT NOTE  Date of Service: 01/18/23   Patient Care Team: [name], MD as PCP - General (Family Medicine)  CHIEF COMPLAINTS/PURPOSE OF CONSULTATION:   F/u for splenic marginal zone lymphoma  HISTORY OF PRESENTING ILLNESS:   Please see previous notes for details on initial presentation  INTERVAL HISTORY:  Joanne Miller is a 72 y.o. female here for continued evaluation and management of her splenic marginal zone lymphoma. Patient was last seen by me on 11/23/2022 and reported mild fatigue and stable breast discomfort.   Today, she reports that a couple days ago, she endorsed bothersome fatigue which limited her mobility. Patient reports having an occasional cramp over the spleen. Patient denies any infection issues or leg swelling.   Patient reports bothersome aches related to body response to previous covid-19 and flu shots lasting two days. Her symptoms did include body aches, but she denies any allergic reaction to the vaccines. She reports that she may have received the RSV vaccine last year.   Patient reports that she will be traveling to [facility] for some time and will be available for treatment between November 5 - 27.   MEDICAL HISTORY:   #1 Dyslipidemia - not on any medications #2 GERD #3 hot flashes menopausal status was previously on Climara patch #4 history of sebaceous cysts. #5 history of recent left breast cysts - aspirated by ultrasound guidance and noted to be negative for cancer. #6 family history of breast cancer in her sister when she was in her 30s. No genetic testing done at that time.  SURGICAL HISTORY:  #1 breast cysts 2 aspiration on the left side . #2 root canal treatments #3 sebaceous cyst excision   SOCIAL HISTORY: Social History   Socioeconomic History   Marital status: Widowed    Spouse name: Not on file   Number of children: Not on file   Years of education: Not on file   Highest education level:  Not on file  Occupational History   Not on file  Tobacco Use   Smoking status: Never   Smokeless tobacco: Never  Vaping Use   Vaping status: Never Used  Substance and Sexual Activity   Alcohol use: Yes    Alcohol/week: 1.0 standard drink of alcohol    Types: 1 Glasses of wine per week    Comment: occasional   Drug use: No   Sexual activity: Not on file  Other Topics Concern   Not on file  Social History Narrative   Not on file   Social Determinants of Health   Financial Resource Strain: Not on file  Food Insecurity: No Food Insecurity (02/23/2022)   Received from [facility]   Hunger Vital Sign  Transportation Needs: Not on file  Physical Activity: Not on file  Stress: Not on file  Social Connections: Not on file  Intimate Partner Violence: Not on file    FAMILY HISTORY: Family History  Problem Relation Age of Onset   Breast cancer Sister 73   Breast cancer Maternal Aunt        diagnosed in her 60-70s    ALLERGIES:  is allergic to amoxicillin-pot clavulanate, medroxyprogesterone acetate, ruxience [rituximab-pvvr], and codeine.  MEDICATIONS:  Current Outpatient Medications  Medication Sig Dispense Refill   cholecalciferol (VITAMIN D3) 25 MCG (1000 UNIT) tablet Take 1,000 Units by mouth daily.     ondansetron (ZOFRAN) 4 MG tablet Take 1 tablet (4 mg total) by mouth every 8 (eight) hours as needed  for nausea or vomiting. (Patient not taking: Reported on 11/06/2022) 20 tablet 0   vitamin E 100 UNIT capsule Take by mouth daily.     No current facility-administered medications for this visit.    REVIEW OF SYSTEMS:    10 Point review of Systems was done is negative except as noted above.   PHYSICAL EXAMINATION: .There were no vitals taken for this visit.   GENERAL:alert, in no acute distress and comfortable SKIN: no acute rashes, no significant lesions EYES: conjunctiva are pink and non-injected, sclera anicteric OROPHARYNX: MMM, no exudates, no oropharyngeal  erythema or ulceration NECK: supple, no JVD LYMPH:  no palpable lymphadenopathy in the cervical, axillary or inguinal regions LUNGS: clear to auscultation b/l with normal respiratory effort HEART: regular rate & rhythm ABDOMEN:  normoactive bowel sounds , non tender, not distended. Extremity: no pedal edema PSYCH: alert & oriented x 3 with fluent speech NEURO: no focal motor/sensory deficits   LABORATORY DATA:  I have reviewed the data as listed  .    Latest Ref Rng & Units 11/30/2022    9:22 AM 11/23/2022   10:20 AM 11/13/2022   12:30 PM  CBC  WBC 4.0 - 10.5 K/uL 4.1  4.4  6.6   Hemoglobin 12.0 - 15.0 g/dL 78.2  95.6  21.3   Hematocrit 36.0 - 46.0 % 41.5  39.7  41.2   Platelets 150 - 400 K/uL 119  138  164    . CBC    Component Value Date/Time   WBC 4.1 11/30/2022 0922   WBC 6.7 03/02/2022 1738   RBC 4.50 11/30/2022 0922   HGB 14.0 11/30/2022 0922   HGB 14.0 03/22/2017 0950   HCT 41.5 11/30/2022 0922   HCT 41.9 03/22/2017 0950   PLT 119 (L) 11/30/2022 0922   PLT 136 (L) 03/22/2017 0950   MCV 92.2 11/30/2022 0922   MCV 93.3 03/22/2017 0950   MCH 31.1 11/30/2022 0922   MCHC 33.7 11/30/2022 0922   RDW 15.0 11/30/2022 0922   RDW 15.0 (H) 03/22/2017 0950   LYMPHSABS 1.7 11/30/2022 0922   LYMPHSABS 1.5 03/22/2017 0950   MONOABS 0.3 11/30/2022 0922   MONOABS 0.3 03/22/2017 0950   EOSABS 0.1 11/30/2022 0922   EOSABS 0.1 03/22/2017 0950   BASOSABS 0.0 11/30/2022 0922   BASOSABS 0.0 03/22/2017 0950    .    Latest Ref Rng & Units 11/30/2022    9:22 AM 11/23/2022   10:20 AM 11/13/2022   12:30 PM  CMP  Glucose 70 - 99 mg/dL 81  95  72   BUN 8 - 23 mg/dL 16  19  17    Creatinine 0.44 - 1.00 mg/dL 0.86  5.78  4.69   Sodium 135 - 145 mmol/L 142  139  139   Potassium 3.5 - 5.1 mmol/L 4.1  4.1  3.6   Chloride 98 - 111 mmol/L 110  107  107   CO2 22 - 32 mmol/L 25  24  23    Calcium 8.9 - 10.3 mg/dL 8.9  9.1  8.9   Total Protein 6.5 - 8.1 g/dL 6.5  6.3  6.4   Total Bilirubin  0.3 - 1.2 mg/dL 1.0  1.1  0.9   Alkaline Phos 38 - 126 U/L 62  58  61   AST 15 - 41 U/L 16  14  17    ALT 0 - 44 U/L 12  13  17     . Lab Results  Component Value Date  LDH 177 11/30/2022        Hematopathology Order10/03/2017 [facility]  BONE MARROW BIOPSY 01/25/2017: Interpretation Fourteen of the twenty metaphase cells analyzed are normal (46,XX). Six cells are abnormal and contain a deletion within the long arm of chromosome 7 and additional material of unknown origin attached to the long arm of chromosome 14 at or near the [facility] locus. Deletion of 7q is seen in mature B-cell neoplasms including marginal zone lymphoma. Ten cells were analyzed from the 24 hour unstimulated culture and ten cells were analyzed from the 72 hour culture stimulated with pokeweed mitogen, phorbol 12-myristate 13-acetate (PMA), and a CpG-oligodeoxynucleotide (ODN). The abnormal clone was only seen in the 72 hour stimulated culture.   Component Name Value Ref Range  Case Report Surgical Pathology Report                         Case: [id number]                                Authorizing Provider:  Vernie Murders,  Collected:           01/25/2017 1100                                    AGNP                                                                        Ordering Location:     [facility]      Received:            01/25/2017 1118                                    [place]                                                                 Pathologist:           Sondra Come,                                                                          MD  Specimens:   A) - Bone Marrow Right - Aspirate                                                                  B) - Bone Marrow Right - Biopsy                                                                    C) - Peripheral Blood                                                                     Final Diagnosis Bone marrow, right iliac, aspiration and biopsy -  Hypercellular bone marrow (70%) with involvement by mature B-cell lymphoma with non-specific immunophenotype (representing approximately 20% of marrow cellularity by immunohistochemical analysis) (See Comment)  -  Cytogenetic studies are pending.     RADIOGRAPHIC STUDIES: I have personally reviewed the radiological images as listed and agreed with the findings in the report. DG BONE DENSITY (DXA)  Result Date: 12/26/2022 EXAM: DUAL X-RAY ABSORPTIOMETRY (DXA) FOR BONE MINERAL DENSITY IMPRESSION: Referring Physician:  [name] Your patient completed a bone mineral density test using GE Lunar iDXA system (analysis version: 16). Technologist:    lmn PATIENT: Name: Joanne Miller, Joanne Miller Patient ID: [id number] Birth Date: 12-20-1950 Height: 62.7 in. Sex: Female Measured: 12/26/2022 Weight: 133.2 lbs. Indications: Advanced Age, Bilateral Oophorectomy (65.51), Caucasian, Estrogen Deficient, Hysterectomy, Postmenopausal Fractures: NONE Treatments: Calcium (E943.0), Vitamin D (E933.5) ASSESSMENT: The BMD measured at AP Spine L1-L4 (L2,L3) is 0.887 g/cm2 with a T-score of -2.3. This patient is considered osteopenic/low bone mass according to World Health Organization [facility]) criteria. The quality of the exam is good. L2 and L3 were excluded due to degenerative changes. Site Region Measured Date Measured Age YA BMD Significant CHANGE T-score AP Spine L1-L4 (L2,L3) 12/26/2022 72.7 -2.3 0.887 g/cm2 AP Spine L1-L4 (L2,L3) 07/26/2020 70.3 -2.5 0.861 g/cm2 DualFemur Neck Left 12/26/2022 72.7 -2.2 0.730 g/cm2 DualFemur Neck Left 07/26/2020 70.3 -2.2 0.733 g/cm2 DualFemur Total Mean 12/26/2022 72.7 -1.7 0.795 g/cm2 DualFemur Total Mean 07/26/2020 70.3 -1.8 0.777 g/cm2 World Health Organization [facility]) criteria for post-menopausal, Caucasian Women: Normal       T-score at or above -1 SD Osteopenia   T-score between -1 and  -2.5 SD Osteoporosis T-score at or below -2.5 SD RECOMMENDATION: 1. All patients should optimize calcium and vitamin D intake. 2. Consider FDA-approved medical therapies in postmenopausal women and men aged 9 years and older, based on the following: a. A hip or vertebral (clinical or morphometric) fracture. b. T-score = -2.5 at the femoral neck or spine after appropriate evaluation to exclude secondary causes. c. Low bone mass (T-score between -1.0 and -2.5 at the femoral neck or spine) and a 10-year probability of a hip fracture = 3% or a  10-year probability of a major osteoporosis-related fracture = 20% based on the [place]-adapted WHO algorithm. d. Clinician judgment and/or patient preferences may indicate treatment for people with 10-year fracture probabilities above or below these levels. FOLLOW-UP: Patients with diagnosis of osteoporosis or at high risk for fracture should have regular bone mineral density tests.? Patients eligible for Medicare are allowed routine testing every 2 years.? The testing frequency can be increased to one year for patients who have rapidly progressing disease, are receiving or discontinuing medical therapy to restore bone mass, or have additional risk factors. I have reviewed this study and agree with the findings. [facility] FRAX* 10-year Probability of Fracture Based on femoral neck BMD: DualFemur (Left) Major Osteoporotic Fracture: 13.8% Hip Fracture:                3.5% Population:                  [place] (Caucasian) Risk Factors:                None *FRAX is a Armed forces logistics/support/administrative officer of the [employer] of [employer] for Metabolic Bone Disease, a World Science writer (WHO) [employer]. ASSESSMENT: The probability of a major osteoporotic fracture is 13.8% within the next ten years. The probability of a hip fracture is 3.5% within the next ten years. Electronically Signed   By: Baird Lyons M.D.   On: 12/26/2022 12:23    ASSESSMENT & PLAN:   72  y.o. female with   1) history of splenic marginal zone lymphoma. Presented with splenomegaly and cytopenia with constitutional sypmtoms fatigue and night sweats - now resolved with Rituxan treatment. HIV, hepatitis C and hepatitis B serologies unrevealing  SPEP shows no M spike and negative IFE.  BM Bx done at [facility] -  Hypercellular bone marrow (70%) with involvement by mature B-cell lymphoma with non-specific immunophenotype (representing approximately 20% of marrow cellularity by immunohistochemical analysis) Cytogenetics 7q deletion  2) Hot flashes grade 2  Patient declines consideration of medications for this.  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  Minimal and stable.  4) history of Rituxan related Rash - grade 1. Resolved and did not recur with dose 2 and 3 and 4 with adjusted premedications.  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 Patient notes that her sister had breast cancer in her 30s . Has not had genetic testing . No other family history of breast cancer ovarian cancer pancreatic cancer or melanoma . She has had recent left breast cysts that were aspirated in [place] and per her report were noted to be benign . -yrly MMG with PCP (last done 12/31/2017) - negative   7) history of depression was previously on Lexapro currently not on any medications . Lost her husband 2009 to throat cancer .  PLAN:  -Discussed lab results on 01/18/23 in detail with patient. CBC showed WBC of 5.2K, hemoglobin of 14.9, and platelets improved to 128K. -CBC shows mild thrombocytopenia, otherwise stable -CMP normal -LDH normal -CT abdomen scan on 01/04/2023 showed that her splenomegaly improved from 17.5 cm to 13.7 cm -spleen is slightly enlarged at this time; no other enlarged lymph nodes -educated patient that sometimes lymphoma produces supportive tissue in the spleen which may play a role in spleen  size -discussed option to only monitor or to try to get a deeper response with maintenance Rituxan every two months for  a year to continue to shrink spleen as much as possible -patient would like to proceed with maintenance Rituxan every 2 months for 1 year, starting in 5-6 weeks from now -Advised patient to stay UTD with age-appropriate vaccinations, including flu, COVID-19 booster, pneumonia, and RSV.  -would recommend to space out COVID-19 and flu vaccines by 2-3 weeks given her body response to vaccines previously -recommend patient to [facility] with [facility] to confirm her vaccination history -answered all of patient's questions in detail   FOLLOW-UP: Schedule to start maintenance Rituxan every 2 months with labs and MD visits starting in 5 weeks. (Please schedule next 2 doses)  The total time spent in the appointment was *** minutes* .  All of the patient's questions were answered with apparent satisfaction. The patient knows to call the clinic with any problems, questions or concerns.   Wyvonnia Lora MD MS AAHIVMS [facility] Hematology/Oncology Physician [facility]  .*Total Encounter Time as defined by the Centers for Medicare and Medicaid Services includes, in addition to the face-to-face time of a patient visit (documented in the note above) non-face-to-face time: obtaining and reviewing outside history, ordering and reviewing medications, tests or procedures, care coordination (communications with other health care professionals or caregivers) and documentation in the medical record.    I,Mitra Faeizi,acting as a Neurosurgeon for Wyvonnia Lora, MD.,have documented all relevant documentation on the behalf of Wyvonnia Lora, MD,as directed by  Wyvonnia Lora, MD while in the presence of Wyvonnia Lora, MD.  ***

## 2023-01-24 ENCOUNTER — Encounter: Payer: Self-pay | Admitting: Hematology

## 2023-01-25 ENCOUNTER — Telehealth: Payer: Self-pay | Admitting: Hematology

## 2023-01-25 NOTE — Telephone Encounter (Signed)
Left patient a message in regrads to schedule appointment time/date for flow/up and infusion treatments

## 2023-01-26 ENCOUNTER — Other Ambulatory Visit: Payer: Self-pay

## 2023-01-27 ENCOUNTER — Other Ambulatory Visit: Payer: Self-pay

## 2023-02-21 ENCOUNTER — Other Ambulatory Visit: Payer: Self-pay | Admitting: *Deleted

## 2023-02-21 DIAGNOSIS — C8307 Small cell B-cell lymphoma, spleen: Secondary | ICD-10-CM

## 2023-02-22 ENCOUNTER — Inpatient Hospital Stay: Payer: Medicare (Managed Care) | Attending: Hematology

## 2023-02-22 VITALS — BP 124/60 | HR 74 | Temp 98.1°F | Resp 14 | Wt 135.8 lb

## 2023-02-22 DIAGNOSIS — Z5112 Encounter for antineoplastic immunotherapy: Secondary | ICD-10-CM | POA: Diagnosis not present

## 2023-02-22 DIAGNOSIS — C8307 Small cell B-cell lymphoma, spleen: Secondary | ICD-10-CM

## 2023-02-22 DIAGNOSIS — Z5111 Encounter for antineoplastic chemotherapy: Secondary | ICD-10-CM | POA: Diagnosis not present

## 2023-02-22 DIAGNOSIS — Z7189 Other specified counseling: Secondary | ICD-10-CM

## 2023-02-22 LAB — CBC WITH DIFFERENTIAL (CANCER CENTER ONLY)
Abs Immature Granulocytes: 0.02 10*3/uL (ref 0.00–0.07)
Basophils Absolute: 0 10*3/uL (ref 0.0–0.1)
Basophils Relative: 1 %
Eosinophils Absolute: 0.1 10*3/uL (ref 0.0–0.5)
Eosinophils Relative: 3 %
HCT: 45.4 % (ref 36.0–46.0)
Hemoglobin: 15.6 g/dL — ABNORMAL HIGH (ref 12.0–15.0)
Immature Granulocytes: 0 %
Lymphocytes Relative: 32 %
Lymphs Abs: 1.7 10*3/uL (ref 0.7–4.0)
MCH: 31.7 pg (ref 26.0–34.0)
MCHC: 34.4 g/dL (ref 30.0–36.0)
MCV: 92.3 fL (ref 80.0–100.0)
Monocytes Absolute: 0.3 10*3/uL (ref 0.1–1.0)
Monocytes Relative: 6 %
Neutro Abs: 3.3 10*3/uL (ref 1.7–7.7)
Neutrophils Relative %: 58 %
Platelet Count: 151 10*3/uL (ref 150–400)
RBC: 4.92 MIL/uL (ref 3.87–5.11)
RDW: 13 % (ref 11.5–15.5)
WBC Count: 5.5 10*3/uL (ref 4.0–10.5)
nRBC: 0 % (ref 0.0–0.2)

## 2023-02-22 LAB — CMP (CANCER CENTER ONLY)
ALT: 14 U/L (ref 0–44)
AST: 18 U/L (ref 15–41)
Albumin: 4.6 g/dL (ref 3.5–5.0)
Alkaline Phosphatase: 82 U/L (ref 38–126)
Anion gap: 8 (ref 5–15)
BUN: 17 mg/dL (ref 8–23)
CO2: 27 mmol/L (ref 22–32)
Calcium: 9.8 mg/dL (ref 8.9–10.3)
Chloride: 107 mmol/L (ref 98–111)
Creatinine: 0.81 mg/dL (ref 0.44–1.00)
GFR, Estimated: >60 mL/min (ref >60)
Glucose, Bld: 88 mg/dL (ref 70–99)
Potassium: 3.8 mmol/L (ref 3.5–5.1)
Sodium: 142 mmol/L (ref 135–145)
Total Bilirubin: 0.6 mg/dL (ref <1.2)
Total Protein: 6.9 g/dL (ref 6.5–8.1)

## 2023-02-22 LAB — LACTATE DEHYDROGENASE: LDH: 178 U/L (ref 98–192)

## 2023-02-22 MED ORDER — SODIUM CHLORIDE 0.9 % IV SOLN
375.0000 mg/m2 | Freq: Once | INTRAVENOUS | Status: AC
  Administered 2023-02-22: 600 mg via INTRAVENOUS
  Filled 2023-02-22: qty 50

## 2023-02-22 MED ORDER — ACETAMINOPHEN 325 MG PO TABS
650.0000 mg | ORAL_TABLET | Freq: Once | ORAL | Status: AC
  Administered 2023-02-22: 650 mg via ORAL
  Filled 2023-02-22: qty 2

## 2023-02-22 MED ORDER — CETIRIZINE HCL 10 MG/ML IV SOLN
10.0000 mg | Freq: Once | INTRAVENOUS | Status: AC
Start: 2023-02-22 — End: 2023-02-22
  Administered 2023-02-22: 10 mg via INTRAVENOUS
  Filled 2023-02-22: qty 1

## 2023-02-22 MED ORDER — FAMOTIDINE IN NACL 20-0.9 MG/50ML-% IV SOLN
20.0000 mg | Freq: Once | INTRAVENOUS | Status: AC
Start: 2023-02-22 — End: 2023-02-22
  Administered 2023-02-22: 20 mg via INTRAVENOUS
  Filled 2023-02-22: qty 50

## 2023-02-22 MED ORDER — METHYLPREDNISOLONE SODIUM SUCC 125 MG IJ SOLR
125.0000 mg | Freq: Once | INTRAMUSCULAR | Status: AC
Start: 2023-02-22 — End: 2023-02-22
  Administered 2023-02-22: 125 mg via INTRAVENOUS
  Filled 2023-02-22: qty 2

## 2023-02-22 MED ORDER — SODIUM CHLORIDE 0.9 % IV SOLN: Freq: Once | INTRAVENOUS | Status: AC

## 2023-02-22 NOTE — Patient Instructions (Signed)
[facility] - A DEPT OF [facility]  Discharge Instructions: Thank you for choosing [facility] to provide your oncology and hematology care.   If you have a lab appointment with the Cancer Center, please go directly to the Cancer Center and check in at the registration area.   Wear comfortable clothing and clothing appropriate for easy access to any Portacath or PICC line.   We strive to give you quality time with your provider. You may need to reschedule your appointment if you arrive late (15 or more minutes).  Arriving late affects you and other patients whose appointments are after yours.  Also, if you miss three or more appointments without notifying the office, you may be dismissed from the clinic at the provider's discretion.      For prescription refill requests, have your pharmacy contact our office and allow 72 hours for refills to be completed.    Today you received the following chemotherapy and/or immunotherapy agents: Rituximab      To help prevent nausea and vomiting after your treatment, we encourage you to take your nausea medication as directed.  BELOW ARE SYMPTOMS THAT SHOULD BE REPORTED IMMEDIATELY: *FEVER GREATER THAN 100.4 F (38 C) OR HIGHER *CHILLS OR SWEATING *NAUSEA AND VOMITING THAT IS NOT CONTROLLED WITH YOUR NAUSEA MEDICATION *UNUSUAL SHORTNESS OF BREATH *UNUSUAL BRUISING OR BLEEDING *URINARY PROBLEMS (pain or burning when urinating, or frequent urination) *BOWEL PROBLEMS (unusual diarrhea, constipation, pain near the anus) TENDERNESS IN MOUTH AND THROAT WITH OR WITHOUT PRESENCE OF ULCERS (sore throat, sores in mouth, or a toothache) UNUSUAL RASH, SWELLING OR PAIN  UNUSUAL VAGINAL DISCHARGE OR ITCHING   Items with * indicate a potential emergency and should be followed up as soon as possible or go to the Emergency Department if any problems should occur.  Please show the CHEMOTHERAPY ALERT CARD or IMMUNOTHERAPY  ALERT CARD at check-in to the Emergency Department and triage nurse.  Should you have questions after your visit or need to cancel or reschedule your appointment, please contact [facility] - A DEPT OF [facility]  Dept: [phone]  and follow the prompts.  Office hours are 8:00 a.m. to 4:30 p.m. Monday - Friday. Please note that voicemails left after 4:00 p.m. may not be returned until the following business day.  We are closed weekends and major holidays. You have access to a nurse at all times for urgent questions. Please call the main number to the clinic Dept: [phone] and follow the prompts.   For any non-urgent questions, you may also contact your provider using MyChart. We now offer e-Visits for anyone 86 and older to request care online for non-urgent symptoms. For details visit [email or web address].   Also download the MyChart app! Go to the app store, search "MyChart", open the app, select [place], and log in with your MyChart username and password.

## 2023-03-05 DIAGNOSIS — N3941 Urge incontinence: Secondary | ICD-10-CM | POA: Diagnosis not present

## 2023-03-05 DIAGNOSIS — N812 Incomplete uterovaginal prolapse: Secondary | ICD-10-CM | POA: Diagnosis not present

## 2023-04-25 ENCOUNTER — Other Ambulatory Visit: Payer: Self-pay

## 2023-04-25 DIAGNOSIS — C8307 Small cell B-cell lymphoma, spleen: Secondary | ICD-10-CM

## 2023-04-25 NOTE — Progress Notes (Signed)
 HEMATOLOGY/ONCOLOGY CLINIC VISIT NOTE  Date of Service: 04/26/2023  Patient Care Team: Kip Righter, MD as PCP - General (Family Medicine)  CHIEF COMPLAINTS/PURPOSE OF CONSULTATION:   F/u for splenic marginal zone lymphoma  HISTORY OF PRESENTING ILLNESS:   Please see previous notes for details on initial presentation  INTERVAL HISTORY:  Joanne Miller is a 73 y.o. female here for continued evaluation and management of her splenic marginal zone lymphoma. Patient was last seen by me on 01/18/2023 and reported bothersome fatigue limiting her mobility, occasional cramping sensation over the spleen, body aches related to COVID-19 and flu vaccinations.   Today, she presents for toxicity check prior to receiving day 1 of maintenance Rituxan .   Patient reports that she is UTD with her age-appropriate vaccines and notes receiving the RSV vaccine last year.  Patient reports some spleen discomfort, though she denies any pain in the area. She denies any leg swelling, fever, chills, night sweats, or infection issues.   MEDICAL HISTORY:   #1 Dyslipidemia - not on any medications #2 GERD #3 hot flashes menopausal status was previously on Climara patch #4 history of sebaceous cysts. #5 history of recent left breast cysts - aspirated by ultrasound guidance and noted to be negative for cancer. #6 family history of breast cancer in her sister when she was in her 30s. No genetic testing done at that time.  SURGICAL HISTORY:  #1 breast cysts 2 aspiration on the left side . #2 root canal treatments #3 sebaceous cyst excision   SOCIAL HISTORY: Social History   Socioeconomic History   Marital status: Widowed    Spouse name: Not on file   Number of children: Not on file   Years of education: Not on file   Highest education level: Not on file  Occupational History   Not on file  Tobacco Use   Smoking status: Never   Smokeless tobacco: Never  Vaping Use   Vaping status: Never Used   Substance and Sexual Activity   Alcohol use: Yes    Alcohol/week: 1.0 standard drink of alcohol    Types: 1 Glasses of wine per week    Comment: occasional   Drug use: No   Sexual activity: Not on file  Other Topics Concern   Not on file  Social History Narrative   Not on file   Social Drivers of Health   Financial Resource Strain: Not on file  Food Insecurity: No Food Insecurity (02/23/2022)   Received from [facility]   Hunger Vital Sign  Transportation Needs: Not on file  Physical Activity: Not on file  Stress: Not on file  Social Connections: Not on file  Intimate Partner Violence: Not on file    FAMILY HISTORY: Family History  Problem Relation Age of Onset   Breast cancer Sister 88   Breast cancer Maternal Aunt        diagnosed in her 60-70s    ALLERGIES:  is allergic to amoxicillin-pot clavulanate, medroxyprogesterone acetate, ruxience  [rituximab -pvvr], and codeine.  MEDICATIONS:  Current Outpatient Medications  Medication Sig Dispense Refill   cholecalciferol (VITAMIN D3) 25 MCG (1000 UNIT) tablet Take 1,000 Units by mouth daily.     ondansetron  (ZOFRAN ) 4 MG tablet Take 1 tablet (4 mg total) by mouth every 8 (eight) hours as needed for nausea or vomiting. (Patient not taking: Reported on 11/06/2022) 20 tablet 0   vitamin E 100 UNIT capsule Take by mouth daily.     No current facility-administered medications for  this visit.    REVIEW OF SYSTEMS:    10 Point review of Systems was done is negative except as noted above.   PHYSICAL EXAMINATION: VSS GENERAL:alert, in no acute distress and comfortable SKIN: no acute rashes, no significant lesions EYES: conjunctiva are pink and non-injected, sclera anicteric OROPHARYNX: MMM, no exudates, no oropharyngeal erythema or ulceration NECK: supple, no JVD LYMPH:  no palpable lymphadenopathy in the cervical, axillary or inguinal regions LUNGS: clear to auscultation b/l with normal respiratory effort HEART:  regular rate & rhythm ABDOMEN:  normoactive bowel sounds , non tender, not distended. Extremity: no pedal edema PSYCH: alert & oriented x 3 with fluent speech NEURO: no focal motor/sensory deficits   LABORATORY DATA:  I have reviewed the data as listed  .    Latest Ref Rng & Units 04/26/2023   10:51 AM 02/22/2023   10:09 AM 01/18/2023    9:16 AM  CBC  WBC 4.0 - 10.5 K/uL 5.4  5.5  5.2   Hemoglobin 12.0 - 15.0 g/dL 84.4  84.3  85.0   Hematocrit 36.0 - 46.0 % 44.7  45.4  43.9   Platelets 150 - 400 K/uL 152  151  128    . CBC    Component Value Date/Time   WBC 5.4 04/26/2023 1051   WBC 6.7 03/02/2022 1738   RBC 4.87 04/26/2023 1051   HGB 15.5 (H) 04/26/2023 1051   HGB 14.0 03/22/2017 0950   HCT 44.7 04/26/2023 1051   HCT 41.9 03/22/2017 0950   PLT 152 04/26/2023 1051   PLT 136 (L) 03/22/2017 0950   MCV 91.8 04/26/2023 1051   MCV 93.3 03/22/2017 0950   MCH 31.8 04/26/2023 1051   MCHC 34.7 04/26/2023 1051   RDW 13.5 04/26/2023 1051   RDW 15.0 (H) 03/22/2017 0950   LYMPHSABS 1.4 04/26/2023 1051   LYMPHSABS 1.5 03/22/2017 0950   MONOABS 0.3 04/26/2023 1051   MONOABS 0.3 03/22/2017 0950   EOSABS 0.1 04/26/2023 1051   EOSABS 0.1 03/22/2017 0950   BASOSABS 0.0 04/26/2023 1051   BASOSABS 0.0 03/22/2017 0950    .    Latest Ref Rng & Units 04/26/2023   10:51 AM 02/22/2023   10:09 AM 01/18/2023    9:16 AM  CMP  Glucose 70 - 99 mg/dL 74  88  95   BUN 8 - 23 mg/dL 19  17  18    Creatinine 0.44 - 1.00 mg/dL 9.20  9.18  9.22   Sodium 135 - 145 mmol/L 142  142  141   Potassium 3.5 - 5.1 mmol/L 4.0  3.8  4.1   Chloride 98 - 111 mmol/L 106  107  108   CO2 22 - 32 mmol/L 29  27  27    Calcium 8.9 - 10.3 mg/dL 89.9  9.8  9.7   Total Protein 6.5 - 8.1 g/dL 7.1  6.9  6.6   Total Bilirubin 0.0 - 1.2 mg/dL 0.7  0.6  0.6   Alkaline Phos 38 - 126 U/L 90  82  80   AST 15 - 41 U/L 20  18  16    ALT 0 - 44 U/L 16  14  12     . Lab Results  Component Value Date   LDH 171 04/26/2023         Hematopathology Order10/03/2017 [facility]  BONE MARROW BIOPSY 01/25/2017: Interpretation Fourteen of the twenty metaphase cells analyzed are normal (46,XX). Six cells are abnormal and contain a deletion within the  long arm of chromosome 7 and additional material of unknown origin attached to the long arm of chromosome 14 at or near the [facility] locus. Deletion of 7q is seen in mature B-cell neoplasms including marginal zone lymphoma. Ten cells were analyzed from the 24 hour unstimulated culture and ten cells were analyzed from the 72 hour culture stimulated with pokeweed mitogen, phorbol 12-myristate 13-acetate (PMA), and a CpG-oligodeoxynucleotide (ODN). The abnormal clone was only seen in the 72 hour stimulated culture.   Component Name Value Ref Range  Case Report Surgical Pathology Report                         Case: [id number]                                Authorizing Provider:  Asberry Nemiah Adjutant,  Collected:           01/25/2017 1100                                    AGNP                                                                        Ordering Location:     [facility]      Received:            01/25/2017 1118                                    [place]                                                                 Pathologist:           Corean Zulema Hoots,                                                                          MD                                                                          Specimens:   A) - Bone Marrow Right - Aspirate  B) - Bone Marrow Right - Biopsy                                                                    C) - Peripheral Blood                                                                    Final Diagnosis Bone marrow, right iliac, aspiration and biopsy -  Hypercellular bone marrow (70%) with involvement by mature B-cell lymphoma  with non-specific immunophenotype (representing approximately 20% of marrow cellularity by immunohistochemical analysis) (See Comment)  -  Cytogenetic studies are pending.     RADIOGRAPHIC STUDIES: I have personally reviewed the radiological images as listed and agreed with the findings in the report. No results found.  ASSESSMENT & PLAN:   73 y.o. female with   1) history of splenic marginal zone lymphoma. Presented with splenomegaly and cytopenia with constitutional sypmtoms fatigue and night sweats - now resolved with Rituxan  treatment. HIV, hepatitis C and hepatitis B serologies unrevealing  SPEP shows no M spike and negative IFE.  BM Bx done at [facility] -  Hypercellular bone marrow (70%) with involvement by mature B-cell lymphoma with non-specific immunophenotype (representing approximately 20% of marrow cellularity by immunohistochemical analysis) Cytogenetics 7q deletion  2) Hot flashes grade 2  Patient declines consideration of medications for this.  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  Minimal and stable.  4) history of Rituxan  related Rash - grade 1. Resolved and did not recur with dose 2 and 3 and 4 with adjusted premedications.  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 Patient notes that her sister had breast cancer in her 30s . Has not had genetic testing . No other family history of breast cancer ovarian cancer pancreatic cancer or melanoma . She has had recent left breast cysts that were aspirated in [place] and per her report were noted to be benign . -yrly MMG with PCP (last done 12/31/2017) - negative   7) history of depression was previously on Lexapro currently not on any medications . Lost her husband 2009 to throat cancer .  PLAN:  -Discussed lab results on 04/26/2023 in detail with patient. CBC normal, showed WBC of 5.4K, hemoglobin of 15.5, and platelets of  152K. -CMP normal -LDH normal -did not feel an enlarged spleen during physical examination -patient is appropriate to receive maintenance Rituxan  every two months for one year -Continue Rituxan  with same supportive medications. -will plan for an US  down the line for reevaluation -Patient is UTD with her age-appropriate vaccinations   FOLLOW-UP: Please schedule next 2 doses of  maintenance Rituxan  every 2 months with labs   The total time spent in the appointment was 30 minutes* .  All of the patient's questions were answered with apparent satisfaction. The patient knows to call the clinic with any problems, questions or concerns.   Joanne Saran MD MS AAHIVMS [facility]  Hematology/Oncology Physician [facility]  .*Total Encounter Time as defined by the Centers for Medicare and Medicaid Services includes, in addition to the face-to-face time of a patient visit (documented in the note above) non-face-to-face time: obtaining and reviewing outside history, ordering and reviewing medications, tests or procedures, care coordination (communications with other health care professionals or caregivers) and documentation in the medical record.    I,Joanne Miller,acting as a neurosurgeon for Joanne Saran, MD.,have documented all relevant documentation on the behalf of Joanne Saran, MD,as directed by  Joanne Saran, MD while in the presence of Joanne Saran, MD.  .I have reviewed the above documentation for accuracy and completeness, and I agree with the above. .Joanne Bauserman Kishore Tennille Montelongo MD

## 2023-04-26 ENCOUNTER — Inpatient Hospital Stay: Payer: Medicare (Managed Care)

## 2023-04-26 ENCOUNTER — Inpatient Hospital Stay: Payer: Medicare (Managed Care) | Attending: Hematology | Admitting: Hematology

## 2023-04-26 VITALS — BP 114/55 | HR 64 | Temp 98.2°F | Resp 16 | Wt 137.1 lb

## 2023-04-26 DIAGNOSIS — Z5112 Encounter for antineoplastic immunotherapy: Secondary | ICD-10-CM | POA: Diagnosis present

## 2023-04-26 DIAGNOSIS — Z7189 Other specified counseling: Secondary | ICD-10-CM

## 2023-04-26 DIAGNOSIS — C8307 Small cell B-cell lymphoma, spleen: Secondary | ICD-10-CM | POA: Insufficient documentation

## 2023-04-26 DIAGNOSIS — Z79620 Long term (current) use of immunosuppressive biologic: Secondary | ICD-10-CM | POA: Diagnosis not present

## 2023-04-26 LAB — CBC WITH DIFFERENTIAL (CANCER CENTER ONLY)
Abs Immature Granulocytes: 0.03 10*3/uL (ref 0.00–0.07)
Basophils Absolute: 0 10*3/uL (ref 0.0–0.1)
Basophils Relative: 1 %
Eosinophils Absolute: 0.1 10*3/uL (ref 0.0–0.5)
Eosinophils Relative: 2 %
HCT: 44.7 % (ref 36.0–46.0)
Hemoglobin: 15.5 g/dL — ABNORMAL HIGH (ref 12.0–15.0)
Immature Granulocytes: 1 %
Lymphocytes Relative: 26 %
Lymphs Abs: 1.4 10*3/uL (ref 0.7–4.0)
MCH: 31.8 pg (ref 26.0–34.0)
MCHC: 34.7 g/dL (ref 30.0–36.0)
MCV: 91.8 fL (ref 80.0–100.0)
Monocytes Absolute: 0.3 10*3/uL (ref 0.1–1.0)
Monocytes Relative: 5 %
Neutro Abs: 3.6 10*3/uL (ref 1.7–7.7)
Neutrophils Relative %: 65 %
Platelet Count: 152 10*3/uL (ref 150–400)
RBC: 4.87 MIL/uL (ref 3.87–5.11)
RDW: 13.5 % (ref 11.5–15.5)
WBC Count: 5.4 10*3/uL (ref 4.0–10.5)
nRBC: 0 % (ref 0.0–0.2)

## 2023-04-26 LAB — CMP (CANCER CENTER ONLY)
ALT: 16 U/L (ref 0–44)
AST: 20 U/L (ref 15–41)
Albumin: 4.9 g/dL (ref 3.5–5.0)
Alkaline Phosphatase: 90 U/L (ref 38–126)
Anion gap: 7 (ref 5–15)
BUN: 19 mg/dL (ref 8–23)
CO2: 29 mmol/L (ref 22–32)
Calcium: 10 mg/dL (ref 8.9–10.3)
Chloride: 106 mmol/L (ref 98–111)
Creatinine: 0.79 mg/dL (ref 0.44–1.00)
GFR, Estimated: >60 mL/min (ref >60)
Glucose, Bld: 74 mg/dL (ref 70–99)
Potassium: 4 mmol/L (ref 3.5–5.1)
Sodium: 142 mmol/L (ref 135–145)
Total Bilirubin: 0.7 mg/dL (ref 0.0–1.2)
Total Protein: 7.1 g/dL (ref 6.5–8.1)

## 2023-04-26 LAB — LACTATE DEHYDROGENASE: LDH: 171 U/L (ref 98–192)

## 2023-04-26 MED ORDER — ACETAMINOPHEN 325 MG PO TABS
650.0000 mg | ORAL_TABLET | Freq: Once | ORAL | Status: AC
  Administered 2023-04-26: 650 mg via ORAL
  Filled 2023-04-26: qty 2

## 2023-04-26 MED ORDER — CETIRIZINE HCL 10 MG/ML IV SOLN
10.0000 mg | Freq: Once | INTRAVENOUS | Status: AC
  Administered 2023-04-26: 10 mg via INTRAVENOUS
  Filled 2023-04-26: qty 1

## 2023-04-26 MED ORDER — SODIUM CHLORIDE 0.9 % IV SOLN: Freq: Once | INTRAVENOUS | Status: AC

## 2023-04-26 MED ORDER — FAMOTIDINE IN NACL 20-0.9 MG/50ML-% IV SOLN
20.0000 mg | Freq: Once | INTRAVENOUS | Status: AC
  Administered 2023-04-26: 20 mg via INTRAVENOUS
  Filled 2023-04-26: qty 50

## 2023-04-26 MED ORDER — SODIUM CHLORIDE 0.9 % IV SOLN
375.0000 mg/m2 | Freq: Once | INTRAVENOUS | Status: AC
  Administered 2023-04-26: 600 mg via INTRAVENOUS
  Filled 2023-04-26: qty 50

## 2023-04-26 MED ORDER — METHYLPREDNISOLONE SODIUM SUCC 125 MG IJ SOLR
125.0000 mg | Freq: Once | INTRAMUSCULAR | Status: AC
  Administered 2023-04-26: 125 mg via INTRAVENOUS
  Filled 2023-04-26: qty 2

## 2023-04-26 NOTE — Patient Instructions (Addendum)
 [facility] - A DEPT OF [facility]  Discharge Instructions: Thank you for choosing [facility] to provide your oncology and hematology care.   If you have a lab appointment with the Cancer Center, please go directly to the Cancer Center and check in at the registration area.   Wear comfortable clothing and clothing appropriate for easy access to any Portacath or PICC line.   We strive to give you quality time with your provider. You may need to reschedule your appointment if you arrive late (15 or more minutes).  Arriving late affects you and other patients whose appointments are after yours.  Also, if you miss three or more appointments without notifying the office, you may be dismissed from the clinic at the provider's discretion.      For prescription refill requests, have your pharmacy contact our office and allow 72 hours for refills to be completed.    Today you received the following chemotherapy and/or immunotherapy agents rituximab        To help prevent nausea and vomiting after your treatment, we encourage you to take your nausea medication as directed.  BELOW ARE SYMPTOMS THAT SHOULD BE REPORTED IMMEDIATELY: *FEVER GREATER THAN 100.4 F (38 C) OR HIGHER *CHILLS OR SWEATING *NAUSEA AND VOMITING THAT IS NOT CONTROLLED WITH YOUR NAUSEA MEDICATION *UNUSUAL SHORTNESS OF BREATH *UNUSUAL BRUISING OR BLEEDING *URINARY PROBLEMS (pain or burning when urinating, or frequent urination) *BOWEL PROBLEMS (unusual diarrhea, constipation, pain near the anus) TENDERNESS IN MOUTH AND THROAT WITH OR WITHOUT PRESENCE OF ULCERS (sore throat, sores in mouth, or a toothache) UNUSUAL RASH, SWELLING OR PAIN  UNUSUAL VAGINAL DISCHARGE OR ITCHING   Items with * indicate a potential emergency and should be followed up as soon as possible or go to the Emergency Department if any problems should occur.  Please show the CHEMOTHERAPY ALERT CARD or IMMUNOTHERAPY  ALERT CARD at check-in to the Emergency Department and triage nurse.  Should you have questions after your visit or need to cancel or reschedule your appointment, please contact [facility] - A DEPT OF [facility]  Dept: [phone]  and follow the prompts.  Office hours are 8:00 a.m. to 4:30 p.m. Monday - Friday. Please note that voicemails left after 4:00 p.m. may not be returned until the following business day.  We are closed weekends and major holidays. You have access to a nurse at all times for urgent questions. Please call the main number to the clinic Dept: [phone] and follow the prompts.   For any non-urgent questions, you may also contact your provider using MyChart. We now offer e-Visits for anyone 77 and older to request care online for non-urgent symptoms. For details visit [email or web address].   Also download the MyChart app! Go to the app store, search MyChart, open the app, select [facility], and log in with your MyChart username and password.

## 2023-04-30 ENCOUNTER — Telehealth: Payer: Self-pay | Admitting: Hematology

## 2023-04-30 NOTE — Telephone Encounter (Signed)
 Spoke with patient confirming upcoming appointment

## 2023-05-01 ENCOUNTER — Other Ambulatory Visit: Payer: Self-pay

## 2023-05-03 ENCOUNTER — Encounter: Payer: Self-pay | Admitting: Hematology

## 2023-05-28 ENCOUNTER — Other Ambulatory Visit: Payer: Self-pay

## 2023-06-20 ENCOUNTER — Other Ambulatory Visit: Payer: Self-pay

## 2023-06-20 DIAGNOSIS — C8307 Small cell B-cell lymphoma, spleen: Secondary | ICD-10-CM

## 2023-06-21 ENCOUNTER — Inpatient Hospital Stay: Payer: Medicare (Managed Care)

## 2023-06-21 ENCOUNTER — Inpatient Hospital Stay: Payer: Medicare (Managed Care) | Attending: Hematology

## 2023-06-21 ENCOUNTER — Inpatient Hospital Stay: Payer: Medicare (Managed Care) | Admitting: Hematology

## 2023-06-21 VITALS — BP 131/65 | HR 71 | Temp 98.3°F | Resp 16

## 2023-06-21 VITALS — BP 132/64 | HR 60 | Temp 97.9°F | Resp 15 | Ht 63.0 in | Wt 137.4 lb

## 2023-06-21 DIAGNOSIS — Z79620 Long term (current) use of immunosuppressive biologic: Secondary | ICD-10-CM | POA: Insufficient documentation

## 2023-06-21 DIAGNOSIS — C8307 Small cell B-cell lymphoma, spleen: Secondary | ICD-10-CM

## 2023-06-21 DIAGNOSIS — Z5112 Encounter for antineoplastic immunotherapy: Secondary | ICD-10-CM | POA: Diagnosis not present

## 2023-06-21 DIAGNOSIS — Z7189 Other specified counseling: Secondary | ICD-10-CM

## 2023-06-21 LAB — CBC WITH DIFFERENTIAL (CANCER CENTER ONLY)
Abs Immature Granulocytes: 0.01 10*3/uL (ref 0.00–0.07)
Basophils Absolute: 0 10*3/uL (ref 0.0–0.1)
Basophils Relative: 1 %
Eosinophils Absolute: 0.1 10*3/uL (ref 0.0–0.5)
Eosinophils Relative: 2 %
HCT: 45.5 % (ref 36.0–46.0)
Hemoglobin: 15.4 g/dL — ABNORMAL HIGH (ref 12.0–15.0)
Immature Granulocytes: 0 %
Lymphocytes Relative: 32 %
Lymphs Abs: 1.5 10*3/uL (ref 0.7–4.0)
MCH: 32 pg (ref 26.0–34.0)
MCHC: 33.8 g/dL (ref 30.0–36.0)
MCV: 94.6 fL (ref 80.0–100.0)
Monocytes Absolute: 0.2 10*3/uL (ref 0.1–1.0)
Monocytes Relative: 3 %
Neutro Abs: 2.9 10*3/uL (ref 1.7–7.7)
Neutrophils Relative %: 62 %
Platelet Count: 157 10*3/uL (ref 150–400)
RBC: 4.81 MIL/uL (ref 3.87–5.11)
RDW: 13.1 % (ref 11.5–15.5)
WBC Count: 4.6 10*3/uL (ref 4.0–10.5)
nRBC: 0 % (ref 0.0–0.2)

## 2023-06-21 LAB — CMP (CANCER CENTER ONLY)
ALT: 15 U/L (ref 0–44)
AST: 18 U/L (ref 15–41)
Albumin: 4.6 g/dL (ref 3.5–5.0)
Alkaline Phosphatase: 95 U/L (ref 38–126)
Anion gap: 8 (ref 5–15)
BUN: 22 mg/dL (ref 8–23)
CO2: 27 mmol/L (ref 22–32)
Calcium: 9.1 mg/dL (ref 8.9–10.3)
Chloride: 104 mmol/L (ref 98–111)
Creatinine: 0.79 mg/dL (ref 0.44–1.00)
GFR, Estimated: >60 mL/min (ref >60)
Glucose, Bld: 122 mg/dL — ABNORMAL HIGH (ref 70–99)
Potassium: 3.9 mmol/L (ref 3.5–5.1)
Sodium: 139 mmol/L (ref 135–145)
Total Bilirubin: 0.6 mg/dL (ref 0.0–1.2)
Total Protein: 6.7 g/dL (ref 6.5–8.1)

## 2023-06-21 LAB — LACTATE DEHYDROGENASE: LDH: 169 U/L (ref 98–192)

## 2023-06-21 MED ORDER — METHYLPREDNISOLONE SODIUM SUCC 125 MG IJ SOLR
125.0000 mg | Freq: Once | INTRAMUSCULAR | Status: AC
  Administered 2023-06-21: 125 mg via INTRAVENOUS
  Filled 2023-06-21: qty 2

## 2023-06-21 MED ORDER — FAMOTIDINE IN NACL 20-0.9 MG/50ML-% IV SOLN
20.0000 mg | Freq: Once | INTRAVENOUS | Status: AC
  Administered 2023-06-21: 20 mg via INTRAVENOUS
  Filled 2023-06-21: qty 50

## 2023-06-21 MED ORDER — CETIRIZINE HCL 10 MG/ML IV SOLN
10.0000 mg | Freq: Once | INTRAVENOUS | Status: AC
  Administered 2023-06-21: 10 mg via INTRAVENOUS
  Filled 2023-06-21: qty 1

## 2023-06-21 MED ORDER — SODIUM CHLORIDE 0.9% FLUSH: 10.0000 mL | INTRAVENOUS | Status: DC | PRN

## 2023-06-21 MED ORDER — SODIUM CHLORIDE 0.9 % IV SOLN
Freq: Once | INTRAVENOUS | Status: AC
Start: 2023-06-21 — End: 2023-06-21

## 2023-06-21 MED ORDER — HEPARIN SOD (PORK) LOCK FLUSH 100 UNIT/ML IV SOLN: 500.0000 [IU] | Freq: Once | INTRAVENOUS | Status: DC | PRN

## 2023-06-21 MED ORDER — ACETAMINOPHEN 325 MG PO TABS
650.0000 mg | ORAL_TABLET | Freq: Once | ORAL | Status: AC
  Administered 2023-06-21: 650 mg via ORAL
  Filled 2023-06-21: qty 2

## 2023-06-21 MED ORDER — SODIUM CHLORIDE 0.9 % IV SOLN
375.0000 mg/m2 | Freq: Once | INTRAVENOUS | Status: AC
  Administered 2023-06-21: 600 mg via INTRAVENOUS
  Filled 2023-06-21: qty 50

## 2023-06-21 NOTE — Progress Notes (Signed)
 Patient seen by Dr. Addison Naegeli are within treatment parameters.  Labs reviewed: and are within treatment parameters.  Per physician team, patient is ready for treatment and there are NO modifications to the treatment plan.

## 2023-06-21 NOTE — Patient Instructions (Addendum)
 [facility] - A DEPT OF [facility]  Discharge Instructions: Thank you for choosing [facility] to provide your oncology and hematology care.   If you have a lab appointment with the Cancer Center, please go directly to the Cancer Center and check in at the registration area.   Wear comfortable clothing and clothing appropriate for easy access to any Portacath or PICC line.   We strive to give you quality time with your provider. You may need to reschedule your appointment if you arrive late (15 or more minutes).  Arriving late affects you and other patients whose appointments are after yours.  Also, if you miss three or more appointments without notifying the office, you may be dismissed from the clinic at the provider's discretion.      For prescription refill requests, have your pharmacy contact our office and allow 72 hours for refills to be completed.    Today you received the following chemotherapy and/or immunotherapy agents: Ruxience      To help prevent nausea and vomiting after your treatment, we encourage you to take your nausea medication as directed.  BELOW ARE SYMPTOMS THAT SHOULD BE REPORTED IMMEDIATELY: *FEVER GREATER THAN 100.4 F (38 C) OR HIGHER *CHILLS OR SWEATING *NAUSEA AND VOMITING THAT IS NOT CONTROLLED WITH YOUR NAUSEA MEDICATION *UNUSUAL SHORTNESS OF BREATH *UNUSUAL BRUISING OR BLEEDING *URINARY PROBLEMS (pain or burning when urinating, or frequent urination) *BOWEL PROBLEMS (unusual diarrhea, constipation, pain near the anus) TENDERNESS IN MOUTH AND THROAT WITH OR WITHOUT PRESENCE OF ULCERS (sore throat, sores in mouth, or a toothache) UNUSUAL RASH, SWELLING OR PAIN  UNUSUAL VAGINAL DISCHARGE OR ITCHING   Items with * indicate a potential emergency and should be followed up as soon as possible or go to the Emergency Department if any problems should occur.  Please show the CHEMOTHERAPY ALERT CARD or IMMUNOTHERAPY  ALERT CARD at check-in to the Emergency Department and triage nurse.  Should you have questions after your visit or need to cancel or reschedule your appointment, please contact [facility] A DEPT [facility]  Dept: [phone]  and follow the prompts.  Office hours are 8:00 a.m. to 4:30 p.m. Monday - Friday. Please note that voicemails left after 4:00 p.m. may not be returned until the following business day.  We are closed weekends and major holidays. You have access to a nurse at all times for urgent questions. Please call the main number to the clinic Dept: [phone] and follow the prompts.   For any non-urgent questions, you may also contact your provider using MyChart. We now offer e-Visits for anyone 88 and older to request care online for non-urgent symptoms. For details visit [email or web address].   Also download the MyChart app! Go to the app store, search "MyChart", open the app, select [place], and log in with your MyChart username and password.

## 2023-06-21 NOTE — Progress Notes (Signed)
 HEMATOLOGY/ONCOLOGY CLINIC VISIT NOTE  Date of Service: 06/21/23  Patient Care Team: [name], MD as PCP - General (Family Medicine)  CHIEF COMPLAINTS/PURPOSE OF CONSULTATION:   F/u for splenic marginal zone lymphoma  HISTORY OF PRESENTING ILLNESS:   Please see previous notes for details on initial presentation  INTERVAL HISTORY:  [name] is a 73 y.o. female here for continued evaluation and management of her splenic marginal zone lymphoma. Patient was last seen by me on 04/26/2023 and reported some spleen discomfort.  Today, she presents for toxicity check prior to receiving third maintenance Rituxan treatment.   She reports that she will be traveling to [place] in May for 2 weeks and will return by the end of May.   She reports that she is doing well overall with no infection issues or other issues with her infusion.   She complains of persistent hot flashes. Patient has tried drinking tea, exercising, and staying well hydrated. She denies any frequent caffeine in her diet.   She denies any new lumps/bumps. She reports abdominal soreness occasionally though she denies any abdominal pain at this time.   MEDICAL HISTORY:   #1 Dyslipidemia - not on any medications #2 GERD #3 hot flashes menopausal status was previously on Climara patch #4 history of sebaceous cysts. #5 history of recent left breast cysts - aspirated by ultrasound guidance and noted to be negative for cancer. #6 family history of breast cancer in her sister when she was in her 30s. No genetic testing done at that time.  SURGICAL HISTORY:  #1 breast cysts 2 aspiration on the left side . #2 root canal treatments #3 sebaceous cyst excision   SOCIAL HISTORY: Social History   Socioeconomic History   Marital status: Widowed    Spouse name: Not on file   Number of children: Not on file   Years of education: Not on file   Highest education level: Not on file  Occupational History   Not on  file  Tobacco Use   Smoking status: Never   Smokeless tobacco: Never  Vaping Use   Vaping status: Never Used  Substance and Sexual Activity   Alcohol use: Yes    Alcohol/week: 1.0 standard drink of alcohol    Types: 1 Glasses of wine per week    Comment: occasional   Drug use: No   Sexual activity: Not on file  Other Topics Concern   Not on file  Social History Narrative   Not on file   Social Drivers of Health   Financial Resource Strain: Not on file  Food Insecurity: No Food Insecurity (02/23/2022)   Received from [facility]   Hunger Vital Sign  Transportation Needs: Not on file  Physical Activity: Not on file  Stress: Not on file  Social Connections: Not on file  Intimate Partner Violence: Not on file    FAMILY HISTORY: Family History  Problem Relation Age of Onset   Breast cancer Sister 65   Breast cancer Maternal Aunt        diagnosed in her 60-70s    ALLERGIES:  is allergic to amoxicillin-pot clavulanate, medroxyprogesterone acetate, ruxience [rituximab-pvvr], and codeine.  MEDICATIONS:  Current Outpatient Medications  Medication Sig Dispense Refill   cholecalciferol (VITAMIN D3) 25 MCG (1000 UNIT) tablet Take 1,000 Units by mouth daily.     ondansetron (ZOFRAN) 4 MG tablet Take 1 tablet (4 mg total) by mouth every 8 (eight) hours as needed for nausea or vomiting. (Patient not  taking: Reported on 11/06/2022) 20 tablet 0   vitamin E 100 UNIT capsule Take by mouth daily.     No current facility-administered medications for this visit.    REVIEW OF SYSTEMS:    10 Point review of Systems was done is negative except as noted above.   PHYSICAL EXAMINATION:  .BP 132/64 (BP Location: Left Arm, Patient Position: Sitting)   Pulse 60   Temp 97.9 F (36.6 C) (Temporal)   Resp 15   Ht 5\' 3"  (1.6 m)   Wt 137 lb 6.4 oz (62.3 kg)   SpO2 100%   BMI 24.34 kg/m   GENERAL:alert, in no acute distress and comfortable SKIN: no acute rashes, no significant  lesions EYES: conjunctiva are pink and non-injected, sclera anicteric OROPHARYNX: MMM, no exudates, no oropharyngeal erythema or ulceration NECK: supple, no JVD LYMPH:  no palpable lymphadenopathy in the cervical, axillary or inguinal regions LUNGS: clear to auscultation b/l with normal respiratory effort HEART: regular rate & rhythm ABDOMEN:  normoactive bowel sounds , non tender, not distended. Extremity: no pedal edema PSYCH: alert & oriented x 3 with fluent speech NEURO: no focal motor/sensory deficits   LABORATORY DATA:  I have reviewed the data as listed  .    Latest Ref Rng & Units 06/21/2023    9:30 AM 04/26/2023   10:51 AM 02/22/2023   10:09 AM  CBC  WBC 4.0 - 10.5 K/uL 4.6  5.4  5.5   Hemoglobin 12.0 - 15.0 g/dL 16.1  09.6  04.5   Hematocrit 36.0 - 46.0 % 45.5  44.7  45.4   Platelets 150 - 400 K/uL 157  152  151    . CBC    Component Value Date/Time   WBC 4.6 06/21/2023 0930   WBC 6.7 03/02/2022 1738   RBC 4.81 06/21/2023 0930   HGB 15.4 (H) 06/21/2023 0930   HGB 14.0 03/22/2017 0950   HCT 45.5 06/21/2023 0930   HCT 41.9 03/22/2017 0950   PLT 157 06/21/2023 0930   PLT 136 (L) 03/22/2017 0950   MCV 94.6 06/21/2023 0930   MCV 93.3 03/22/2017 0950   MCH 32.0 06/21/2023 0930   MCHC 33.8 06/21/2023 0930   RDW 13.1 06/21/2023 0930   RDW 15.0 (H) 03/22/2017 0950   LYMPHSABS 1.5 06/21/2023 0930   LYMPHSABS 1.5 03/22/2017 0950   MONOABS 0.2 06/21/2023 0930   MONOABS 0.3 03/22/2017 0950   EOSABS 0.1 06/21/2023 0930   EOSABS 0.1 03/22/2017 0950   BASOSABS 0.0 06/21/2023 0930   BASOSABS 0.0 03/22/2017 0950    .    Latest Ref Rng & Units 06/21/2023    9:30 AM 04/26/2023   10:51 AM 02/22/2023   10:09 AM  CMP  Glucose 70 - 99 mg/dL 409  74  88   BUN 8 - 23 mg/dL 22  19  17    Creatinine 0.44 - 1.00 mg/dL 8.11  9.14  7.82   Sodium 135 - 145 mmol/L 139  142  142   Potassium 3.5 - 5.1 mmol/L 3.9  4.0  3.8   Chloride 98 - 111 mmol/L 104  106  107   CO2 22 - 32  mmol/L 27  29  27    Calcium 8.9 - 10.3 mg/dL 9.1  95.6  9.8   Total Protein 6.5 - 8.1 g/dL 6.7  7.1  6.9   Total Bilirubin 0.0 - 1.2 mg/dL 0.6  0.7  0.6   Alkaline Phos 38 - 126 U/L 95  90  82   AST 15 - 41 U/L 18  20  18    ALT 0 - 44 U/L 15  16  14     . Lab Results  Component Value Date   LDH 169 06/21/2023        Hematopathology Order10/03/2017 [facility]  BONE MARROW BIOPSY 01/25/2017: Interpretation Fourteen of the twenty metaphase cells analyzed are normal (46,XX). Six cells are abnormal and contain a deletion within the long arm of chromosome 7 and additional material of unknown origin attached to the long arm of chromosome 14 at or near the [facility] locus. Deletion of 7q is seen in mature B-cell neoplasms including marginal zone lymphoma. Ten cells were analyzed from the 24 hour unstimulated culture and ten cells were analyzed from the 72 hour culture stimulated with pokeweed mitogen, phorbol 12-myristate 13-acetate (PMA), and a CpG-oligodeoxynucleotide (ODN). The abnormal clone was only seen in the 72 hour stimulated culture.   Component Name Value Ref Range  Case Report Surgical Pathology Report                         Case: [id number]                                Authorizing Provider:  Vernie Murders,  Collected:           01/25/2017 1100                                    AGNP                                                                        Ordering Location:     [facility]      Received:            01/25/2017 1118                                    [place]                                                                 Pathologist:           Sondra Come,                                                                          MD  Specimens:   A) - Bone Marrow Right - Aspirate                                                                  B) - Bone  Marrow Right - Biopsy                                                                    C) - Peripheral Blood                                                                    Final Diagnosis Bone marrow, right iliac, aspiration and biopsy -  Hypercellular bone marrow (70%) with involvement by mature B-cell lymphoma with non-specific immunophenotype (representing approximately 20% of marrow cellularity by immunohistochemical analysis) (See Comment)  -  Cytogenetic studies are pending.     RADIOGRAPHIC STUDIES: I have personally reviewed the radiological images as listed and agreed with the findings in the report. No results found.  ASSESSMENT & PLAN:   73 y.o. female with   1) history of splenic marginal zone lymphoma. Presented with splenomegaly and cytopenia with constitutional sypmtoms fatigue and night sweats - now resolved with Rituxan treatment. HIV, hepatitis C and hepatitis B serologies unrevealing  SPEP shows no M spike and negative IFE.  BM [facility]x done at [facility] -  Hypercellular bone marrow (70%) with involvement by mature B-cell lymphoma with non-specific immunophenotype (representing approximately 20% of marrow cellularity by immunohistochemical analysis) Cytogenetics 7q deletion  2) Hot flashes grade 2  Patient declines consideration of medications for this.  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  Minimal and stable.  4) history of Rituxan related Rash - grade 1. Resolved and did not recur with dose 2 and 3 and 4 with adjusted premedications.  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 Patient notes that her sister had breast cancer in her 30s . Has not had genetic testing . No other family history of breast cancer ovarian cancer pancreatic cancer or melanoma . She has had recent left breast cysts that were aspirated in [place] and per her report were noted to be benign . -yrly MMG  with PCP (last done 12/31/2017) - negative   7) history of depression was previously on Lexapro currently not on any medications . Lost her husband 2009 to throat cancer .  PLAN:  -Discussed lab results on 06/21/23 in detail with patient. CBC normal, showed WBC of 4.6K, hemoglobin of 15.4, and platelets of 157K. -CMP normal -LDH normal -she has tolerated her Rituxan infusion with no new or severe toxicity issues -patient is appropriate to proceed with her third maintenance Rituxan infusion with same supportive medications. -will plan for 6 total  cycles of maintenance Rituxan infusions to try to achieve a deeper response and longer time to re-treatment. -discussed that her infusion schedule is open to adjustment by patient -will postpone her next infusion by one month from early may to early June due to patient's travel plans  -continue maintenance treatment every 2 months otherwise -will plan for a scan at the end of maintenance treatment  -answered all of patient's questions in detail   FOLLOW-UP: Plz schedule next maintenance Rituxan infusion with labs and MD in 90 days instead of 60 days (plan adjusted) per patients request. Subsequent treatments shall be every 60 days after that.  The total time spent in the appointment was 30 minutes* .  All of the patient's questions were answered with apparent satisfaction. The patient knows to call the clinic with any problems, questions or concerns.   Wyvonnia Lora MD MS AAHIVMS [facility] Hematology/Oncology Physician [facility]  .*Total Encounter Time as defined by the Centers for Medicare and Medicaid Services includes, in addition to the face-to-face time of a patient visit (documented in the note above) non-face-to-face time: obtaining and reviewing outside history, ordering and reviewing medications, tests or procedures, care coordination (communications with other health care professionals or caregivers) and documentation in  the medical record.    I,Mitra Faeizi,acting as a Neurosurgeon for Wyvonnia Lora, MD.,have documented all relevant documentation on the behalf of Wyvonnia Lora, MD,as directed by  Wyvonnia Lora, MD while in the presence of Wyvonnia Lora, MD.  .I have reviewed the above documentation for accuracy and completeness, and I agree with the above. Johney Maine MD

## 2023-06-24 ENCOUNTER — Telehealth: Payer: Self-pay | Admitting: Hematology

## 2023-06-24 NOTE — Telephone Encounter (Signed)
 Left patient a vm regarding upcoming appointment

## 2023-06-25 ENCOUNTER — Other Ambulatory Visit: Payer: Self-pay

## 2023-06-27 ENCOUNTER — Encounter: Payer: Self-pay | Admitting: Hematology

## 2023-07-04 DIAGNOSIS — Z0100 Encounter for examination of eyes and vision without abnormal findings: Secondary | ICD-10-CM | POA: Diagnosis not present

## 2023-08-20 ENCOUNTER — Ambulatory Visit: Payer: Medicare (Managed Care) | Admitting: Hematology

## 2023-08-20 ENCOUNTER — Ambulatory Visit: Payer: Medicare (Managed Care)

## 2023-08-20 ENCOUNTER — Other Ambulatory Visit: Payer: Medicare (Managed Care)

## 2023-09-17 ENCOUNTER — Other Ambulatory Visit: Payer: Self-pay

## 2023-09-17 DIAGNOSIS — C8307 Small cell B-cell lymphoma, spleen: Secondary | ICD-10-CM

## 2023-09-17 NOTE — Progress Notes (Signed)
 HEMATOLOGY/ONCOLOGY CLINIC VISIT NOTE  Date of Service: 09/18/2023  Patient Care Team: Ronna Coho, MD as PCP - General (Family Medicine)  CHIEF COMPLAINTS/PURPOSE OF CONSULTATION:   F/u for splenic marginal zone lymphoma  HISTORY OF PRESENTING ILLNESS:   Please see previous notes for details on initial presentation  INTERVAL HISTORY:  [name] is a 73 y.o. female here for continued evaluation and management of her splenic marginal zone lymphoma. Patient was last seen by me on 06/21/2023 and complained of persistent hot flashes and occasional abdominal soreness.   She reports that she enjoyed her time traveling to [place] recently.   Patient reports intermittent localized upper abdominal soreness, which generally worsens after eating. Her pain is not persistent. She denies any hx of hiatal hernia. Patient denies any issues with food getting stuck or reflux issues. She denies any injuries to the abdomen. Bowel movements does not improve abdominal soreness. She reports that her bowel habits have been regular.   Patient denies any fever, infection issues, or new lumps/bumps.   She reports no other new concerns since her last clinical visit.   MEDICAL HISTORY:   #1 Dyslipidemia - not on any medications #2 GERD #3 hot flashes menopausal status was previously on Climara patch #4 history of sebaceous cysts. #5 history of recent left breast cysts - aspirated by ultrasound guidance and noted to be negative for cancer. #6 family history of breast cancer in her sister when she was in her 30s. No genetic testing done at that time.  SURGICAL HISTORY:  #1 breast cysts 2 aspiration on the left side . #2 root canal treatments #3 sebaceous cyst excision   SOCIAL HISTORY: Social History   Socioeconomic History   Marital status: Widowed    Spouse name: Not on file   Number of children: Not on file   Years of education: Not on file   Highest education level: Not on file   Occupational History   Not on file  Tobacco Use   Smoking status: Never   Smokeless tobacco: Never  Vaping Use   Vaping status: Never Used  Substance and Sexual Activity   Alcohol use: Yes    Alcohol/week: 1.0 standard drink of alcohol    Types: 1 Glasses of wine per week    Comment: occasional   Drug use: No   Sexual activity: Not on file  Other Topics Concern   Not on file  Social History Narrative   Not on file   Social Drivers of Health   Financial Resource Strain: Not on file  Food Insecurity: No Food Insecurity (02/23/2022)   Received from [facility]   Hunger Vital Sign  Transportation Needs: Not on file  Physical Activity: Not on file  Stress: Not on file  Social Connections: Not on file  Intimate Partner Violence: Not on file    FAMILY HISTORY: Family History  Problem Relation Age of Onset   Breast cancer Sister 64   Breast cancer Maternal Aunt        diagnosed in her 60-70s    ALLERGIES:  is allergic to amoxicillin-pot clavulanate, medroxyprogesterone acetate, ruxience  [rituximab -pvvr], and codeine.  MEDICATIONS:  Current Outpatient Medications  Medication Sig Dispense Refill   cholecalciferol (VITAMIN D3) 25 MCG (1000 UNIT) tablet Take 1,000 Units by mouth daily.     ondansetron  (ZOFRAN ) 4 MG tablet Take 1 tablet (4 mg total) by mouth every 8 (eight) hours as needed for nausea or vomiting. (Patient not taking: Reported on  11/06/2022) 20 tablet 0   vitamin E 100 UNIT capsule Take by mouth daily.     No current facility-administered medications for this visit.    REVIEW OF SYSTEMS:    10 Point review of Systems was done is negative except as noted above.   PHYSICAL EXAMINATION:  .BP 128/66 (BP Location: Left Arm, Patient Position: Sitting)   Pulse 61   Temp 97.7 F (36.5 C) (Temporal)   Resp 18   Ht 5\' 3"  (1.6 m)   Wt 137 lb 11.2 oz (62.5 kg)   SpO2 97%   BMI 24.39 kg/m  GENERAL:alert, in no acute distress and comfortable SKIN: no acute  rashes, no significant lesions EYES: conjunctiva are pink and non-injected, sclera anicteric OROPHARYNX: MMM, no exudates, no oropharyngeal erythema or ulceration NECK: supple, no JVD LYMPH:  no palpable lymphadenopathy in the cervical, axillary or inguinal regions LUNGS: clear to auscultation b/l with normal respiratory effort HEART: regular rate & rhythm ABDOMEN:  normoactive bowel sounds , non tender, not distended. Extremity: no pedal edema PSYCH: alert & oriented x 3 with fluent speech NEURO: no focal motor/sensory deficits   LABORATORY DATA:  I have reviewed the data as listed  .    Latest Ref Rng & Units 06/21/2023    9:30 AM 04/26/2023   10:51 AM 02/22/2023   10:09 AM  CBC  WBC 4.0 - 10.5 K/uL 4.6  5.4  5.5   Hemoglobin 12.0 - 15.0 g/dL 82.9  56.2  13.0   Hematocrit 36.0 - 46.0 % 45.5  44.7  45.4   Platelets 150 - 400 K/uL 157  152  151    . CBC    Component Value Date/Time   WBC 4.6 06/21/2023 0930   WBC 6.7 03/02/2022 1738   RBC 4.81 06/21/2023 0930   HGB 15.4 (H) 06/21/2023 0930   HGB 14.0 03/22/2017 0950   HCT 45.5 06/21/2023 0930   HCT 41.9 03/22/2017 0950   PLT 157 06/21/2023 0930   PLT 136 (L) 03/22/2017 0950   MCV 94.6 06/21/2023 0930   MCV 93.3 03/22/2017 0950   MCH 32.0 06/21/2023 0930   MCHC 33.8 06/21/2023 0930   RDW 13.1 06/21/2023 0930   RDW 15.0 (H) 03/22/2017 0950   LYMPHSABS 1.5 06/21/2023 0930   LYMPHSABS 1.5 03/22/2017 0950   MONOABS 0.2 06/21/2023 0930   MONOABS 0.3 03/22/2017 0950   EOSABS 0.1 06/21/2023 0930   EOSABS 0.1 03/22/2017 0950   BASOSABS 0.0 06/21/2023 0930   BASOSABS 0.0 03/22/2017 0950    .    Latest Ref Rng & Units 06/21/2023    9:30 AM 04/26/2023   10:51 AM 02/22/2023   10:09 AM  CMP  Glucose 70 - 99 mg/dL 865  74  88   BUN 8 - 23 mg/dL 22  19  17    Creatinine 0.44 - 1.00 mg/dL 7.84  6.96  2.95   Sodium 135 - 145 mmol/L 139  142  142   Potassium 3.5 - 5.1 mmol/L 3.9  4.0  3.8   Chloride 98 - 111 mmol/L 104  106   107   CO2 22 - 32 mmol/L 27  29  27    Calcium 8.9 - 10.3 mg/dL 9.1  28.4  9.8   Total Protein 6.5 - 8.1 g/dL 6.7  7.1  6.9   Total Bilirubin 0.0 - 1.2 mg/dL 0.6  0.7  0.6   Alkaline Phos 38 - 126 U/L 95  90  82  AST 15 - 41 U/L 18  20  18    ALT 0 - 44 U/L 15  16  14     . Lab Results  Component Value Date   LDH 169 06/21/2023        Hematopathology Order10/03/2017 [facility]  BONE MARROW BIOPSY 01/25/2017: Interpretation Fourteen of the twenty metaphase cells analyzed are normal (46,XX). Six cells are abnormal and contain a deletion within the long arm of chromosome 7 and additional material of unknown origin attached to the long arm of chromosome 14 at or near the [facility] locus. Deletion of 7q is seen in mature B-cell neoplasms including marginal zone lymphoma. Ten cells were analyzed from the 24 hour unstimulated culture and ten cells were analyzed from the 72 hour culture stimulated with pokeweed mitogen, phorbol 12-myristate 13-acetate (PMA), and a CpG-oligodeoxynucleotide (ODN). The abnormal clone was only seen in the 72 hour stimulated culture.   Component Name Value Ref Range  Case Report Surgical Pathology Report                         Case: [id number]                                Authorizing Provider:  Marci Setter,  Collected:           01/25/2017 1100                                    AGNP                                                                        Ordering Location:     [facility]      Received:            01/25/2017 1118                                    [place]                                                                 Pathologist:           Alissa April,                                                                          MD  Specimens:   A) - Bone Marrow Right - Aspirate                                                                   B) - Bone Marrow Right - Biopsy                                                                    C) - Peripheral Blood                                                                    Final Diagnosis Bone marrow, right iliac, aspiration and biopsy -  Hypercellular bone marrow (70%) with involvement by mature B-cell lymphoma with non-specific immunophenotype (representing approximately 20% of marrow cellularity by immunohistochemical analysis) (See Comment)  -  Cytogenetic studies are pending.     RADIOGRAPHIC STUDIES: I have personally reviewed the radiological images as listed and agreed with the findings in the report. No results found.  ASSESSMENT & PLAN:   73 y.o. female with   1) history of splenic marginal zone lymphoma. Presented with splenomegaly and cytopenia with constitutional sypmtoms fatigue and night sweats - now resolved with Rituxan  treatment. HIV, hepatitis C and hepatitis B serologies unrevealing  SPEP shows no M spike and negative IFE.  BM [facility]x done at [facility] -  Hypercellular bone marrow (70%) with involvement by mature B-cell lymphoma with non-specific immunophenotype (representing approximately 20% of marrow cellularity by immunohistochemical analysis) Cytogenetics 7q deletion  2) Hot flashes grade 2  Patient declines consideration of medications for this.  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  Minimal and stable.  4) history of Rituxan  related Rash - grade 1. Resolved and did not recur with dose 2 and 3 and 4 with adjusted premedications.  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 Patient notes that her sister had breast cancer in her 30s . Has not had genetic testing . No other family history of breast cancer ovarian cancer pancreatic cancer or melanoma . She has had recent left breast cysts that were aspirated in [place] and per her report were noted to be benign  . -yrly MMG with PCP (last done 12/31/2017) - negative   7) history of depression was previously on Lexapro currently not on any medications . Lost her husband 2009 to throat cancer .  PLAN:  -Discussed lab results on 09/18/2023 in detail with patient. CBC normal, showed WBC of 5.7K, hemoglobin of 15.6, and platelets of 153K. -CMP normal -she tolerated her last Rituxan  infusion with no new or severe toxicity issues -patient is appropriate to proceed with her fourth maintenance Rituxan  treatment today with same supportive medications -continue maintenance treatment every 2 months -  will plan for 6 total cycles of maintenance Rituxan  infusions  -did not feel an enlarged spleen during physical examination -discussed that it is possible that she may have a very small ventral hernia -we will plan to US  the spleen at the end of her maintenance treatment  FOLLOW-UP: Please schedule next 2 doses of maintenance Rituxan  every 60 days with labs and MD visits  The total time spent in the appointment was 30 minutes* .  All of the patient's questions were answered with apparent satisfaction. The patient knows to call the clinic with any problems, questions or concerns.   Jacquelyn Matt MD MS AAHIVMS [facility] Hematology/Oncology Physician [facility]  .*Total Encounter Time as defined by the Centers for Medicare and Medicaid Services includes, in addition to the face-to-face time of a patient visit (documented in the note above) non-face-to-face time: obtaining and reviewing outside history, ordering and reviewing medications, tests or procedures, care coordination (communications with other health care professionals or caregivers) and documentation in the medical record.    I,Mitra Faeizi,acting as a Neurosurgeon for Jacquelyn Matt, MD.,have documented all relevant documentation on the behalf of Jacquelyn Matt, MD,as directed by  Jacquelyn Matt, MD while in the presence of Jacquelyn Matt, MD.  .I have  reviewed the above documentation for accuracy and completeness, and I agree with the above. .Heyward Douthit Kishore Laverda Stribling MD

## 2023-09-18 ENCOUNTER — Inpatient Hospital Stay: Payer: Medicare (Managed Care) | Attending: Hematology

## 2023-09-18 ENCOUNTER — Inpatient Hospital Stay: Payer: Medicare (Managed Care) | Admitting: Hematology

## 2023-09-18 ENCOUNTER — Inpatient Hospital Stay: Payer: Medicare (Managed Care)

## 2023-09-18 VITALS — BP 109/52 | HR 63 | Temp 98.4°F | Resp 16

## 2023-09-18 VITALS — BP 128/66 | HR 61 | Temp 97.7°F | Resp 18 | Ht 63.0 in | Wt 137.7 lb

## 2023-09-18 DIAGNOSIS — Z5112 Encounter for antineoplastic immunotherapy: Secondary | ICD-10-CM | POA: Insufficient documentation

## 2023-09-18 DIAGNOSIS — D696 Thrombocytopenia, unspecified: Secondary | ICD-10-CM | POA: Diagnosis not present

## 2023-09-18 DIAGNOSIS — C8307 Small cell B-cell lymphoma, spleen: Secondary | ICD-10-CM | POA: Insufficient documentation

## 2023-09-18 DIAGNOSIS — Z7189 Other specified counseling: Secondary | ICD-10-CM

## 2023-09-18 LAB — CBC WITH DIFFERENTIAL (CANCER CENTER ONLY)
Abs Immature Granulocytes: 0.01 10*3/uL (ref 0.00–0.07)
Basophils Absolute: 0 10*3/uL (ref 0.0–0.1)
Basophils Relative: 1 %
Eosinophils Absolute: 0.3 10*3/uL (ref 0.0–0.5)
Eosinophils Relative: 6 %
HCT: 45.1 % (ref 36.0–46.0)
Hemoglobin: 15.6 g/dL — ABNORMAL HIGH (ref 12.0–15.0)
Immature Granulocytes: 0 %
Lymphocytes Relative: 31 %
Lymphs Abs: 1.8 10*3/uL (ref 0.7–4.0)
MCH: 31.8 pg (ref 26.0–34.0)
MCHC: 34.6 g/dL (ref 30.0–36.0)
MCV: 92 fL (ref 80.0–100.0)
Monocytes Absolute: 0.4 10*3/uL (ref 0.1–1.0)
Monocytes Relative: 6 %
Neutro Abs: 3.2 10*3/uL (ref 1.7–7.7)
Neutrophils Relative %: 56 %
Platelet Count: 153 10*3/uL (ref 150–400)
RBC: 4.9 MIL/uL (ref 3.87–5.11)
RDW: 13.3 % (ref 11.5–15.5)
WBC Count: 5.7 10*3/uL (ref 4.0–10.5)
nRBC: 0 % (ref 0.0–0.2)

## 2023-09-18 LAB — CMP (CANCER CENTER ONLY)
ALT: 21 U/L (ref 0–44)
AST: 20 U/L (ref 15–41)
Albumin: 4.8 g/dL (ref 3.5–5.0)
Alkaline Phosphatase: 91 U/L (ref 38–126)
Anion gap: 9 (ref 5–15)
BUN: 17 mg/dL (ref 8–23)
CO2: 26 mmol/L (ref 22–32)
Calcium: 9.7 mg/dL (ref 8.9–10.3)
Chloride: 106 mmol/L (ref 98–111)
Creatinine: 0.83 mg/dL (ref 0.44–1.00)
GFR, Estimated: >60 mL/min (ref >60)
Glucose, Bld: 87 mg/dL (ref 70–99)
Potassium: 3.7 mmol/L (ref 3.5–5.1)
Sodium: 141 mmol/L (ref 135–145)
Total Bilirubin: 0.7 mg/dL (ref 0.0–1.2)
Total Protein: 7.2 g/dL (ref 6.5–8.1)

## 2023-09-18 LAB — LACTATE DEHYDROGENASE: LDH: 176 U/L (ref 98–192)

## 2023-09-18 MED ORDER — CETIRIZINE HCL 10 MG/ML IV SOLN
10.0000 mg | Freq: Once | INTRAVENOUS | Status: AC
  Administered 2023-09-18: 10 mg via INTRAVENOUS
  Filled 2023-09-18: qty 1

## 2023-09-18 MED ORDER — ACETAMINOPHEN 325 MG PO TABS
650.0000 mg | ORAL_TABLET | Freq: Once | ORAL | Status: AC
  Administered 2023-09-18: 650 mg via ORAL
  Filled 2023-09-18: qty 2

## 2023-09-18 MED ORDER — SODIUM CHLORIDE 0.9 % IV SOLN: Freq: Once | INTRAVENOUS | Status: AC

## 2023-09-18 MED ORDER — FAMOTIDINE IN NACL 20-0.9 MG/50ML-% IV SOLN
20.0000 mg | Freq: Once | INTRAVENOUS | Status: AC
  Administered 2023-09-18: 20 mg via INTRAVENOUS
  Filled 2023-09-18: qty 50

## 2023-09-18 MED ORDER — METHYLPREDNISOLONE SODIUM SUCC 125 MG IJ SOLR
125.0000 mg | Freq: Once | INTRAMUSCULAR | Status: AC
  Administered 2023-09-18: 125 mg via INTRAVENOUS
  Filled 2023-09-18: qty 2

## 2023-09-18 MED ORDER — SODIUM CHLORIDE 0.9 % IV SOLN
375.0000 mg/m2 | Freq: Once | INTRAVENOUS | Status: AC
  Administered 2023-09-18: 600 mg via INTRAVENOUS
  Filled 2023-09-18: qty 50

## 2023-09-18 NOTE — Patient Instructions (Signed)
 [facility] A [facility]  Discharge Instructions: Thank you for choosing [facility] to provide your oncology and hematology care.   If you have a lab appointment with the Cancer Center, please go directly to the Cancer Center and check in at the registration area.   Wear comfortable clothing and clothing appropriate for easy access to any Portacath or PICC line.   We strive to give you quality time with your provider. You may need to reschedule your appointment if you arrive late (15 or more minutes).  Arriving late affects you and other patients whose appointments are after yours.  Also, if you miss three or more appointments without notifying the office, you may be dismissed from the clinic at the provider's discretion.      For prescription refill requests, have your pharmacy contact our office and allow 72 hours for refills to be completed.    Today you received the following chemotherapy and/or immunotherapy agents: rituximab      To help prevent nausea and vomiting after your treatment, we encourage you to take your nausea medication as directed.  BELOW ARE SYMPTOMS THAT SHOULD BE REPORTED IMMEDIATELY: *FEVER GREATER THAN 100.4 F (38 C) OR HIGHER *CHILLS OR SWEATING *NAUSEA AND VOMITING THAT IS NOT CONTROLLED WITH YOUR NAUSEA MEDICATION *UNUSUAL SHORTNESS OF BREATH *UNUSUAL BRUISING OR BLEEDING *URINARY PROBLEMS (pain or burning when urinating, or frequent urination) *BOWEL PROBLEMS (unusual diarrhea, constipation, pain near the anus) TENDERNESS IN MOUTH AND THROAT WITH OR WITHOUT PRESENCE OF ULCERS (sore throat, sores in mouth, or a toothache) UNUSUAL RASH, SWELLING OR PAIN  UNUSUAL VAGINAL DISCHARGE OR ITCHING   Items with * indicate a potential emergency and should be followed up as soon as possible or go to the Emergency Department if any problems should occur.  Please show the CHEMOTHERAPY ALERT CARD or IMMUNOTHERAPY  ALERT CARD at check-in to the Emergency Department and triage nurse.  Should you have questions after your visit or need to cancel or reschedule your appointment, please contact [facility]  Dept: [phone]  and follow the prompts.  Office hours are 8:00 a.m. to 4:30 p.m. Monday - Friday. Please note that voicemails left after 4:00 p.m. may not be returned until the following business day.  We are closed weekends and major holidays. You have access to a nurse at all times for urgent questions. Please call the main number to the clinic Dept: [phone] and follow the prompts.   For any non-urgent questions, you may also contact your provider using MyChart. We now offer e-Visits for anyone 60 and older to request care online for non-urgent symptoms. For details visit [email or web address].   Also download the MyChart app! Go to the app store, search "MyChart", open the app, select , and log in with your MyChart username and password.

## 2023-09-19 ENCOUNTER — Other Ambulatory Visit: Payer: Self-pay

## 2023-09-24 ENCOUNTER — Encounter: Payer: Self-pay | Admitting: Hematology

## 2023-10-01 ENCOUNTER — Other Ambulatory Visit: Payer: Self-pay

## 2023-10-28 ENCOUNTER — Other Ambulatory Visit: Payer: Self-pay | Admitting: Family Medicine

## 2023-10-28 DIAGNOSIS — Z1231 Encounter for screening mammogram for malignant neoplasm of breast: Secondary | ICD-10-CM

## 2023-10-29 ENCOUNTER — Other Ambulatory Visit: Payer: Self-pay

## 2023-11-19 ENCOUNTER — Other Ambulatory Visit: Payer: Self-pay

## 2023-11-22 ENCOUNTER — Inpatient Hospital Stay: Payer: Medicare (Managed Care) | Attending: Hematology

## 2023-11-22 ENCOUNTER — Inpatient Hospital Stay: Payer: Medicare (Managed Care)

## 2023-11-22 ENCOUNTER — Inpatient Hospital Stay: Payer: Medicare (Managed Care) | Admitting: Hematology

## 2023-11-22 VITALS — BP 107/51 | HR 62 | Temp 98.3°F | Resp 19

## 2023-11-22 VITALS — BP 131/60 | HR 46 | Temp 97.3°F | Resp 20 | Wt 138.1 lb

## 2023-11-22 DIAGNOSIS — C8307 Small cell B-cell lymphoma, spleen: Secondary | ICD-10-CM

## 2023-11-22 DIAGNOSIS — Z5112 Encounter for antineoplastic immunotherapy: Secondary | ICD-10-CM

## 2023-11-22 DIAGNOSIS — Z79620 Long term (current) use of immunosuppressive biologic: Secondary | ICD-10-CM | POA: Diagnosis not present

## 2023-11-22 DIAGNOSIS — Z7189 Other specified counseling: Secondary | ICD-10-CM

## 2023-11-22 LAB — CBC WITH DIFFERENTIAL (CANCER CENTER ONLY)
Abs Immature Granulocytes: 0.01 K/uL (ref 0.00–0.07)
Basophils Absolute: 0 K/uL (ref 0.0–0.1)
Basophils Relative: 0 %
Eosinophils Absolute: 0.3 K/uL (ref 0.0–0.5)
Eosinophils Relative: 5 %
HCT: 44.8 % (ref 36.0–46.0)
Hemoglobin: 15.2 g/dL — ABNORMAL HIGH (ref 12.0–15.0)
Immature Granulocytes: 0 %
Lymphocytes Relative: 34 %
Lymphs Abs: 1.9 K/uL (ref 0.7–4.0)
MCH: 31.7 pg (ref 26.0–34.0)
MCHC: 33.9 g/dL (ref 30.0–36.0)
MCV: 93.5 fL (ref 80.0–100.0)
Monocytes Absolute: 0.3 K/uL (ref 0.1–1.0)
Monocytes Relative: 5 %
Neutro Abs: 3.1 K/uL (ref 1.7–7.7)
Neutrophils Relative %: 56 %
Platelet Count: 151 K/uL (ref 150–400)
RBC: 4.79 MIL/uL (ref 3.87–5.11)
RDW: 12.9 % (ref 11.5–15.5)
WBC Count: 5.6 K/uL (ref 4.0–10.5)
nRBC: 0 % (ref 0.0–0.2)

## 2023-11-22 LAB — CMP (CANCER CENTER ONLY)
ALT: 18 U/L (ref 0–44)
AST: 19 U/L (ref 15–41)
Albumin: 4.5 g/dL (ref 3.5–5.0)
Alkaline Phosphatase: 82 U/L (ref 38–126)
Anion gap: 7 (ref 5–15)
BUN: 14 mg/dL (ref 8–23)
CO2: 27 mmol/L (ref 22–32)
Calcium: 9.2 mg/dL (ref 8.9–10.3)
Chloride: 108 mmol/L (ref 98–111)
Creatinine: 0.81 mg/dL (ref 0.44–1.00)
GFR, Estimated: >60 mL/min (ref >60)
Glucose, Bld: 120 mg/dL — ABNORMAL HIGH (ref 70–99)
Potassium: 3.8 mmol/L (ref 3.5–5.1)
Sodium: 142 mmol/L (ref 135–145)
Total Bilirubin: 0.7 mg/dL (ref 0.0–1.2)
Total Protein: 6.5 g/dL (ref 6.5–8.1)

## 2023-11-22 LAB — LACTATE DEHYDROGENASE: LDH: 155 U/L (ref 98–192)

## 2023-11-22 MED ORDER — SODIUM CHLORIDE 0.9 % IV SOLN: Freq: Once | INTRAVENOUS | Status: AC

## 2023-11-22 MED ORDER — SODIUM CHLORIDE 0.9% FLUSH
10.0000 mL | INTRAVENOUS | Status: DC | PRN
Start: 2023-11-22 — End: 2023-11-22

## 2023-11-22 MED ORDER — CETIRIZINE HCL 10 MG/ML IV SOLN
10.0000 mg | Freq: Once | INTRAVENOUS | Status: AC
  Administered 2023-11-22: 10 mg via INTRAVENOUS
  Filled 2023-11-22: qty 1

## 2023-11-22 MED ORDER — METHYLPREDNISOLONE SODIUM SUCC 125 MG IJ SOLR
125.0000 mg | Freq: Once | INTRAMUSCULAR | Status: AC
  Administered 2023-11-22: 125 mg via INTRAVENOUS
  Filled 2023-11-22: qty 2

## 2023-11-22 MED ORDER — SODIUM CHLORIDE 0.9 % IV SOLN
375.0000 mg/m2 | Freq: Once | INTRAVENOUS | Status: AC
  Administered 2023-11-22: 600 mg via INTRAVENOUS
  Filled 2023-11-22: qty 10

## 2023-11-22 MED ORDER — ACETAMINOPHEN 325 MG PO TABS
650.0000 mg | ORAL_TABLET | Freq: Once | ORAL | Status: AC
  Administered 2023-11-22: 650 mg via ORAL
  Filled 2023-11-22: qty 2

## 2023-11-22 MED ORDER — FAMOTIDINE IN NACL 20-0.9 MG/50ML-% IV SOLN
20.0000 mg | Freq: Once | INTRAVENOUS | Status: AC
  Administered 2023-11-22: 20 mg via INTRAVENOUS
  Filled 2023-11-22: qty 50

## 2023-11-22 NOTE — Patient Instructions (Signed)
 [facility] A DEPT OF [facility]  Discharge Instructions: Thank you for choosing [facility] to provide your oncology and hematology care.   If you have a lab appointment with the Cancer Center, please go directly to the Cancer Center and check in at the registration area.   Wear comfortable clothing and clothing appropriate for easy access to any Portacath or PICC line.   We strive to give you quality time with your provider. You may need to reschedule your appointment if you arrive late (15 or more minutes).  Arriving late affects you and other patients whose appointments are after yours.  Also, if you miss three or more appointments without notifying the office, you may be dismissed from the clinic at the provider's discretion.      For prescription refill requests, have your pharmacy contact our office and allow 72 hours for refills to be completed.    Today you received the following chemotherapy and/or immunotherapy agents: Ruxience      To help prevent nausea and vomiting after your treatment, we encourage you to take your nausea medication as directed.  BELOW ARE SYMPTOMS THAT SHOULD BE REPORTED IMMEDIATELY: *FEVER GREATER THAN 100.4 F (38 C) OR HIGHER *CHILLS OR SWEATING *NAUSEA AND VOMITING THAT IS NOT CONTROLLED WITH YOUR NAUSEA MEDICATION *UNUSUAL SHORTNESS OF BREATH *UNUSUAL BRUISING OR BLEEDING *URINARY PROBLEMS (pain or burning when urinating, or frequent urination) *BOWEL PROBLEMS (unusual diarrhea, constipation, pain near the anus) TENDERNESS IN MOUTH AND THROAT WITH OR WITHOUT PRESENCE OF ULCERS (sore throat, sores in mouth, or a toothache) UNUSUAL RASH, SWELLING OR PAIN  UNUSUAL VAGINAL DISCHARGE OR ITCHING   Items with * indicate a potential emergency and should be followed up as soon as possible or go to the Emergency Department if any problems should occur.  Please show the CHEMOTHERAPY ALERT CARD or IMMUNOTHERAPY  ALERT CARD at check-in to the Emergency Department and triage nurse.  Should you have questions after your visit or need to cancel or reschedule your appointment, please contact [facility]  Dept: [phone]  and follow the prompts.  Office hours are 8:00 a.m. to 4:30 p.m. Monday - Friday. Please note that voicemails left after 4:00 p.m. may not be returned until the following business day.  We are closed weekends and major holidays. You have access to a nurse at all times for urgent questions. Please call the main number to the clinic Dept: [phone] and follow the prompts.   For any non-urgent questions, you may also contact your provider using MyChart. We now offer e-Visits for anyone 88 and older to request care online for non-urgent symptoms. For details visit [email or web address].   Also download the MyChart app! Go to the app store, search "MyChart", open the app, select [place], and log in with your MyChart username and password.

## 2023-11-27 ENCOUNTER — Other Ambulatory Visit: Payer: Self-pay

## 2023-11-28 ENCOUNTER — Encounter: Payer: Self-pay | Admitting: Hematology

## 2023-11-28 NOTE — Progress Notes (Signed)
 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 is a 73 y.o. female who is here for continued evaluation and management of her splenic marginal zone lymphoma and her next cycle of maintenance rituximab  treatment.  She notes no acute new symptoms since her last clinic visit.  No fevers no chills no night sweats.  No unexpected weight loss.  No new infection issues. No new abdominal pain or distention.  No change in bowel habits or urination.. Patient does not note any significant reactions from her last treatment. Continues to have some intermittent hot flashes.  MEDICAL HISTORY:   #1 Dyslipidemia - not on any medications #2 GERD #3 hot flashes menopausal status was previously on Climara patch #4 history of sebaceous cysts. #5 history of recent left breast cysts - aspirated by ultrasound guidance and noted to be negative for cancer. #6 family history of breast cancer in her sister when she was in her 30s. No genetic testing done at that time.  SURGICAL HISTORY:  #1 breast cysts 2 aspiration on the left side . #2 root canal treatments #3 sebaceous cyst excision   SOCIAL HISTORY: Social History   Socioeconomic History   Marital status: Widowed    Spouse name: Not on file   Number of children: Not on file   Years of education: Not on file   Highest education level: Not on file  Occupational History   Not on file  Tobacco Use   Smoking status: Never   Smokeless tobacco: Never  Vaping Use   Vaping status: Never Used  Substance and Sexual Activity   Alcohol use: Yes    Alcohol/week: 1.0 standard drink of alcohol     Types: 1 Glasses of wine per week    Comment: occasional   Drug use: No   Sexual activity: Not on file  Other Topics Concern   Not on file  Social History Narrative   Not on file   Social Drivers of Health   Financial Resource Strain: Not on file  Food Insecurity: No Food Insecurity (02/23/2022)   Received from [facility]   Hunger Vital Sign  Transportation Needs: Not on file  Physical Activity: Not on file  Stress: Not on file  Social Connections: Not on file  Intimate Partner Violence: Not on file    FAMILY HISTORY: Family History  Problem Relation Age of Onset   Breast cancer Sister 98   Breast cancer Maternal Aunt        diagnosed in her 60-70s    ALLERGIES:  is allergic to amoxicillin-pot clavulanate, medroxyprogesterone acetate, ruxience  [rituximab -pvvr], and codeine.  MEDICATIONS:  Current Outpatient Medications  Medication Sig Dispense Refill   cholecalciferol (VITAMIN D3) 25 MCG (1000 UNIT) tablet Take 1,000 Units by mouth daily.     vitamin E 100 UNIT capsule Take by mouth daily.     No current facility-administered medications for this visit.    REVIEW OF SYSTEMS:   10 Point review of Systems was done is negative except as noted above.  PHYSICAL EXAMINATION:  .BP 131/60   Pulse (!) 46   Temp (!) 97.3 F (36.3 C)   Resp 20   Wt  138 lb 1.6 oz (62.6 kg)   SpO2 100%   BMI 24.46 kg/m  NAD GENERAL:alert, in no acute distress and comfortable SKIN: no acute rashes, no significant lesions EYES: conjunctiva are pink and non-injected, sclera anicteric OROPHARYNX: MMM, no exudates, no oropharyngeal erythema or ulceration NECK: supple, no JVD LYMPH:  no palpable lymphadenopathy in the cervical, axillary or inguinal regions LUNGS: clear to auscultation b/l with normal respiratory effort HEART: regular rate & rhythm ABDOMEN:  normoactive bowel sounds , non tender, not distended.  No palpable hepatosplenomegaly Extremity: no pedal edema PSYCH: alert &  oriented x 3 with fluent speech NEURO: no focal motor/sensory deficits   LABORATORY DATA:  I have reviewed the data as listed  .    Latest Ref Rng & Units 11/22/2023    8:10 AM 09/18/2023    8:55 AM 06/21/2023    9:30 AM  CBC  WBC 4.0 - 10.5 K/uL 5.6  5.7  4.6   Hemoglobin 12.0 - 15.0 g/dL 84.7  84.3  84.5   Hematocrit 36.0 - 46.0 % 44.8  45.1  45.5   Platelets 150 - 400 K/uL 151  153  157    . CBC    Component Value Date/Time   WBC 5.6 11/22/2023 0810   WBC 6.7 03/02/2022 1738   RBC 4.79 11/22/2023 0810   HGB 15.2 (H) 11/22/2023 0810   HGB 14.0 03/22/2017 0950   HCT 44.8 11/22/2023 0810   HCT 41.9 03/22/2017 0950   PLT 151 11/22/2023 0810   PLT 136 (L) 03/22/2017 0950   MCV 93.5 11/22/2023 0810   MCV 93.3 03/22/2017 0950   MCH 31.7 11/22/2023 0810   MCHC 33.9 11/22/2023 0810   RDW 12.9 11/22/2023 0810   RDW 15.0 (H) 03/22/2017 0950   LYMPHSABS 1.9 11/22/2023 0810   LYMPHSABS 1.5 03/22/2017 0950   MONOABS 0.3 11/22/2023 0810   MONOABS 0.3 03/22/2017 0950   EOSABS 0.3 11/22/2023 0810   EOSABS 0.1 03/22/2017 0950   BASOSABS 0.0 11/22/2023 0810   BASOSABS 0.0 03/22/2017 0950    .    Latest Ref Rng & Units 11/22/2023    8:10 AM 09/18/2023    8:55 AM 06/21/2023    9:30 AM  CMP  Glucose 70 - 99 mg/dL 879  87  877   BUN 8 - 23 mg/dL 14  17  22    Creatinine 0.44 - 1.00 mg/dL 9.18  9.16  9.20   Sodium 135 - 145 mmol/L 142  141  139   Potassium 3.5 - 5.1 mmol/L 3.8  3.7  3.9   Chloride 98 - 111 mmol/L 108  106  104   CO2 22 - 32 mmol/L 27  26  27    Calcium 8.9 - 10.3 mg/dL 9.2  9.7  9.1   Total Protein 6.5 - 8.1 g/dL 6.5  7.2  6.7   Total Bilirubin 0.0 - 1.2 mg/dL 0.7  0.7  0.6   Alkaline Phos 38 - 126 U/L 82  91  95   AST 15 - 41 U/L 19  20  18    ALT 0 - 44 U/L 18  21  15     . Lab Results  Component Value Date   LDH 155 11/22/2023        Hematopathology Order10/03/2017 [facility]  BONE MARROW BIOPSY 01/25/2017: Interpretation Fourteen of the twenty  metaphase cells analyzed are normal (46,XX). Six cells are abnormal and contain a deletion within the long arm of chromosome  7 and additional material of unknown origin attached to the long arm of chromosome 14 at or near the [facility] locus. Deletion of 7q is seen in mature B-cell neoplasms including marginal zone lymphoma. Ten cells were analyzed from the 24 hour unstimulated culture and ten cells were analyzed from the 72 hour culture stimulated with pokeweed mitogen, phorbol 12-myristate 13-acetate (PMA), and a CpG-oligodeoxynucleotide (ODN). The abnormal clone was only seen in the 72 hour stimulated culture.   Component Name Value Ref Range  Case Report Surgical Pathology Report                         Case: [id number]                                Authorizing Provider:  Asberry Nemiah Adjutant,  Collected:           01/25/2017 1100                                    AGNP                                                                        Ordering Location:     [facility]      Received:            01/25/2017 1118                                    [place]                                                                 Pathologist:           Corean Zulema Hoots,                                                                          MD                                                                          Specimens:   A) - Bone Marrow Right - Aspirate  B) - Bone Marrow Right - Biopsy                                                                    C) - Peripheral Blood                                                                    Final Diagnosis Bone marrow, right iliac, aspiration and biopsy -  Hypercellular bone marrow (70%) with involvement by mature B-cell lymphoma with non-specific immunophenotype (representing approximately 20% of marrow cellularity by immunohistochemical analysis) (See Comment)  -   Cytogenetic studies are pending.     RADIOGRAPHIC STUDIES: I have personally reviewed the radiological images as listed and agreed with the findings in the report. No results found.  ASSESSMENT & PLAN:   73 y.o. female with   1) history of splenic marginal zone lymphoma. Presented with splenomegaly and cytopenia with constitutional sypmtoms fatigue and night sweats - now resolved with Rituxan  treatment. HIV, hepatitis C and hepatitis B serologies unrevealing  SPEP shows no M spike and negative IFE.  BM [facility]x [facility] -  Hypercellular bone marrow (70%) with involvement by mature B-cell lymphoma with non-specific immunophenotype (representing approximately 20% of marrow cellularity by immunohistochemical analysis) Cytogenetics 7q deletion  2) Hot flashes grade 2  Patient declines consideration of medications for this.  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  Minimal and stable.  4) history of Rituxan  related Rash - grade 1. Resolved and did not recur with dose 2 and 3 and 4 with adjusted premedications.  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 Patient notes that her sister had breast cancer in her 30s . Has not had genetic testing . No other family history of breast cancer ovarian cancer pancreatic cancer or melanoma . She has had recent left breast cysts that were aspirated in [place] and per her report were noted to be benign . -yrly MMG with PCP (last done 12/31/2017) - negative   7) history of depression was previously on Lexapro currently not on any medications . Lost her husband 2009 to throat cancer .  PLAN:  -Discussed lab results on 11/22/2023 in details CBC is within normal limits with normal hemoglobin WBC count and platelets.  No lymphocytosis. CMP stable. LDH is within normal limits. Patient notes no labs or clinical signs or symptoms suggestive of progression of her splenic  marginal zone lymphoma. No notably palpable splenomegaly on clinical examination. She has 1 more the sixth dose of maintenance Rituxan  after which we will repeat CT of the abdomen. - She was recommended to stay up to speed with COVID vaccinations and repeat a vaccine if not done in the last 6 months.  She is also to follow-up with her primary care physician for standing up to speed with her age-appropriate vaccinations COVID, flu, pneumonia vaccines, Shingrix etc. - Follow-up with PCP for age-appropriate cancer screening  FOLLOW-UP:  Please schedule next dose of maintenance Rituxan  every 60 days with labs and MD visits   The total time spent in the appointment was 30 minutes*.  All of the patient's questions were answered with apparent satisfaction. The patient knows to call the clinic with any problems, questions or concerns.   Emaline Saran MD MS AAHIVMS [facility] Hematology/Oncology Physician [facility]  .*Total Encounter Time as defined by the Centers for Medicare and Medicaid Services includes, in addition to the face-to-face time of a patient visit (documented in the note above) non-face-to-face time: obtaining and reviewing outside history, ordering and reviewing medications, tests or procedures, care coordination (communications with other health care professionals or caregivers) and documentation in the medical record.

## 2023-12-02 ENCOUNTER — Ambulatory Visit: Payer: Medicare (Managed Care)

## 2023-12-26 ENCOUNTER — Ambulatory Visit
Admission: RE | Admit: 2023-12-26 | Discharge: 2023-12-26 | Disposition: A | Discharge status: Home / Self Care | Payer: Medicare (Managed Care) | Source: Ambulatory Visit | Attending: Family Medicine | Admitting: Family Medicine

## 2023-12-26 DIAGNOSIS — Z1231 Encounter for screening mammogram for malignant neoplasm of breast: Secondary | ICD-10-CM | POA: Diagnosis not present

## 2023-12-31 ENCOUNTER — Other Ambulatory Visit: Payer: Self-pay | Admitting: Family Medicine

## 2023-12-31 DIAGNOSIS — R928 Other abnormal and inconclusive findings on diagnostic imaging of breast: Secondary | ICD-10-CM

## 2024-01-10 ENCOUNTER — Ambulatory Visit
Admission: RE | Admit: 2024-01-10 | Discharge: 2024-01-10 | Disposition: A | Discharge status: Home / Self Care | Payer: Medicare (Managed Care) | Source: Ambulatory Visit | Attending: Family Medicine | Admitting: Family Medicine

## 2024-01-10 ENCOUNTER — Other Ambulatory Visit: Payer: Self-pay | Admitting: Family Medicine

## 2024-01-10 ENCOUNTER — Other Ambulatory Visit: Payer: Self-pay

## 2024-01-10 ENCOUNTER — Encounter: Payer: Self-pay | Admitting: Hematology

## 2024-01-10 DIAGNOSIS — N6001 Solitary cyst of right breast: Secondary | ICD-10-CM | POA: Diagnosis not present

## 2024-01-10 DIAGNOSIS — N6313 Unspecified lump in the right breast, lower outer quadrant: Secondary | ICD-10-CM

## 2024-01-10 DIAGNOSIS — R921 Mammographic calcification found on diagnostic imaging of breast: Secondary | ICD-10-CM | POA: Diagnosis not present

## 2024-01-10 DIAGNOSIS — N6314 Unspecified lump in the right breast, lower inner quadrant: Secondary | ICD-10-CM

## 2024-01-10 DIAGNOSIS — R928 Other abnormal and inconclusive findings on diagnostic imaging of breast: Secondary | ICD-10-CM

## 2024-01-17 ENCOUNTER — Ambulatory Visit: Payer: Medicare (Managed Care)

## 2024-01-17 ENCOUNTER — Other Ambulatory Visit: Payer: Medicare (Managed Care)

## 2024-01-17 ENCOUNTER — Ambulatory Visit: Payer: Medicare (Managed Care) | Admitting: Hematology

## 2024-01-23 ENCOUNTER — Other Ambulatory Visit: Payer: Self-pay

## 2024-01-23 DIAGNOSIS — C8307 Small cell B-cell lymphoma, spleen: Secondary | ICD-10-CM

## 2024-01-24 ENCOUNTER — Inpatient Hospital Stay: Payer: Medicare (Managed Care) | Attending: Hematology

## 2024-01-24 ENCOUNTER — Inpatient Hospital Stay: Payer: Medicare (Managed Care) | Admitting: Hematology

## 2024-01-24 ENCOUNTER — Inpatient Hospital Stay: Payer: Medicare (Managed Care)

## 2024-01-24 VITALS — BP 118/76 | HR 63 | Temp 97.0°F | Resp 17 | Ht 63.0 in | Wt 139.0 lb

## 2024-01-24 DIAGNOSIS — C830A Small cell b-cell lymphoma, in remission: Secondary | ICD-10-CM | POA: Diagnosis present

## 2024-01-24 DIAGNOSIS — C8307 Small cell B-cell lymphoma, spleen: Secondary | ICD-10-CM | POA: Diagnosis not present

## 2024-01-24 LAB — CMP (CANCER CENTER ONLY)
ALT: 14 U/L (ref 0–44)
AST: 16 U/L (ref 15–41)
Albumin: 4.6 g/dL (ref 3.5–5.0)
Alkaline Phosphatase: 76 U/L (ref 38–126)
Anion gap: 8 (ref 5–15)
BUN: 17 mg/dL (ref 8–23)
CO2: 26 mmol/L (ref 22–32)
Calcium: 9.8 mg/dL (ref 8.9–10.3)
Chloride: 109 mmol/L (ref 98–111)
Creatinine: 0.9 mg/dL (ref 0.44–1.00)
GFR, Estimated: >60 mL/min (ref >60)
Glucose, Bld: 80 mg/dL (ref 70–99)
Potassium: 3.9 mmol/L (ref 3.5–5.1)
Sodium: 143 mmol/L (ref 135–145)
Total Bilirubin: 0.6 mg/dL (ref 0.0–1.2)
Total Protein: 6.8 g/dL (ref 6.5–8.1)

## 2024-01-24 LAB — CBC WITH DIFFERENTIAL (CANCER CENTER ONLY)
Abs Immature Granulocytes: 0.01 K/uL (ref 0.00–0.07)
Basophils Absolute: 0 K/uL (ref 0.0–0.1)
Basophils Relative: 1 %
Eosinophils Absolute: 0.3 K/uL (ref 0.0–0.5)
Eosinophils Relative: 4 %
HCT: 43.6 % (ref 36.0–46.0)
Hemoglobin: 15.2 g/dL — ABNORMAL HIGH (ref 12.0–15.0)
Immature Granulocytes: 0 %
Lymphocytes Relative: 28 %
Lymphs Abs: 1.8 K/uL (ref 0.7–4.0)
MCH: 32 pg (ref 26.0–34.0)
MCHC: 34.9 g/dL (ref 30.0–36.0)
MCV: 91.8 fL (ref 80.0–100.0)
Monocytes Absolute: 0.3 K/uL (ref 0.1–1.0)
Monocytes Relative: 5 %
Neutro Abs: 4.1 K/uL (ref 1.7–7.7)
Neutrophils Relative %: 62 %
Platelet Count: 151 K/uL (ref 150–400)
RBC: 4.75 MIL/uL (ref 3.87–5.11)
RDW: 13 % (ref 11.5–15.5)
WBC Count: 6.5 K/uL (ref 4.0–10.5)
nRBC: 0 % (ref 0.0–0.2)

## 2024-01-24 LAB — LACTATE DEHYDROGENASE: LDH: 180 U/L (ref 98–192)

## 2024-01-24 NOTE — Progress Notes (Signed)
 HEMATOLOGY/ONCOLOGY CLINIC VISIT NOTE  Date of Service: 01/24/2024  Patient Care Team: Kip Righter, MD as PCP - General (Family Medicine)  CHIEF COMPLAINTS/PURPOSE OF CONSULTATION:   Follow-up for continued evaluation and management of splenic marginal zone lymphoma  HISTORY OF PRESENTING ILLNESS:   Please see previous notes for details on initial presentation  INTERVAL HISTORY:  Joanne Miller is a 73 y.o. female who is here for continued evaluation and management of her splenic marginal zone lymphoma and her next cycle of maintenance rituximab  treatment.  Last seen by me on 11/22/2023 Patient was here for today for her last planned 6 maintenance dose of Rituxan  for her splenic marginal zone lymphoma.  She notes no new lumps or bumps or abdominal distention or pain.  Did have a recent respiratory infection and still recovering from this at this time. She received antibiotics from her primary care physician and has completed these. We discussed and decided to delay the Rituxan  to allow her for lately recover from her recent respiratory infection not cause this to worsen from immune suppression.SABRA  MEDICAL HISTORY:   #1 Dyslipidemia - not on any medications #2 GERD #3 hot flashes menopausal status was previously on Climara patch #4 history of sebaceous cysts. #5 history of recent left breast cysts - aspirated by ultrasound guidance and noted to be negative for cancer. #6 family history of breast cancer in her sister when she was in her 30s. No genetic testing done at that time.  SURGICAL HISTORY:  #1 breast cysts 2 aspiration on the left side . #2 root canal treatments #3 sebaceous cyst excision   SOCIAL HISTORY: Social History   Socioeconomic History   Marital status: Widowed    Spouse name: Not on file   Number of children: Not on file   Years of education: Not on file   Highest education level: Not on file  Occupational History   Not on file  Tobacco Use    Smoking status: Never   Smokeless tobacco: Never  Vaping Use   Vaping status: Never Used  Substance and Sexual Activity   Alcohol use: Yes    Alcohol/week: 1.0 standard drink of alcohol    Types: 1 Glasses of wine per week    Comment: occasional   Drug use: No   Sexual activity: Not on file  Other Topics Concern   Not on file  Social History Narrative   Not on file   Social Drivers of Health   Financial Resource Strain: Not on file  Food Insecurity: No Food Insecurity (02/23/2022)   Received from [facility]   Hunger Vital Sign  Transportation Needs: Not on file  Physical Activity: Not on file  Stress: Not on file  Social Connections: Not on file  Intimate Partner Violence: Not on file    FAMILY HISTORY: Family History  Problem Relation Age of Onset   Breast cancer Sister 44   Breast cancer Maternal Aunt        diagnosed in her 60-70s    ALLERGIES:  is allergic to amoxicillin-pot clavulanate, medroxyprogesterone acetate, ruxience  [rituximab -pvvr], and codeine.  MEDICATIONS:  Current Outpatient Medications  Medication Sig Dispense Refill   cholecalciferol (VITAMIN D3) 25 MCG (1000 UNIT) tablet Take 1,000 Units by mouth daily.     vitamin E 100 UNIT capsule Take by mouth daily.     No current facility-administered medications for this visit.    REVIEW OF SYSTEMS:   10 Point review of Systems was done  is negative except as noted above.  PHYSICAL EXAMINATION:  .BP 118/76 (BP Location: Left Arm, Patient Position: Sitting, Cuff Size: Normal)   Pulse 63   Temp (!) 97 F (36.1 C)   Resp 17   Ht 5' 3 (1.6 m)   Wt 139 lb (63 kg)   SpO2 97%   BMI 24.62 kg/m  NAD GENERAL:alert, in no acute distress and comfortable SKIN: no acute rashes, no significant lesions EYES: conjunctiva are pink and non-injected, sclera anicteric OROPHARYNX: MMM, no exudates, no oropharyngeal erythema or ulceration NECK: supple, no JVD LYMPH:  no palpable lymphadenopathy in the  cervical, axillary or inguinal regions LUNGS: clear to auscultation b/l with normal respiratory effort HEART: regular rate & rhythm ABDOMEN:  normoactive bowel sounds , non tender, not distended.  No palpable hepatosplenomegaly Extremity: no pedal edema PSYCH: alert & oriented x 3 with fluent speech NEURO: no focal motor/sensory deficits   LABORATORY DATA:  I have reviewed the data as listed  .    Latest Ref Rng & Units 01/24/2024   10:45 AM 11/22/2023    8:10 AM 09/18/2023    8:55 AM  CBC  WBC 4.0 - 10.5 K/uL 6.5  5.6  5.7   Hemoglobin 12.0 - 15.0 g/dL 84.7  84.7  84.3   Hematocrit 36.0 - 46.0 % 43.6  44.8  45.1   Platelets 150 - 400 K/uL 151  151  153    . CBC    Component Value Date/Time   WBC 6.5 01/24/2024 1045   WBC 6.7 03/02/2022 1738   RBC 4.75 01/24/2024 1045   HGB 15.2 (H) 01/24/2024 1045   HGB 14.0 03/22/2017 0950   HCT 43.6 01/24/2024 1045   HCT 41.9 03/22/2017 0950   PLT 151 01/24/2024 1045   PLT 136 (L) 03/22/2017 0950   MCV 91.8 01/24/2024 1045   MCV 93.3 03/22/2017 0950   MCH 32.0 01/24/2024 1045   MCHC 34.9 01/24/2024 1045   RDW 13.0 01/24/2024 1045   RDW 15.0 (H) 03/22/2017 0950   LYMPHSABS 1.8 01/24/2024 1045   LYMPHSABS 1.5 03/22/2017 0950   MONOABS 0.3 01/24/2024 1045   MONOABS 0.3 03/22/2017 0950   EOSABS 0.3 01/24/2024 1045   EOSABS 0.1 03/22/2017 0950   BASOSABS 0.0 01/24/2024 1045   BASOSABS 0.0 03/22/2017 0950    .    Latest Ref Rng & Units 01/24/2024   10:45 AM 11/22/2023    8:10 AM 09/18/2023    8:55 AM  CMP  Glucose 70 - 99 mg/dL 80  879  87   BUN 8 - 23 mg/dL 17  14  17    Creatinine 0.44 - 1.00 mg/dL 9.09  9.18  9.16   Sodium 135 - 145 mmol/L 143  142  141   Potassium 3.5 - 5.1 mmol/L 3.9  3.8  3.7   Chloride 98 - 111 mmol/L 109  108  106   CO2 22 - 32 mmol/L 26  27  26    Calcium 8.9 - 10.3 mg/dL 9.8  9.2  9.7   Total Protein 6.5 - 8.1 g/dL 6.8  6.5  7.2   Total Bilirubin 0.0 - 1.2 mg/dL 0.6  0.7  0.7   Alkaline Phos 38 - 126  U/L 76  82  91   AST 15 - 41 U/L 16  19  20    ALT 0 - 44 U/L 14  18  21     . Lab Results  Component Value Date   LDH  180 01/24/2024        Hematopathology Order10/03/2017 [facility]  BONE MARROW BIOPSY 01/25/2017: Interpretation Fourteen of the twenty metaphase cells analyzed are normal (46,XX). Six cells are abnormal and contain a deletion within the long arm of chromosome 7 and additional material of unknown origin attached to the long arm of chromosome 14 at or near the [facility] locus. Deletion of 7q is seen in mature B-cell neoplasms including marginal zone lymphoma. Ten cells were analyzed from the 24 hour unstimulated culture and ten cells were analyzed from the 72 hour culture stimulated with pokeweed mitogen, phorbol 12-myristate 13-acetate (PMA), and a CpG-oligodeoxynucleotide (ODN). The abnormal clone was only seen in the 72 hour stimulated culture.   Component Name Value Ref Range  Case Report Surgical Pathology Report                         Case: [id number]                                Authorizing Provider:  Asberry Nemiah Adjutant,  Collected:           01/25/2017 1100                                    AGNP                                                                        Ordering Location:     [facility]      Received:            01/25/2017 1118                                    [place]                                                                 Pathologist:           Corean Zulema Hoots,                                                                          MD  Specimens:   A) - Bone Marrow Right - Aspirate                                                                  B) - Bone Marrow Right - Biopsy                                                                    C) - Peripheral Blood                                                                    Final  Diagnosis Bone marrow, right iliac, aspiration and biopsy -  Hypercellular bone marrow (70%) with involvement by mature B-cell lymphoma with non-specific immunophenotype (representing approximately 20% of marrow cellularity by immunohistochemical analysis) (See Comment)  -  Cytogenetic studies are pending.     RADIOGRAPHIC STUDIES: I have personally reviewed the radiological images as listed and agreed with the findings in the report. MM Digital Diagnostic Unilat R Result Date: 01/10/2024 CLINICAL DATA:  Recall from screening for right breast calcifications. EXAM: DIGITAL DIAGNOSTIC UNILATERAL RIGHT MAMMOGRAM; ULTRASOUND RIGHT BREAST LIMITED TECHNIQUE: Right digital diagnostic mammography was performed. ; Targeted ultrasound examination of the right breast was performed COMPARISON:  Previous exam(s). ACR Breast Density Category c: The breasts are heterogeneously dense, which may obscure small masses. FINDINGS: On magnification imaging, the calcifications are seen to better advantage. There are mostly small and round, several amorphous, that project within the dependent aspect of a circumscribed right breast mass, previously shown to be a cyst. Targeted ultrasound is performed, showing a cyst in the right breast at 8 o'clock, 5 cm the nipple, containing echogenic mobile foci consistent with calcifications. These calcifications correspond to the calcifications seen on mammography and are consistent with benign intracystic calcifications. IMPRESSION: 1. No evidence of breast malignancy. 2. Benign right breast cyst at 8 o'clock containing calcifications. RECOMMENDATION: Screening mammogram in one year.(Code:SM-B-01Y) I have discussed the findings and recommendations with the patient. If applicable, a reminder letter will be sent to the patient regarding the next appointment. BI-RADS CATEGORY  2: Benign. Electronically Signed   By: Alm Parkins M.D.   On: 01/10/2024 10:51   US  LIMITED ULTRASOUND INCLUDING  AXILLA RIGHT BREAST Result Date: 01/10/2024 CLINICAL DATA:  Recall from screening for right breast calcifications. EXAM: DIGITAL DIAGNOSTIC UNILATERAL RIGHT MAMMOGRAM; ULTRASOUND RIGHT BREAST LIMITED TECHNIQUE: Right digital diagnostic mammography was performed. ; Targeted ultrasound examination of the right breast was performed COMPARISON:  Previous exam(s). ACR Breast Density Category c: The breasts are heterogeneously dense, which may obscure small masses. FINDINGS: On magnification imaging, the calcifications are seen to better advantage. There are mostly small and round, several amorphous, that project within the dependent aspect of a circumscribed right breast mass, previously shown to be a  cyst. Targeted ultrasound is performed, showing a cyst in the right breast at 8 o'clock, 5 cm the nipple, containing echogenic mobile foci consistent with calcifications. These calcifications correspond to the calcifications seen on mammography and are consistent with benign intracystic calcifications. IMPRESSION: 1. No evidence of breast malignancy. 2. Benign right breast cyst at 8 o'clock containing calcifications. RECOMMENDATION: Screening mammogram in one year.(Code:SM-B-01Y) I have discussed the findings and recommendations with the patient. If applicable, a reminder letter will be sent to the patient regarding the next appointment. BI-RADS CATEGORY  2: Benign. Electronically Signed   By: Alm Parkins M.D.   On: 01/10/2024 10:51   MM 3D SCREENING MAMMOGRAM BILATERAL BREAST Result Date: 12/30/2023 CLINICAL DATA:  Screening. EXAM: DIGITAL SCREENING BILATERAL MAMMOGRAM WITH TOMOSYNTHESIS AND CAD TECHNIQUE: Bilateral screening digital craniocaudal and mediolateral oblique mammograms were obtained. Bilateral screening digital breast tomosynthesis was performed. The images were evaluated with computer-aided detection. COMPARISON:  Previous exam(s). ACR Breast Density Category c: The breasts are heterogeneously dense,  which may obscure small masses. FINDINGS: In the right breast, calcifications warrant further evaluation with magnified views. In the left breast, no findings suspicious for malignancy. IMPRESSION: Further evaluation is suggested for calcifications in the right breast. RECOMMENDATION: Diagnostic mammogram of the right breast. (Code:[id number]-49M) The patient will be contacted regarding the findings, and additional imaging will be scheduled. BI-RADS CATEGORY  0: Incomplete: Need additional imaging evaluation. Electronically Signed   By: Curtistine Noble   On: 12/30/2023 11:39    ASSESSMENT & PLAN:   73 y.o. female with   1) history of splenic marginal zone lymphoma. Presented with splenomegaly and cytopenia with constitutional sypmtoms fatigue and night sweats - now resolved with Rituxan  treatment. HIV, hepatitis C and hepatitis B serologies unrevealing  SPEP shows no M spike and negative IFE.  BM Bx done at [facility] -  Hypercellular bone marrow (70%) with involvement by mature B-cell lymphoma with non-specific immunophenotype (representing approximately 20% of marrow cellularity by immunohistochemical analysis) Cytogenetics 7q deletion  2) Hot flashes grade 2  Patient declines consideration of medications for this.  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  Minimal and stable.  4) history of Rituxan  related Rash - grade 1. Resolved and did not recur with dose 2 and 3 and 4 with adjusted premedications.  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 Patient notes that her sister had breast cancer in her 30s . Has not had genetic testing . No other family history of breast cancer ovarian cancer pancreatic cancer or melanoma . She has had recent left breast cysts that were aspirated in [place] and per her report were noted to be benign . -yrly MMG with PCP (last done 12/31/2017) - negative   7) history of depression  was previously on Lexapro currently not on any medications . Lost her husband 2009 to throat cancer .  PLAN: Discussed lab results from today in detail with the patient CBC is stable with normal hemoglobin of 15.2, with normal WBC count and platelets CMP is unremarkable LDH within normal limits at 180 Patient has recent mammogram which showed some calcifications which were thought to be related to a cyst. Patient has no lab or clinical symptoms suggestive of splenic marginal zone lymphoma progression at this time. She is still recovering from a recent URI for which she has completed antibiotics. We discussed and decided to bring in her Rituxan  treatment by 4  weeks to allow her to completely recover from her respiratory infection. Continue follow-up with PCP for age-appropriate cancer screening and vaccinations  FOLLOW-UP:  Please reschedule patient's Rituxan  treatment from 01/24/2024 out to the first week of November with repeat labs and MD visit (since patient has a respiratory infection today)   The total time spent in the appointment was 30 minutes*.  All of the patient's questions were answered with apparent satisfaction. The patient knows to call the clinic with any problems, questions or concerns.   Emaline Saran MD MS AAHIVMS [facility] Hematology/Oncology Physician [facility]  .*Total Encounter Time as defined by the Centers for Medicare and Medicaid Services includes, in addition to the face-to-face time of a patient visit (documented in the note above) non-face-to-face time: obtaining and reviewing outside history, ordering and reviewing medications, tests or procedures, care coordination (communications with other health care professionals or caregivers) and documentation in the medical record.   I,Emily Lagle,acting as a Neurosurgeon for Emaline Saran, MD.,have documented all relevant documentation on the behalf of Emaline Saran, MD,as directed by  Emaline Saran, MD while in  the presence of Emaline Saran, MD.

## 2024-01-27 ENCOUNTER — Telehealth: Payer: Self-pay | Admitting: Hematology

## 2024-01-27 NOTE — Telephone Encounter (Signed)
 I LVM informing Kenli of her appts scheduled for 11/3.

## 2024-01-28 ENCOUNTER — Other Ambulatory Visit: Payer: Self-pay

## 2024-01-30 ENCOUNTER — Encounter: Payer: Self-pay | Admitting: Hematology

## 2024-02-07 DIAGNOSIS — H04123 Dry eye syndrome of bilateral lacrimal glands: Secondary | ICD-10-CM | POA: Diagnosis not present

## 2024-02-07 DIAGNOSIS — H02885 Meibomian gland dysfunction left lower eyelid: Secondary | ICD-10-CM | POA: Diagnosis not present

## 2024-02-07 DIAGNOSIS — H02882 Meibomian gland dysfunction right lower eyelid: Secondary | ICD-10-CM | POA: Diagnosis not present

## 2024-02-14 ENCOUNTER — Other Ambulatory Visit: Payer: Self-pay

## 2024-02-14 DIAGNOSIS — C8307 Small cell B-cell lymphoma, spleen: Secondary | ICD-10-CM

## 2024-02-17 ENCOUNTER — Inpatient Hospital Stay: Payer: Medicare (Managed Care) | Attending: Hematology

## 2024-02-17 ENCOUNTER — Inpatient Hospital Stay: Payer: Medicare (Managed Care)

## 2024-02-17 VITALS — BP 119/65 | HR 58 | Temp 97.6°F | Resp 16 | Wt 139.0 lb

## 2024-02-17 DIAGNOSIS — C8307 Small cell B-cell lymphoma, spleen: Secondary | ICD-10-CM | POA: Diagnosis not present

## 2024-02-17 DIAGNOSIS — Z7189 Other specified counseling: Secondary | ICD-10-CM

## 2024-02-17 DIAGNOSIS — Z79620 Long term (current) use of immunosuppressive biologic: Secondary | ICD-10-CM | POA: Diagnosis not present

## 2024-02-17 DIAGNOSIS — Z5112 Encounter for antineoplastic immunotherapy: Secondary | ICD-10-CM | POA: Diagnosis not present

## 2024-02-17 LAB — CMP (CANCER CENTER ONLY)
ALT: 15 U/L (ref 0–44)
AST: 18 U/L (ref 15–41)
Albumin: 4.3 g/dL (ref 3.5–5.0)
Alkaline Phosphatase: 86 U/L (ref 38–126)
Anion gap: 7 (ref 5–15)
BUN: 13 mg/dL (ref 8–23)
CO2: 26 mmol/L (ref 22–32)
Calcium: 9.1 mg/dL (ref 8.9–10.3)
Chloride: 107 mmol/L (ref 98–111)
Creatinine: 0.79 mg/dL (ref 0.44–1.00)
GFR, Estimated: >60 mL/min (ref >60)
Glucose, Bld: 106 mg/dL — ABNORMAL HIGH (ref 70–99)
Potassium: 3.8 mmol/L (ref 3.5–5.1)
Sodium: 140 mmol/L (ref 135–145)
Total Bilirubin: 0.5 mg/dL (ref 0.0–1.2)
Total Protein: 6.5 g/dL (ref 6.5–8.1)

## 2024-02-17 LAB — CBC WITH DIFFERENTIAL (CANCER CENTER ONLY)
Abs Immature Granulocytes: 0.01 K/uL (ref 0.00–0.07)
Basophils Absolute: 0 K/uL (ref 0.0–0.1)
Basophils Relative: 1 %
Eosinophils Absolute: 0.4 K/uL (ref 0.0–0.5)
Eosinophils Relative: 8 %
HCT: 43.6 % (ref 36.0–46.0)
Hemoglobin: 15.1 g/dL — ABNORMAL HIGH (ref 12.0–15.0)
Immature Granulocytes: 0 %
Lymphocytes Relative: 26 %
Lymphs Abs: 1.4 K/uL (ref 0.7–4.0)
MCH: 32.1 pg (ref 26.0–34.0)
MCHC: 34.6 g/dL (ref 30.0–36.0)
MCV: 92.6 fL (ref 80.0–100.0)
Monocytes Absolute: 0.3 K/uL (ref 0.1–1.0)
Monocytes Relative: 5 %
Neutro Abs: 3.3 K/uL (ref 1.7–7.7)
Neutrophils Relative %: 60 %
Platelet Count: 143 K/uL — ABNORMAL LOW (ref 150–400)
RBC: 4.71 MIL/uL (ref 3.87–5.11)
RDW: 13 % (ref 11.5–15.5)
WBC Count: 5.5 K/uL (ref 4.0–10.5)
nRBC: 0 % (ref 0.0–0.2)

## 2024-02-17 LAB — LACTATE DEHYDROGENASE: LDH: 163 U/L (ref 98–192)

## 2024-02-17 MED ORDER — FAMOTIDINE IN NACL 20-0.9 MG/50ML-% IV SOLN
20.0000 mg | Freq: Once | INTRAVENOUS | Status: AC
  Administered 2024-02-17: 20 mg via INTRAVENOUS
  Filled 2024-02-17: qty 50

## 2024-02-17 MED ORDER — ACETAMINOPHEN 325 MG PO TABS
650.0000 mg | ORAL_TABLET | Freq: Once | ORAL | Status: AC
  Administered 2024-02-17: 650 mg via ORAL
  Filled 2024-02-17: qty 2

## 2024-02-17 MED ORDER — CETIRIZINE HCL 10 MG/ML IV SOLN
10.0000 mg | Freq: Once | INTRAVENOUS | Status: AC
  Administered 2024-02-17: 10 mg via INTRAVENOUS
  Filled 2024-02-17: qty 1

## 2024-02-17 MED ORDER — SODIUM CHLORIDE 0.9 % IV SOLN
375.0000 mg/m2 | Freq: Once | INTRAVENOUS | Status: AC
  Administered 2024-02-17: 600 mg via INTRAVENOUS
  Filled 2024-02-17: qty 50

## 2024-02-17 MED ORDER — METHYLPREDNISOLONE SODIUM SUCC 125 MG IJ SOLR
125.0000 mg | Freq: Once | INTRAMUSCULAR | Status: AC
  Administered 2024-02-17: 125 mg via INTRAVENOUS
  Filled 2024-02-17: qty 2

## 2024-02-17 MED ORDER — SODIUM CHLORIDE 0.9 % IV SOLN: Freq: Once | INTRAVENOUS | Status: AC

## 2024-02-17 NOTE — Patient Instructions (Signed)
 [facility] - A DEPT OF [facility]  Discharge Instructions: Thank you for choosing [facility] to provide your oncology and hematology care.   If you have a lab appointment with the Cancer Center, please go directly to the Cancer Center and check in at the registration area.   Wear comfortable clothing and clothing appropriate for easy access to any Portacath or PICC line.   We strive to give you quality time with your provider. You may need to reschedule your appointment if you arrive late (15 or more minutes).  Arriving late affects you and other patients whose appointments are after yours.  Also, if you miss three or more appointments without notifying the office, you may be dismissed from the clinic at the provider's discretion.      For prescription refill requests, have your pharmacy contact our office and allow 72 hours for refills to be completed.    Today you received the following chemotherapy and/or immunotherapy agents rituximab        To help prevent nausea and vomiting after your treatment, we encourage you to take your nausea medication as directed.  BELOW ARE SYMPTOMS THAT SHOULD BE REPORTED IMMEDIATELY: *FEVER GREATER THAN 100.4 F (38 C) OR HIGHER *CHILLS OR SWEATING *NAUSEA AND VOMITING THAT IS NOT CONTROLLED WITH YOUR NAUSEA MEDICATION *UNUSUAL SHORTNESS OF BREATH *UNUSUAL BRUISING OR BLEEDING *URINARY PROBLEMS (pain or burning when urinating, or frequent urination) *BOWEL PROBLEMS (unusual diarrhea, constipation, pain near the anus) TENDERNESS IN MOUTH AND THROAT WITH OR WITHOUT PRESENCE OF ULCERS (sore throat, sores in mouth, or a toothache) UNUSUAL RASH, SWELLING OR PAIN  UNUSUAL VAGINAL DISCHARGE OR ITCHING   Items with * indicate a potential emergency and should be followed up as soon as possible or go to the Emergency Department if any problems should occur.  Please show the CHEMOTHERAPY ALERT CARD or IMMUNOTHERAPY  ALERT CARD at check-in to the Emergency Department and triage nurse.  Should you have questions after your visit or need to cancel or reschedule your appointment, please contact [facility] - A DEPT OF [facility]  Dept: [phone]  and follow the prompts.  Office hours are 8:00 a.m. to 4:30 p.m. Monday - Friday. Please note that voicemails left after 4:00 p.m. may not be returned until the following business day.  We are closed weekends and major holidays. You have access to a nurse at all times for urgent questions. Please call the main number to the clinic Dept: [phone] and follow the prompts.   For any non-urgent questions, you may also contact your provider using MyChart. We now offer e-Visits for anyone 77 and older to request care online for non-urgent symptoms. For details visit [email or web address].   Also download the MyChart app! Go to the app store, search MyChart, open the app, select [facility], and log in with your MyChart username and password.

## 2024-02-20 ENCOUNTER — Encounter: Payer: Self-pay | Admitting: Hematology

## 2024-02-21 ENCOUNTER — Other Ambulatory Visit: Payer: Self-pay

## 2024-02-25 ENCOUNTER — Other Ambulatory Visit: Payer: Self-pay | Admitting: Hematology

## 2024-02-25 ENCOUNTER — Encounter: Payer: Self-pay | Admitting: Hematology

## 2024-02-25 DIAGNOSIS — C8307 Small cell B-cell lymphoma, spleen: Secondary | ICD-10-CM

## 2024-02-25 NOTE — Progress Notes (Unsigned)
 CT abdomen without contrast in 2 weeks Return to clinic with Dr. Onesimo with labs in 4 weeks

## 2024-02-28 DIAGNOSIS — Z01419 Encounter for gynecological examination (general) (routine) without abnormal findings: Secondary | ICD-10-CM | POA: Diagnosis not present

## 2024-02-28 DIAGNOSIS — Z6825 Body mass index (BMI) 25.0-25.9, adult: Secondary | ICD-10-CM | POA: Diagnosis not present

## 2024-03-17 ENCOUNTER — Ambulatory Visit (HOSPITAL_COMMUNITY)
Admission: RE | Admit: 2024-03-17 | Discharge: 2024-03-17 | Disposition: A | Payer: Medicare (Managed Care) | Source: Ambulatory Visit | Attending: Hematology

## 2024-03-17 DIAGNOSIS — C884 Extranodal marginal zone b-cell lymphoma of mucosa-associated lymphoid tissue (malt-lymphoma) not having achieved remission: Secondary | ICD-10-CM | POA: Diagnosis not present

## 2024-03-17 DIAGNOSIS — C8307 Small cell B-cell lymphoma, spleen: Secondary | ICD-10-CM | POA: Diagnosis not present

## 2024-03-17 MED ORDER — IOHEXOL 9 MG/ML PO SOLN
500.0000 mL | ORAL | Status: AC
  Administered 2024-03-17 (×2): 500 mL via ORAL

## 2024-04-17 ENCOUNTER — Other Ambulatory Visit: Payer: Self-pay

## 2024-04-17 DIAGNOSIS — C8307 Small cell B-cell lymphoma, spleen: Secondary | ICD-10-CM

## 2024-04-20 ENCOUNTER — Inpatient Hospital Stay: Payer: Medicare (Managed Care)

## 2024-04-20 ENCOUNTER — Inpatient Hospital Stay: Payer: Medicare (Managed Care) | Attending: Hematology | Admitting: Hematology

## 2024-04-20 VITALS — BP 138/63 | HR 60 | Temp 97.9°F | Resp 16 | Ht 63.0 in | Wt 140.5 lb

## 2024-04-20 DIAGNOSIS — C8307 Small cell B-cell lymphoma, spleen: Secondary | ICD-10-CM

## 2024-04-20 LAB — CBC WITH DIFFERENTIAL (CANCER CENTER ONLY)
Abs Immature Granulocytes: 0.02 K/uL (ref 0.00–0.07)
Basophils Absolute: 0 K/uL (ref 0.0–0.1)
Basophils Relative: 1 %
Eosinophils Absolute: 0.1 K/uL (ref 0.0–0.5)
Eosinophils Relative: 2 %
HCT: 43.8 % (ref 36.0–46.0)
Hemoglobin: 15.1 g/dL — ABNORMAL HIGH (ref 12.0–15.0)
Immature Granulocytes: 0 %
Lymphocytes Relative: 23 %
Lymphs Abs: 1.6 K/uL (ref 0.7–4.0)
MCH: 31.9 pg (ref 26.0–34.0)
MCHC: 34.5 g/dL (ref 30.0–36.0)
MCV: 92.4 fL (ref 80.0–100.0)
Monocytes Absolute: 0.3 K/uL (ref 0.1–1.0)
Monocytes Relative: 5 %
Neutro Abs: 4.8 K/uL (ref 1.7–7.7)
Neutrophils Relative %: 69 %
Platelet Count: 160 K/uL (ref 150–400)
RBC: 4.74 MIL/uL (ref 3.87–5.11)
RDW: 12.8 % (ref 11.5–15.5)
WBC Count: 6.8 K/uL (ref 4.0–10.5)
nRBC: 0 % (ref 0.0–0.2)

## 2024-04-20 LAB — CMP (CANCER CENTER ONLY)
ALT: 26 U/L (ref 0–44)
AST: 27 U/L (ref 15–41)
Albumin: 4.8 g/dL (ref 3.5–5.0)
Alkaline Phosphatase: 88 U/L (ref 38–126)
Anion gap: 14 (ref 5–15)
BUN: 13 mg/dL (ref 8–23)
CO2: 25 mmol/L (ref 22–32)
Calcium: 9.7 mg/dL (ref 8.9–10.3)
Chloride: 103 mmol/L (ref 98–111)
Creatinine: 0.83 mg/dL (ref 0.44–1.00)
GFR, Estimated: >60 mL/min
Glucose, Bld: 81 mg/dL (ref 70–99)
Potassium: 3.6 mmol/L (ref 3.5–5.1)
Sodium: 141 mmol/L (ref 135–145)
Total Bilirubin: 0.5 mg/dL (ref 0.0–1.2)
Total Protein: 7 g/dL (ref 6.5–8.1)

## 2024-04-20 LAB — LACTATE DEHYDROGENASE: LDH: 204 U/L (ref 105–235)

## 2024-04-20 NOTE — Progress Notes (Signed)
 " HEMATOLOGY ONCOLOGY PROGRESS NOTE  Date of service: 04/20/2024  Patient Care Team: Kip Righter, MD as PCP - General (Family Medicine)  CHIEF COMPLAINT/PURPOSE OF CONSULTATION: Follow-up for continued evaluation and management of splenic marginal zone lymphoma   HISTORY OF PRESENTING ILLNESS: Please see previous notes for details on initial presentation    SUMMARY OF ONCOLOGIC HISTORY: Oncology History  Splenic marginal zone b-cell lymphoma (HCC)  02/11/2017 Initial Diagnosis   Splenic marginal zone b-cell lymphoma (HCC)   10/31/2022 -  Chemotherapy   Patient is on Treatment Plan : NON-HODGKINS LYMPHOMA Rituximab  Q 2 MONTHS       INTERVAL HISTORY:  Joanne Miller is a 74 y.o. female who is here today for continued evaluation and management of splenic marginal zone lymphoma .   she was last seen by me on 01/24/2024; at the time she did not have any concerns and was doing well.   Today, she notes feeling well overall.  No fevers no chills no night sweats no unexpected weight loss. No distention. No new fatigue/bleeding issues/infections. She is up to date with her screenings and vaccinations.    REVIEW OF SYSTEMS:   10 Point review of systems of done and is negative except as noted above.  MEDICAL HISTORY Past Medical History:  Diagnosis Date   Arthritis    left hand   BRCA gene mutation negative in female    Cancer [facility]) 2018   splenic lymphoma   H/O bladder infections    History of kidney stones    74 years old    SURGICAL HISTORY Past Surgical History:  Procedure Laterality Date   BREAST BIOPSY Bilateral    BREAST CYST ASPIRATION Left    COLONOSCOPY     age 34, 31   LAPAROSCOPIC SALPINGO OOPHERECTOMY Bilateral 10/30/2017   Procedure: LAPAROSCOPIC BILATERAL SALPINGO OOPHORECTOMY, WITH PELVIC WASHINGS;  Surgeon: Ozan, Jennifer, DO;  Location: [facility];  Service: Gynecology;  Laterality: Bilateral;    SOCIAL HISTORY Social History[1]  Social History    Social History Narrative   Not on file    SOCIAL DRIVERS OF HEALTH SDOH Screenings   Food Insecurity: No Food Insecurity (02/23/2022)   Received from [facility]  Depression (PHQ2-9): Low Risk (04/20/2024)  Tobacco Use: Low Risk (01/04/2024)   Received from [facility]     FAMILY HISTORY Family History  Problem Relation Age of Onset   Breast cancer Sister 12   Breast cancer Maternal Aunt        diagnosed in her 60-70s     ALLERGIES: is allergic to amoxicillin-pot clavulanate, medroxyprogesterone acetate, ruxience  [rituximab -pvvr], and codeine.  MEDICATIONS  Current Outpatient Medications  Medication Sig Dispense Refill   cholecalciferol (VITAMIN D3) 25 MCG (1000 UNIT) tablet Take 1,000 Units by mouth daily.     vitamin E 100 UNIT capsule Take by mouth daily.     No current facility-administered medications for this visit.    PHYSICAL EXAMINATION: ECOG PERFORMANCE STATUS: 0 - Asymptomatic VITALS: Vitals:   04/20/24 1119  BP: 138/63  Pulse: 60  Resp: 16  Temp: 97.9 F (36.6 C)  SpO2: 100%   Filed Weights   04/20/24 1119  Weight: 140 lb 8 oz (63.7 kg)   Body mass index is 24.89 kg/m.  GENERAL: alert, in no acute distress and comfortable SKIN: no acute rashes, no significant lesions EYES: conjunctiva are pink and non-injected, sclera anicteric OROPHARYNX: MMM, no exudates, no oropharyngeal erythema or ulceration NECK: supple, no JVD LYMPH:  no  palpable lymphadenopathy in the cervical, axillary or inguinal regions LUNGS: clear to auscultation b/l with normal respiratory effort HEART: regular rate & rhythm ABDOMEN:  normoactive bowel sounds , non tender, not distended, no hepatosplenomegaly Extremity: no pedal edema PSYCH: alert & oriented x 3 with fluent speech NEURO: no focal motor/sensory deficits  LABORATORY DATA:   I have reviewed the data as listed     Latest Ref Rng & Units 04/20/2024   10:46 AM 02/17/2024    9:47 AM 01/24/2024   10:45 AM   CBC EXTENDED  WBC 4.0 - 10.5 K/uL 6.8  5.5  6.5   RBC 3.87 - 5.11 MIL/uL 4.74  4.71  4.75   Hemoglobin 12.0 - 15.0 g/dL 84.8  84.8  84.7   HCT 36.0 - 46.0 % 43.8  43.6  43.6   Platelets 150 - 400 K/uL 160  143  151   NEUT# 1.7 - 7.7 K/uL 4.8  3.3  4.1   Lymph# 0.7 - 4.0 K/uL 1.6  1.4  1.8         Latest Ref Rng & Units 04/20/2024   10:46 AM 02/17/2024    9:47 AM 01/24/2024   10:45 AM  CMP  Glucose 70 - 99 mg/dL 81  893  80   BUN 8 - 23 mg/dL 13  13  17    Creatinine 0.44 - 1.00 mg/dL 9.16  9.20  9.09   Sodium 135 - 145 mmol/L 141  140  143   Potassium 3.5 - 5.1 mmol/L 3.6  3.8  3.9   Chloride 98 - 111 mmol/L 103  107  109   CO2 22 - 32 mmol/L 25  26  26    Calcium 8.9 - 10.3 mg/dL 9.7  9.1  9.8   Total Protein 6.5 - 8.1 g/dL 7.0  6.5  6.8   Total Bilirubin 0.0 - 1.2 mg/dL 0.5  0.5  0.6   Alkaline Phos 38 - 126 U/L 88  86  76   AST 15 - 41 U/L 27  18  16    ALT 0 - 44 U/L 26  15  14     Lactate Dehydrogenase Component     Latest Ref Rng 04/20/2024  LDH     105 - 235 U/L 204               Hematopathology Order10/03/2017 [facility]   BONE MARROW BIOPSY 01/25/2017: Interpretation Fourteen of the twenty metaphase cells analyzed are normal (46,XX). Six cells are abnormal and contain a deletion within the long arm of chromosome 7 and additional material of unknown origin attached to the long arm of chromosome 14 at or near the [facility] locus. Deletion of 7q is seen in mature B-cell neoplasms including marginal zone lymphoma. Ten cells were analyzed from the 24 hour unstimulated culture and ten cells were analyzed from the 72 hour culture stimulated with pokeweed mitogen, phorbol 12-myristate 13-acetate (PMA), and a CpG-oligodeoxynucleotide (ODN). The abnormal clone was only seen in the 72 hour stimulated culture.    Component Name Value Ref Range  Case Report Surgical Pathology Report                         Case: [id number]                                Authorizing Provider:   Asberry Nemiah Adjutant,  Collected:  01/25/2017 1100                                    AGNP                                                                        Ordering Location:     [facility]      Received:            01/25/2017 1118                                    [place]                                                                 Pathologist:           Corean Zulema Hoots,                                                                          MD                                                                          Specimens:   A) - Bone Marrow Right - Aspirate                                                                  B) - Bone Marrow Right - Biopsy                                                                    C) - Peripheral Blood  Final Diagnosis Bone marrow, right iliac, aspiration and biopsy -  Hypercellular bone marrow (70%) with involvement by mature B-cell lymphoma with non-specific immunophenotype (representing approximately 20% of marrow cellularity by immunohistochemical analysis) (See Comment)  -  Cytogenetic studies are pending.       RADIOGRAPHIC STUDIES: I have personally reviewed the radiological images as listed and agreed with the findings in the report. CT ABDOMEN WO CONTRAST Result Date: 03/25/2024 CLINICAL DATA:  Marginal zone lymphoma.  Splenomegaly. EXAM: CT ABDOMEN WITHOUT CONTRAST TECHNIQUE: Multidetector CT imaging of the abdomen was performed following the standard protocol without IV contrast. RADIATION DOSE REDUCTION: This exam was performed according to the departmental dose-optimization program which includes automated exposure control, adjustment of the mA and/or kV according to patient size and/or use of iterative reconstruction technique. COMPARISON:  01/12/2023 FINDINGS: Lower chest: Subsegmental atelectasis or linear scarring noted in the  lower chest. Hepatobiliary: Scattered tiny hypodensities in the liver parenchyma are too small to characterize but are statistically most likely benign. No followup imaging is recommended. There is no evidence for gallstones, gallbladder wall thickening, or pericholecystic fluid. No intrahepatic or extrahepatic biliary dilation. Pancreas: No focal mass lesion. No dilatation of the main duct. No intraparenchymal cyst. No peripancreatic edema. Spleen: Spleen measures 10.7 x 4.8 x 8.3 cm. Estimated 223 cc volume. Adrenals/Urinary Tract: No adrenal nodule or mass. Prominent extrarenal pelvis in each kidney without overt hydronephrosis. Stomach/Bowel: Stomach is unremarkable. No gastric wall thickening. No evidence of outlet obstruction.No small bowel or colonic dilatation within the visualized abdomen. Vascular/Lymphatic: There is mild atherosclerotic calcification of the abdominal aorta without aneurysm. There is no gastrohepatic or hepatoduodenal ligament lymphadenopathy. No retroperitoneal or mesenteric lymphadenopathy. Other: No intraperitoneal free fluid. Musculoskeletal: No worrisome lytic or sclerotic osseous abnormality. IMPRESSION: 1. No evidence for lymphadenopathy in the abdomen. 2. Spleen measures 10.7 x 4.8 x 8.3 cm with estimated volume of 223 cc. 3.  Aortic Atherosclerosis (ICD10-I70.0). Electronically Signed   By: Camellia Candle M.D.   On: 03/25/2024 05:43    ASSESSMENT & PLAN:  74 y.o. female with  1) history of splenic marginal zone lymphoma. Presented with splenomegaly and cytopenia with constitutional sypmtoms fatigue and night sweats - now resolved with Rituxan  treatment. HIV, hepatitis C and hepatitis B serologies unrevealing  SPEP shows no M spike and negative IFE.   BM Bx done at [facility] -  Hypercellular bone marrow (70%) with involvement by mature B-cell lymphoma with non-specific immunophenotype (representing approximately 20% of marrow cellularity by immunohistochemical  analysis) Cytogenetics 7q deletion   2) Hot flashes grade 2  Patient declines consideration of medications for this.   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  Minimal and stable.   4) history of Rituxan  related Rash - grade 1. Resolved and did not recur with dose 2 and 3 and 4 with adjusted premedications.   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 Patient notes that her sister had breast cancer in her 30s . Has not had genetic testing . No other family history of breast cancer ovarian cancer pancreatic cancer or melanoma . She has had recent left breast cysts that were aspirated in [place] and per her report were noted to be benign . -yrly MMG with PCP (last done 12/31/2017) - negative    7) history of depression was previously on Lexapro currently not on any medications . Lost her husband 2009 to throat cancer .  PLAN: - Discussed lab results on 04/20/2024 in detail with patient: - CMP   - Creatinine:  0.83 - Calcium: 9.7 - CBC  - Hemoglobin: 15.1  - WBC: 6.8  - Platelets: 160 - LDH: 204  - Discussed CT results in detail with patient  - Spleen has shrunk in size to normal volume - benign cysts reported in liver.  - No clinical or radiographic evidence of persistent/progressive splenic marginal zone lymphoma at this time. - No indication for additional treatment of her splenic marginal zone lymphoma at this time and will cotninue active surveillance.  FOLLOW-UP in 6 months for labs and follow-up with Dr. Onesimo.  The total time spent in the appointment was 25 minutes* .  All of the patient's questions were answered and the patient knows to call the clinic with any problems, questions, or concerns.  Emaline Onesimo MD MS AAHIVMS [facility] Hematology/Oncology Physician [facility]  *Total Encounter Time as defined by the Centers for Medicare and Medicaid Services includes, in  addition to the face-to-face time of a patient visit (documented in the note above) non-face-to-face time: obtaining and reviewing outside history, ordering and reviewing medications, tests or procedures, care coordination (communications with other health care professionals or caregivers) and documentation in the medical record.  I, Marijo Sharps, acting as a neurosurgeon for Emaline Onesimo, MD.,have documented all relevant documentation on the behalf of Emaline Onesimo, MD,as directed by  Emaline Onesimo, MD while in the presence of Emaline Onesimo, MD.  I have reviewed the above documentation for accuracy and completeness, and I agree with the above.  Emaline Onesimo, MD      [1]  Social History Tobacco Use   Smoking status: Never   Smokeless tobacco: Never  Vaping Use   Vaping status: Never Used  Substance Use Topics   Alcohol use: Yes    Alcohol/week: 1.0 standard drink of alcohol    Types: 1 Glasses of wine per week    Comment: occasional   Drug use: No   "

## 2024-04-22 ENCOUNTER — Other Ambulatory Visit: Payer: Self-pay

## 2024-04-30 ENCOUNTER — Telehealth: Payer: Self-pay | Admitting: Hematology

## 2024-04-30 NOTE — Telephone Encounter (Signed)
 Patient left a voicemail needing to reschedule her appts in July. I called and spoke with the patient and rescheduled her appointments, she is aware.

## 2024-05-01 ENCOUNTER — Other Ambulatory Visit: Payer: Self-pay

## 2024-10-20 ENCOUNTER — Inpatient Hospital Stay: Payer: Medicare (Managed Care)

## 2024-10-20 ENCOUNTER — Inpatient Hospital Stay: Payer: Medicare (Managed Care) | Admitting: Hematology

## 2024-11-10 ENCOUNTER — Inpatient Hospital Stay: Payer: Medicare (Managed Care)

## 2024-11-10 ENCOUNTER — Inpatient Hospital Stay: Payer: Medicare (Managed Care) | Admitting: Hematology
